# Patient Record
Sex: Female | Born: 1955
Health system: Southern US, Community
[De-identification: ages and names within clinical notes are randomized; demographics above are authoritative.]

## PROBLEM LIST (undated history)

## (undated) DIAGNOSIS — N950 Postmenopausal bleeding: Secondary | ICD-10-CM

## (undated) DIAGNOSIS — Z923 Personal history of irradiation: Secondary | ICD-10-CM

## (undated) DIAGNOSIS — K219 Gastro-esophageal reflux disease without esophagitis: Secondary | ICD-10-CM

## (undated) DIAGNOSIS — Z1211 Encounter for screening for malignant neoplasm of colon: Secondary | ICD-10-CM

## (undated) DIAGNOSIS — J45909 Unspecified asthma, uncomplicated: Secondary | ICD-10-CM

## (undated) DIAGNOSIS — E669 Obesity, unspecified: Secondary | ICD-10-CM

## (undated) DIAGNOSIS — I10 Essential (primary) hypertension: Secondary | ICD-10-CM

## (undated) DIAGNOSIS — C50419 Malignant neoplasm of upper-outer quadrant of unspecified female breast: Secondary | ICD-10-CM

## (undated) DIAGNOSIS — C50919 Malignant neoplasm of unspecified site of unspecified female breast: Secondary | ICD-10-CM

## (undated) HISTORY — DX: Obesity, unspecified: E66.9

## (undated) HISTORY — DX: Encounter for screening for malignant neoplasm of colon: Z12.11

## (undated) HISTORY — DX: Personal history of irradiation: Z92.3

## (undated) HISTORY — DX: Essential (primary) hypertension: I10

## (undated) HISTORY — DX: Postmenopausal bleeding: N95.0

## (undated) HISTORY — DX: Malignant neoplasm of upper-outer quadrant of unspecified female breast: C50.419

## (undated) HISTORY — DX: Unspecified asthma, uncomplicated: J45.909

## (undated) HISTORY — DX: Gastro-esophageal reflux disease without esophagitis: K21.9

## (undated) HISTORY — PX: BREAST SURGERY: SHX581

---

## 2004-04-14 ENCOUNTER — Ambulatory Visit: Payer: Self-pay | Admitting: General Surgery

## 2004-05-09 ENCOUNTER — Ambulatory Visit: Payer: Self-pay | Admitting: General Surgery

## 2006-03-06 DIAGNOSIS — K219 Gastro-esophageal reflux disease without esophagitis: Secondary | ICD-10-CM

## 2006-03-06 HISTORY — PX: APPENDECTOMY: SHX54

## 2006-03-06 HISTORY — DX: Gastro-esophageal reflux disease without esophagitis: K21.9

## 2006-03-15 ENCOUNTER — Ambulatory Visit: Payer: Self-pay | Admitting: Internal Medicine

## 2006-05-30 ENCOUNTER — Ambulatory Visit: Payer: Self-pay | Admitting: Gastroenterology

## 2006-06-11 ENCOUNTER — Ambulatory Visit: Payer: Self-pay | Admitting: General Surgery

## 2006-06-11 ENCOUNTER — Other Ambulatory Visit: Payer: Self-pay

## 2006-06-13 ENCOUNTER — Inpatient Hospital Stay: Payer: Self-pay | Admitting: General Surgery

## 2006-06-13 HISTORY — PX: COLON SURGERY: SHX602

## 2006-07-02 ENCOUNTER — Emergency Department: Payer: Self-pay | Admitting: General Practice

## 2006-07-11 ENCOUNTER — Inpatient Hospital Stay: Payer: Self-pay | Admitting: General Surgery

## 2006-10-01 ENCOUNTER — Ambulatory Visit: Payer: Self-pay | Admitting: General Surgery

## 2007-12-04 ENCOUNTER — Ambulatory Visit: Payer: Self-pay | Admitting: Internal Medicine

## 2008-02-10 ENCOUNTER — Ambulatory Visit: Payer: Self-pay | Admitting: Internal Medicine

## 2008-03-03 ENCOUNTER — Ambulatory Visit: Payer: Self-pay | Admitting: Internal Medicine

## 2008-07-09 ENCOUNTER — Ambulatory Visit: Payer: Self-pay | Admitting: Unknown Physician Specialty

## 2008-09-01 ENCOUNTER — Ambulatory Visit: Payer: Self-pay | Admitting: Unknown Physician Specialty

## 2009-03-06 DIAGNOSIS — C50419 Malignant neoplasm of upper-outer quadrant of unspecified female breast: Secondary | ICD-10-CM

## 2009-03-06 HISTORY — DX: Malignant neoplasm of upper-outer quadrant of unspecified female breast: C50.419

## 2009-03-06 HISTORY — PX: BREAST BIOPSY: SHX20

## 2009-11-16 ENCOUNTER — Ambulatory Visit: Payer: Self-pay | Admitting: Unknown Physician Specialty

## 2009-11-19 ENCOUNTER — Ambulatory Visit: Payer: Self-pay | Admitting: Unknown Physician Specialty

## 2010-01-25 ENCOUNTER — Ambulatory Visit: Payer: Self-pay | Admitting: General Surgery

## 2010-02-03 DIAGNOSIS — C50919 Malignant neoplasm of unspecified site of unspecified female breast: Secondary | ICD-10-CM

## 2010-02-03 HISTORY — DX: Malignant neoplasm of unspecified site of unspecified female breast: C50.919

## 2010-02-03 HISTORY — PX: BREAST LUMPECTOMY: SHX2

## 2010-02-17 ENCOUNTER — Ambulatory Visit: Payer: Self-pay | Admitting: General Surgery

## 2010-02-19 LAB — PATHOLOGY REPORT

## 2010-03-06 DIAGNOSIS — Z923 Personal history of irradiation: Secondary | ICD-10-CM

## 2010-03-06 HISTORY — DX: Personal history of irradiation: Z92.3

## 2010-03-10 ENCOUNTER — Ambulatory Visit: Payer: Self-pay | Admitting: General Surgery

## 2010-03-14 LAB — PATHOLOGY REPORT

## 2010-03-30 ENCOUNTER — Ambulatory Visit: Payer: Self-pay | Admitting: Radiation Oncology

## 2010-04-06 ENCOUNTER — Ambulatory Visit: Payer: Self-pay | Admitting: Radiation Oncology

## 2010-05-05 ENCOUNTER — Ambulatory Visit: Payer: Self-pay | Admitting: Radiation Oncology

## 2010-06-05 ENCOUNTER — Ambulatory Visit: Payer: Self-pay | Admitting: Radiation Oncology

## 2010-07-05 ENCOUNTER — Ambulatory Visit: Payer: Self-pay | Admitting: Radiation Oncology

## 2010-08-05 ENCOUNTER — Ambulatory Visit: Payer: Self-pay | Admitting: Radiation Oncology

## 2010-09-04 ENCOUNTER — Ambulatory Visit: Payer: Self-pay | Admitting: Internal Medicine

## 2011-01-09 ENCOUNTER — Ambulatory Visit: Payer: Self-pay | Admitting: General Surgery

## 2011-02-06 ENCOUNTER — Ambulatory Visit: Payer: Self-pay | Admitting: Internal Medicine

## 2011-03-07 ENCOUNTER — Ambulatory Visit: Payer: Self-pay | Admitting: Internal Medicine

## 2011-03-07 DIAGNOSIS — N950 Postmenopausal bleeding: Secondary | ICD-10-CM

## 2011-03-07 HISTORY — DX: Postmenopausal bleeding: N95.0

## 2011-07-17 ENCOUNTER — Ambulatory Visit: Payer: Self-pay | Admitting: General Surgery

## 2011-09-26 ENCOUNTER — Ambulatory Visit: Payer: Self-pay | Admitting: Internal Medicine

## 2011-09-26 LAB — CBC CANCER CENTER
Basophil #: 0 x10 3/mm (ref 0.0–0.1)
Eosinophil #: 0.2 x10 3/mm (ref 0.0–0.7)
MCH: 29.4 pg (ref 26.0–34.0)
MCHC: 32.6 g/dL (ref 32.0–36.0)
Monocyte #: 0.3 x10 3/mm (ref 0.2–0.9)
Monocyte %: 8.3 %
Neutrophil #: 2.3 x10 3/mm (ref 1.4–6.5)
Neutrophil %: 59.2 %
Platelet: 179 x10 3/mm (ref 150–440)

## 2011-09-26 LAB — HEPATIC FUNCTION PANEL A (ARMC)
Albumin: 3.3 g/dL — ABNORMAL LOW (ref 3.4–5.0)
Bilirubin, Direct: 0.1 mg/dL (ref 0.00–0.20)
Total Protein: 7 g/dL (ref 6.4–8.2)

## 2011-09-26 LAB — CREATININE, SERUM
Creatinine: 0.82 mg/dL (ref 0.60–1.30)
EGFR (African American): >60

## 2011-10-05 ENCOUNTER — Ambulatory Visit: Payer: Self-pay | Admitting: Internal Medicine

## 2012-01-18 ENCOUNTER — Ambulatory Visit: Payer: Self-pay | Admitting: General Surgery

## 2012-02-14 ENCOUNTER — Ambulatory Visit: Payer: Self-pay | Admitting: Obstetrics and Gynecology

## 2012-02-14 LAB — CBC WITH DIFFERENTIAL/PLATELET
Basophil #: 0 10*3/uL (ref 0.0–0.1)
Basophil %: 0.4 %
Eosinophil #: 0.2 10*3/uL (ref 0.0–0.7)
Eosinophil %: 4.2 %
HGB: 12.6 g/dL (ref 12.0–16.0)
Lymphocyte #: 1.2 10*3/uL (ref 1.0–3.6)
Lymphocyte %: 26.2 %
MCH: 29.6 pg (ref 26.0–34.0)
MCHC: 33.7 g/dL (ref 32.0–36.0)
MCV: 88 fL (ref 80–100)
Monocyte #: 0.4 x10 3/mm (ref 0.2–0.9)
Neutrophil %: 59.9 %
RBC: 4.24 10*6/uL (ref 3.80–5.20)
RDW: 13.1 % (ref 11.5–14.5)

## 2012-02-14 LAB — BASIC METABOLIC PANEL
Calcium, Total: 8.6 mg/dL (ref 8.5–10.1)
Chloride: 104 mmol/L (ref 98–107)
Co2: 29 mmol/L (ref 21–32)
Creatinine: 0.71 mg/dL (ref 0.60–1.30)
EGFR (African American): >60
EGFR (Non-African Amer.): >60
Glucose: 84 mg/dL (ref 65–99)
Potassium: 3.6 mmol/L (ref 3.5–5.1)
Sodium: 139 mmol/L (ref 136–145)

## 2012-02-15 ENCOUNTER — Ambulatory Visit: Payer: Self-pay | Admitting: Obstetrics and Gynecology

## 2012-02-19 LAB — PATHOLOGY REPORT

## 2012-03-06 ENCOUNTER — Ambulatory Visit: Payer: Self-pay | Admitting: Internal Medicine

## 2012-03-26 LAB — CBC CANCER CENTER
Basophil %: 0.7 %
Eosinophil %: 4.1 %
HCT: 39 % (ref 35.0–47.0)
HGB: 13.4 g/dL (ref 12.0–16.0)
Lymphocyte %: 28.1 %
MCH: 29.6 pg (ref 26.0–34.0)
MCHC: 34.4 g/dL (ref 32.0–36.0)
Monocyte #: 0.4 x10 3/mm (ref 0.2–0.9)
Neutrophil %: 58.5 %
Platelet: 175 x10 3/mm (ref 150–440)

## 2012-03-26 LAB — CREATININE, SERUM
Creatinine: 0.68 mg/dL (ref 0.60–1.30)
EGFR (African American): >60
EGFR (Non-African Amer.): >60

## 2012-03-26 LAB — HEPATIC FUNCTION PANEL A (ARMC)
Albumin: 3.4 g/dL (ref 3.4–5.0)
Bilirubin, Direct: 0.1 mg/dL (ref 0.00–0.20)
Bilirubin,Total: 0.4 mg/dL (ref 0.2–1.0)
SGOT(AST): 18 U/L (ref 15–37)
SGPT (ALT): 19 U/L (ref 12–78)
Total Protein: 7.3 g/dL (ref 6.4–8.2)

## 2012-04-06 ENCOUNTER — Ambulatory Visit: Payer: Self-pay | Admitting: Internal Medicine

## 2012-05-15 ENCOUNTER — Ambulatory Visit: Payer: Self-pay | Admitting: Physician Assistant

## 2012-08-02 ENCOUNTER — Other Ambulatory Visit: Payer: Self-pay | Admitting: General Surgery

## 2012-08-20 ENCOUNTER — Encounter: Payer: Self-pay | Admitting: *Deleted

## 2012-08-20 DIAGNOSIS — C50419 Malignant neoplasm of upper-outer quadrant of unspecified female breast: Secondary | ICD-10-CM | POA: Insufficient documentation

## 2012-09-11 ENCOUNTER — Ambulatory Visit: Payer: Self-pay | Admitting: Internal Medicine

## 2012-10-27 ENCOUNTER — Other Ambulatory Visit: Payer: Self-pay | Admitting: General Surgery

## 2013-01-20 ENCOUNTER — Ambulatory Visit: Payer: Self-pay | Admitting: General Surgery

## 2013-01-21 ENCOUNTER — Encounter: Payer: Self-pay | Admitting: General Surgery

## 2013-02-11 ENCOUNTER — Encounter: Payer: Self-pay | Admitting: General Surgery

## 2013-02-11 ENCOUNTER — Ambulatory Visit (INDEPENDENT_AMBULATORY_CARE_PROVIDER_SITE_OTHER): Payer: BC Managed Care – PPO | Admitting: General Surgery

## 2013-02-11 VITALS — BP 136/80 | HR 72 | Resp 14 | Ht 59.0 in | Wt 162.0 lb

## 2013-02-11 DIAGNOSIS — Z853 Personal history of malignant neoplasm of breast: Secondary | ICD-10-CM

## 2013-02-11 NOTE — Patient Instructions (Addendum)
Patient to return in 6 months bilateral diagnotic mammogram 

## 2013-02-11 NOTE — Progress Notes (Signed)
Patient ID: Stacie Ellis, female   DOB: 1955-08-22, 57 y.o.   MRN: [id number]  Chief Complaint  Patient presents with  . Follow-up    mammogram    HPI Stacie Ellis is a 57 y.o. female who presents for a breast evaluation. The most recent mammogram was done on 01/20/13.Patient does perform regular self breast checks and gets regular mammograms done.  Patient states she is having lots of tenderness in her left breast .Patient states in the last six months she has had  more  hot flashes while on tamoxifen .  HPI  Past Medical History  Diagnosis Date  . Hypertension   . Postmenopausal bleeding 2013  . GERD (gastroesophageal reflux disease) 2008  . Obesity, unspecified   . Special screening for malignant neoplasms, colon   . Malignant neoplasm of upper-outer quadrant of female breast 2011    left breast lumpectomy, wide excision and a repeat wide excision on 03/10/2010 for multiple positive margins on original resection  . S/P radiation therapy 2012    whole breast,   . Malignant neoplasm of upper-outer quadrant of female breast 2011    left breast  . Asthma     Past Surgical History  Procedure Laterality Date  . Cesarean section    . Breast surgery Left 02/17/2010;03/10/2010    lumpectomy and repeat wide excision  . Appendectomy  2008  . Colonoscopy  2010    Dr. Mechele Collin  . Colon surgery Right 2008    hemicolectomy/polyp    Family History  Problem Relation Age of Onset  . Cancer Father     colon  . Cancer Other     unknown family member with breast cancer  . Colon polyps Other     unknown family member with colon polyps    Social History History  Substance Use Topics  . Smoking status: Never Smoker   . Smokeless tobacco: Never Used  . Alcohol Use: No    Allergies  Allergen Reactions  . Levaquin [Levofloxacin] Other (See Comments)    Severe tightness in left leg  . Penicillins Rash    Current Outpatient Prescriptions  Medication Sig Dispense Refill   . aspirin 81 MG tablet Take 81 mg by mouth daily.      Marland Kitchen loratadine (CLARITIN) 10 MG tablet Take 10 mg by mouth daily.      . pantoprazole (PROTONIX) 20 MG tablet Take 20 mg by mouth daily.      . tamoxifen (NOLVADEX) 20 MG tablet take 1 tablet by mouth once daily  90 tablet  4  . valsartan-hydrochlorothiazide (DIOVAN-HCT) 160-12.5 MG per tablet Take 1 tablet by mouth daily.       No current facility-administered medications for this visit.    Review of Systems Review of Systems  Constitutional: Negative.   Respiratory: Negative.   Cardiovascular: Negative.     Blood pressure 136/80, pulse 72, resp. rate 14, height 4\' 11"  (1.499 m), weight 162 lb (73.483 kg).  Physical Exam Physical Exam  Constitutional: She is oriented to person, place, and time. She appears well-developed and well-nourished.  Eyes: No scleral icterus.  Cardiovascular: Normal rate, regular rhythm and normal heart sounds.   Pulmonary/Chest: Breath sounds normal. Right breast exhibits no inverted nipple, no mass, no nipple discharge, no skin change and no tenderness. Left breast exhibits no inverted nipple, no mass, no nipple discharge, no skin change and no tenderness. Breasts are asymmetrical (left breast 2 cups size smaller than right.).  Thickening on  the left anterior  fold.Well healed left breast scar at 3 o'clock.  Abdominal: Soft. Bowel sounds are normal. There is no tenderness.  Lymphadenopathy:    She has no cervical adenopathy.    She has no axillary adenopathy.  Neurological: She is alert and oriented to person, place, and time.  Skin: Skin is warm and dry.    Data Reviewed Motor on mammograms on January 20, 2013 showed a right breast unremarkable. Calcifications and left breast were appreciated and recommended for a 6 month followup. BI-RAD-3. These are likely dystrophic on my review the films secondary to her previous surgery and radiation.  Assessment    The patient reports increasing vasomotor  instability.   New calcifications in the left breast likely related to radiation.     Plan    A 6 month followup left breast mammogram was recommended by the radiologist and is felt to be reasonable.  Her new vasomotor symptoms the last 6 months may be improved with the use of Effexor. She'll notify the office if she would like to make use of a trial.       Earline Mayotte 02/11/2013, 9:20 PM

## 2013-02-13 ENCOUNTER — Ambulatory Visit: Payer: Self-pay | Admitting: General Surgery

## 2013-03-06 HISTORY — PX: COLONOSCOPY: SHX174

## 2013-03-11 ENCOUNTER — Encounter: Payer: Self-pay | Admitting: General Surgery

## 2013-06-23 ENCOUNTER — Ambulatory Visit: Payer: Self-pay | Admitting: Internal Medicine

## 2013-06-24 LAB — CBC CANCER CENTER
Basophil #: 0 x10 3/mm (ref 0.0–0.1)
Basophil %: 0.5 %
EOS PCT: 2.3 %
Eosinophil #: 0.1 x10 3/mm (ref 0.0–0.7)
HCT: 38.8 % (ref 35.0–47.0)
HGB: 12.6 g/dL (ref 12.0–16.0)
LYMPHS ABS: 1.6 x10 3/mm (ref 1.0–3.6)
Lymphocyte %: 30.9 %
MCH: 28.1 pg (ref 26.0–34.0)
MCHC: 32.4 g/dL (ref 32.0–36.0)
MCV: 87 fL (ref 80–100)
Monocyte #: 0.4 x10 3/mm (ref 0.2–0.9)
Monocyte %: 8.3 %
NEUTROS PCT: 58 %
Neutrophil #: 3 x10 3/mm (ref 1.4–6.5)
PLATELETS: 198 x10 3/mm (ref 150–440)
RBC: 4.48 10*6/uL (ref 3.80–5.20)
RDW: 13.4 % (ref 11.5–14.5)
WBC: 5.2 x10 3/mm (ref 3.6–11.0)

## 2013-06-24 LAB — CREATININE, SERUM: Creatinine: 0.85 mg/dL (ref 0.60–1.30)

## 2013-06-24 LAB — HEPATIC FUNCTION PANEL A (ARMC)
AST: 14 U/L — AB (ref 15–37)
Albumin: 3 g/dL — ABNORMAL LOW (ref 3.4–5.0)
Alkaline Phosphatase: 57 U/L
BILIRUBIN DIRECT: 0.1 mg/dL (ref 0.00–0.20)
Bilirubin,Total: 0.3 mg/dL (ref 0.2–1.0)
SGPT (ALT): 17 U/L (ref 12–78)
TOTAL PROTEIN: 6.8 g/dL (ref 6.4–8.2)

## 2013-07-04 ENCOUNTER — Ambulatory Visit: Payer: Self-pay | Admitting: Internal Medicine

## 2013-08-07 ENCOUNTER — Ambulatory Visit (INDEPENDENT_AMBULATORY_CARE_PROVIDER_SITE_OTHER): Payer: BC Managed Care – PPO | Admitting: General Surgery

## 2013-08-07 ENCOUNTER — Encounter: Payer: Self-pay | Admitting: General Surgery

## 2013-08-07 VITALS — BP 130/72 | HR 74 | Resp 12 | Ht 59.0 in | Wt 163.0 lb

## 2013-08-07 DIAGNOSIS — Z86010 Personal history of colonic polyps: Secondary | ICD-10-CM

## 2013-08-07 DIAGNOSIS — Z1211 Encounter for screening for malignant neoplasm of colon: Secondary | ICD-10-CM

## 2013-08-07 MED ORDER — POLYETHYLENE GLYCOL 3350 17 GM/SCOOP PO POWD: 1.0000 | Freq: Once | ORAL | Status: DC

## 2013-08-07 NOTE — Progress Notes (Signed)
Patient ID: Stacie Ellis, female   DOB: 1955-07-14, 58 y.o.   MRN: [id number]  Chief Complaint  Patient presents with  . Other    colonoscopy    HPI Stacie Ellis is a 58 y.o. female here for colonoscopy discussion. This was last done by Dr Vira Agar 5 years ago. No GI problems HPI  Past Medical History  Diagnosis Date  . Hypertension   . Postmenopausal bleeding 2013  . GERD (gastroesophageal reflux disease) 2008  . Obesity, unspecified   . Special screening for malignant neoplasms, colon   . Malignant neoplasm of upper-outer quadrant of female breast 2011    left breast lumpectomy, wide excision and a repeat wide excision on 03/10/2010 for multiple positive margins on original resection  . S/P radiation therapy 2012    whole breast,   . Malignant neoplasm of upper-outer quadrant of female breast 2011    left breast  . Asthma     Past Surgical History  Procedure Laterality Date  . Cesarean section    . Breast surgery Left 02/17/2010;03/10/2010    lumpectomy and repeat wide excision  . Appendectomy  2008  . Colonoscopy  2010    Dr. Vira Agar  . Colon surgery Right 2008    hemicolectomy/polyp villous adenoma without atypia    Family History  Problem Relation Age of Onset  . Cancer Father     colon  . Cancer Other     unknown family member with breast cancer  . Colon polyps Other     unknown family member with colon polyps    Social History History  Substance Use Topics  . Smoking status: Never Smoker   . Smokeless tobacco: Never Used  . Alcohol Use: No    Allergies  Allergen Reactions  . Levaquin [Levofloxacin] Other (See Comments)    Severe tightness in left leg  . Penicillins Rash    Current Outpatient Prescriptions  Medication Sig Dispense Refill  . aspirin 81 MG tablet Take 81 mg by mouth daily.      Marland Kitchen loratadine (CLARITIN) 10 MG tablet Take 10 mg by mouth daily.      . metoprolol (LOPRESSOR) 50 MG tablet Take 50 mg by mouth 2 (two) times  daily.      . pantoprazole (PROTONIX) 20 MG tablet Take 20 mg by mouth daily.      . tamoxifen (NOLVADEX) 20 MG tablet take 1 tablet by mouth once daily  90 tablet  4  . valsartan-hydrochlorothiazide (DIOVAN-HCT) 160-12.5 MG per tablet Take 1 tablet by mouth daily at 2 PM daily at 2 PM.       . polyethylene glycol powder (GLYCOLAX/MIRALAX) powder Take 255 g (1 Container total) by mouth once.  255 g  0   No current facility-administered medications for this visit.    Review of Systems Review of Systems  Constitutional: Negative.   Respiratory: Negative.   Gastrointestinal: Negative.     Blood pressure 130/72, pulse 74, resp. rate 12, height 4\' 11"  (1.499 m), weight 163 lb (73.936 kg).  Physical Exam Physical Exam  Constitutional: She is oriented to person, place, and time. She appears well-developed and well-nourished.  Eyes: Conjunctivae are normal. No scleral icterus.  Neck: Neck supple.  Cardiovascular: Normal rate, regular rhythm and normal heart sounds.   Pulmonary/Chest: Effort normal and breath sounds normal.  Abdominal: Soft. Normal appearance and bowel sounds are normal. There is no hepatomegaly. There is no tenderness. No hernia.  Neurological: She is alert and  oriented to person, place, and time.  Skin: Skin is warm and dry.    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 -NEGATIVE FOR HIGH GRADE DYSPLASIA AND MALIGNANCY. -SEVENTEEN(17) LYMPH NODES, NEGATIVE FOR MALIGNANCY (0/17). -APPENDIX, NEGATIVE FOR DYSPLASIA AND MALIGNANCY. -PROXIMAL AND DISTAL MUCOSAL MARGINS, NEGATIVE FOR DYSPLASIA AND MALIGNANCY. -RADIAL MARGIN, NEGATIVE FOR MALIGNANCY. Colonoscopy complete September 01, 2008 by Gaylyn Cheers, M.D. Was normal.  Assessment    Candidate for followup colonoscopy.     Plan    The pros and cons of the procedure were reviewed. Risks associated with scope passage including bleeding and perforation were  discussed.     Patient is scheduled for a Colonoscopy at [facility] on 09/03/13. She will only take her Metoprolol the morning of at 6 am with a small sip of water. Patient is aware to pre register with the hospital at least 2 days prior. Miralax prescription has been sent into her pharmacy. Patient is aware of date and instructions.   Ref: Dr Meyer Cory Maritza [place] 08/08/2013, 10:09 PM

## 2013-08-07 NOTE — Patient Instructions (Addendum)
Colonoscopy A colonoscopy is an exam to look at the entire large intestine (colon). This exam can help find problems such as tumors, polyps, inflammation, and areas of bleeding. The exam takes about 1 hour.  LET [facility] CARE PROVIDER KNOW ABOUT:   Any allergies you have.  All medicines you are taking, including vitamins, herbs, eye drops, creams, and over-the-counter medicines.  Previous problems you or members of your family have had with the use of anesthetics.  Any blood disorders you have.  Previous surgeries you have had.  Medical conditions you have. RISKS AND COMPLICATIONS  Generally, this is a safe procedure. However, as with any procedure, complications can occur. Possible complications include:  Bleeding.  Tearing or rupture of the colon wall.  Reaction to medicines given during the exam.  Infection (rare). BEFORE THE PROCEDURE   Ask your health care provider about changing or stopping your regular medicines.  You may be prescribed an oral bowel prep. This involves drinking a large amount of medicated liquid, starting the day before your procedure. The liquid will cause you to have multiple loose stools until your stool is almost clear or light green. This cleans out your colon in preparation for the procedure.  Do not eat or drink anything else once you have started the bowel prep, unless your health care provider tells you it is safe to do so.  Arrange for someone to drive you home after the procedure. PROCEDURE   You will be given medicine to help you relax (sedative).  You will lie on your side with your knees bent.  A long, flexible tube with a light and camera on the end (colonoscope) will be inserted through the rectum and into the colon. The camera sends video back to a computer screen as it moves through the colon. The colonoscope also releases carbon dioxide gas to inflate the colon. This helps your health care provider see the area better.  During  the exam, your health care provider may take a small tissue sample (biopsy) to be examined under a microscope if any abnormalities are found.  The exam is finished when the entire colon has been viewed. AFTER THE PROCEDURE   Do not drive for 24 hours after the exam.  You may have a small amount of blood in your stool.  You may pass moderate amounts of gas and have mild abdominal cramping or bloating. This is caused by the gas used to inflate your colon during the exam.  Ask when your test results will be ready and how you will get your results. Make sure you get your test results. Document Released: 02/18/2000 Document Revised: 12/11/2012 Document Reviewed: 10/28/2012 [facility] Patient Information 2014 [place].  Patient is scheduled for a Colonoscopy at [facility] on 09/03/13. She will only take her Metoprolol the morning of at 6 am with a small sip of water. Patient is aware to pre register with the hospital at least 2 days prior. Miralax prescription has been sent into her pharmacy. Patient is aware of date and instructions.

## 2013-08-08 ENCOUNTER — Encounter: Payer: Self-pay | Admitting: General Surgery

## 2013-08-08 DIAGNOSIS — Z0001 Encounter for general adult medical examination with abnormal findings: Secondary | ICD-10-CM | POA: Insufficient documentation

## 2013-08-08 DIAGNOSIS — Z860100 Personal history of colon polyps, unspecified: Secondary | ICD-10-CM | POA: Insufficient documentation

## 2013-08-08 DIAGNOSIS — Z1211 Encounter for screening for malignant neoplasm of colon: Principal | ICD-10-CM

## 2013-08-20 ENCOUNTER — Other Ambulatory Visit: Payer: Self-pay | Admitting: General Surgery

## 2013-08-20 DIAGNOSIS — Z1211 Encounter for screening for malignant neoplasm of colon: Secondary | ICD-10-CM

## 2013-08-26 ENCOUNTER — Encounter: Payer: Self-pay | Admitting: General Surgery

## 2013-09-01 ENCOUNTER — Telehealth: Payer: Self-pay

## 2013-09-01 NOTE — Telephone Encounter (Signed)
Patient called to reschedule her colonoscopy scheduled for 09/03/13 due to a family emergency. Patient is now scheduled for colonoscopy at [facility] on 09/09/13. She is aware that she may drink clear liquids up until 8 am that morning. All other instructions remain the same. Patient is aware of date and instructions.

## 2013-09-02 ENCOUNTER — Ambulatory Visit: Payer: BC Managed Care – PPO | Admitting: General Surgery

## 2013-09-04 ENCOUNTER — Telehealth: Payer: Self-pay | Admitting: *Deleted

## 2013-09-04 NOTE — Telephone Encounter (Signed)
Patient called the office back to report that she has had no change in medications since her last office visit. She reports that she has pre-registered but has yet to pick up the Miralax prescription.  We will proceed with colonoscopy that is scheduled at [facility] for 09-09-13.

## 2013-09-04 NOTE — Telephone Encounter (Signed)
Message left on home and cell numbers for patient to call the office.   Patient is scheduled for a colonoscopy on 09-09-13 at [facility]. We need to verify that patient's medications have not changed since last office visit. Also, need to verify she has pre-registered and has Miralax prescription.

## 2013-09-09 ENCOUNTER — Ambulatory Visit: Payer: Self-pay | Admitting: General Surgery

## 2013-09-09 DIAGNOSIS — Z86010 Personal history of colonic polyps: Secondary | ICD-10-CM

## 2013-09-10 ENCOUNTER — Ambulatory Visit: Payer: BC Managed Care – PPO | Admitting: General Surgery

## 2013-09-10 ENCOUNTER — Encounter: Payer: Self-pay | Admitting: General Surgery

## 2013-09-10 ENCOUNTER — Telehealth: Payer: Self-pay | Admitting: *Deleted

## 2013-09-10 NOTE — Telephone Encounter (Signed)
Post colonoscopy. She states she is doing well. She had a follow up mammogram appointment for 09-10-13. Does she need to reschedule this?

## 2013-09-10 NOTE — Progress Notes (Deleted)
Patient ID: Stacie Ellis, female   DOB: [birth year], 58 y.o.   MRN: [id number]  Chief Complaint  Patient presents with  . Follow-up    mammogram    HPI [name] is a 58 y.o. female   HPI  Past Medical History  Diagnosis Date  . Hypertension   . Postmenopausal bleeding 2013  . GERD (gastroesophageal reflux disease) 2008  . Obesity, unspecified   . Special screening for malignant neoplasms, colon   . Malignant neoplasm of upper-outer quadrant of female breast 2011    left breast lumpectomy, wide excision and a repeat wide excision on 03/10/2010 for multiple positive margins on original resection  . S/P radiation therapy 2012    whole breast,   . Malignant neoplasm of upper-outer quadrant of female breast 2011    left breast  . Asthma     Past Surgical History  Procedure Laterality Date  . Cesarean section    . Breast surgery Left 02/17/2010;03/10/2010    lumpectomy and repeat wide excision  . Appendectomy  2008  . Colonoscopy  2010    Dr. Vira Agar  . Colon surgery Right 2008    hemicolectomy/polyp villous adenoma without atypia    Family History  Problem Relation Age of Onset  . Cancer Father     colon  . Cancer Other     unknown family member with breast cancer  . Colon polyps Other     unknown family member with colon polyps    Social History History  Substance Use Topics  . Smoking status: Never Smoker   . Smokeless tobacco: Never Used  . Alcohol Use: No    Allergies  Allergen Reactions  . Levaquin [Levofloxacin] Other (See Comments)    Severe tightness in left leg  . Penicillins Rash    Current Outpatient Prescriptions  Medication Sig Dispense Refill  . aspirin 81 MG tablet Take 81 mg by mouth daily.      Marland Kitchen loratadine (CLARITIN) 10 MG tablet Take 10 mg by mouth daily.      . metoprolol (LOPRESSOR) 50 MG tablet Take 50 mg by mouth 2 (two) times daily.      . pantoprazole (PROTONIX) 20 MG tablet Take 20 mg by mouth daily.      .  polyethylene glycol powder (GLYCOLAX/MIRALAX) powder Take 255 g (1 Container total) by mouth once.  255 g  0  . tamoxifen (NOLVADEX) 20 MG tablet take 1 tablet by mouth once daily  90 tablet  4  . valsartan-hydrochlorothiazide (DIOVAN-HCT) 160-12.5 MG per tablet Take 1 tablet by mouth daily at 2 PM daily at 2 PM.        No current facility-administered medications for this visit.    Review of Systems Review of Systems  Constitutional: Negative.   Respiratory: Negative.   Cardiovascular: Negative.     There were no vitals taken for this visit.  Physical Exam Physical Exam  Constitutional: She is oriented to person, place, and time. She appears well-developed and well-nourished.  Eyes: Conjunctivae are normal. No scleral icterus.  Neck: Neck supple.  Cardiovascular: Normal rate, regular rhythm and normal heart sounds.   Pulmonary/Chest: Effort normal and breath sounds normal.  Lymphadenopathy:    She has no cervical adenopathy.    She has no axillary adenopathy.  Neurological: She is alert and oriented to person, place, and time.  Skin: Skin is warm and dry.    Data Reviewed ***  Assessment    ***  Plan    ***        Gaspar Cola 09/10/2013, 11:13 AM

## 2013-09-10 NOTE — Progress Notes (Signed)
This encounter was created in error - please disregard.

## 2013-09-25 ENCOUNTER — Encounter: Payer: Self-pay | Admitting: *Deleted

## 2013-10-06 ENCOUNTER — Encounter: Payer: Self-pay | Admitting: General Surgery

## 2013-10-30 ENCOUNTER — Other Ambulatory Visit: Payer: Self-pay | Admitting: General Surgery

## 2014-01-05 ENCOUNTER — Encounter: Payer: Self-pay | Admitting: General Surgery

## 2014-02-17 ENCOUNTER — Encounter: Payer: Self-pay | Admitting: General Surgery

## 2014-02-19 ENCOUNTER — Ambulatory Visit: Payer: BC Managed Care – PPO | Admitting: General Surgery

## 2014-03-03 ENCOUNTER — Ambulatory Visit: Payer: BC Managed Care – PPO | Admitting: General Surgery

## 2014-03-04 ENCOUNTER — Encounter: Payer: Self-pay | Admitting: *Deleted

## 2014-03-19 ENCOUNTER — Ambulatory Visit (INDEPENDENT_AMBULATORY_CARE_PROVIDER_SITE_OTHER): Payer: BLUE CROSS/BLUE SHIELD | Admitting: General Surgery

## 2014-03-19 ENCOUNTER — Encounter: Payer: Self-pay | Admitting: General Surgery

## 2014-03-19 VITALS — BP 120/82 | HR 80 | Resp 14 | Ht 59.0 in | Wt 163.0 lb

## 2014-03-19 DIAGNOSIS — Z853 Personal history of malignant neoplasm of breast: Secondary | ICD-10-CM

## 2014-03-19 DIAGNOSIS — L989 Disorder of the skin and subcutaneous tissue, unspecified: Secondary | ICD-10-CM

## 2014-03-19 NOTE — Progress Notes (Signed)
Patient ID: Stacie Ellis, female   DOB: 12/26/1955, 59 y.o.   MRN: [id number]  Chief Complaint  Patient presents with  . Follow-up    mammogram    HPI Stacie Ellis is a 59 y.o. female who presents for a breast evaluation. The most recent mammogram was done on 02/18/15  Patient does perform regular self breast checks and gets regular mammograms done.  No new breast complaints.  She states she is tolerating Tamoxifen fairly well.  HPI  Past Medical History  Diagnosis Date  . Hypertension   . Postmenopausal bleeding 2013  . GERD (gastroesophageal reflux disease) 2008  . Obesity, unspecified   . Special screening for malignant neoplasms, colon   . 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 left breast lumpectomy, wide excision and a repeat wide excision on 03/10/2010 for multiple positive margins on original resection  . S/P radiation therapy 2012    whole breast,   . Malignant neoplasm of upper-outer quadrant of female breast January 2012:     Re-excision to negative margins.   . Asthma     Past Surgical History  Procedure Laterality Date  . Cesarean section    . Breast surgery Left 02/17/2010;03/10/2010    lumpectomy and repeat wide excision  . Appendectomy  2008  . Colonoscopy  2010    Dr. Vira Agar  . Colon surgery Right 2008    hemicolectomy/polyp villous adenoma without atypia    Family History  Problem Relation Age of Onset  . Cancer Father     colon  . Cancer Other     unknown family member with breast cancer  . Colon polyps Other     unknown family member with colon polyps    Social History History  Substance Use Topics  . Smoking status: Never Smoker   . Smokeless tobacco: Never Used  . Alcohol Use: No    Allergies  Allergen Reactions  . Levaquin [Levofloxacin] Other (See Comments)    Severe tightness in left leg  . Penicillins Rash    Current Outpatient Prescriptions   Medication Sig Dispense Refill  . aspirin 81 MG tablet Take 81 mg by mouth daily.    Marland Kitchen loratadine (CLARITIN) 10 MG tablet Take 10 mg by mouth daily.    . metoprolol (LOPRESSOR) 50 MG tablet Take 100 mg by mouth daily.     . pantoprazole (PROTONIX) 20 MG tablet Take 20 mg by mouth daily.    . tamoxifen (NOLVADEX) 20 MG tablet take 1 tablet by mouth once daily 90 tablet 4  . valsartan-hydrochlorothiazide (DIOVAN-HCT) 160-12.5 MG per tablet Take 1 tablet by mouth daily at 2 PM daily at 2 PM.      No current facility-administered medications for this visit.    Review of Systems Review of Systems  Constitutional: Negative.   Respiratory: Negative.   Cardiovascular: Negative.     Blood pressure 120/82, pulse 80, resp. rate 14, height 4\' 11"  (1.499 m), weight 163 lb (73.936 kg).  Physical Exam Physical Exam  Constitutional: She is oriented to person, place, and time. She appears well-developed and well-nourished.  Eyes: Conjunctivae are normal. No scleral icterus.  Neck: Neck supple.  Cardiovascular: Normal rate, regular rhythm and normal heart sounds.   Pulmonary/Chest: Effort normal and breath sounds normal. Right breast exhibits no inverted nipple, no mass, no nipple discharge, no skin change and no tenderness. Left breast exhibits no inverted nipple, no  mass, no nipple discharge, no skin change and no tenderness.    Tender bottom left axilla area. Well healed incision at 3 o'clock left breast. 6 mm salmon color slightly irregular at 2 o'clock 6 CFN slightly raised.  Abdominal: Soft. Bowel sounds are normal. There is no tenderness.  Lymphadenopathy:    She has no cervical adenopathy.    She has no axillary adenopathy.  Neurological: She is alert and oriented to person, place, and time.  Skin: Skin is warm and dry.    Data Reviewed Bilateral diagnostic mammograms dated 02/17/2014 were reviewed. No interval change. BI-RADS-2.  Assessment    Benign breast exam.  Stable  mammogram.  New skin lesion of the left anterior chest wall, question basal cell versus amelanotic melanoma.     Plan    The patient will either have Sarina Ser, M.D. her dermatologist evaluate this or recurrent here for excision.  The patient has been asked to return to the office in one year with a bilateral diagnostic mammogram.     PCP:  Elmarie Shiley, [place] Gleason 03/20/2014, 6:05 PM

## 2014-03-19 NOTE — Patient Instructions (Addendum)
Continue self breast exams. Call office for any new breast issues or concerns. The patient has been asked to return to the office in one year with a bilateral diagnostic mammogram.

## 2014-03-20 DIAGNOSIS — L989 Disorder of the skin and subcutaneous tissue, unspecified: Secondary | ICD-10-CM | POA: Insufficient documentation

## 2014-06-29 DIAGNOSIS — Z87898 Personal history of other specified conditions: Secondary | ICD-10-CM | POA: Insufficient documentation

## 2014-06-29 DIAGNOSIS — R079 Chest pain, unspecified: Secondary | ICD-10-CM | POA: Insufficient documentation

## 2014-08-03 ENCOUNTER — Other Ambulatory Visit: Payer: Self-pay | Admitting: *Deleted

## 2014-08-03 DIAGNOSIS — D0512 Intraductal carcinoma in situ of left breast: Secondary | ICD-10-CM

## 2014-08-05 ENCOUNTER — Ambulatory Visit: Payer: Self-pay | Admitting: Internal Medicine

## 2014-08-05 ENCOUNTER — Other Ambulatory Visit: Payer: Self-pay

## 2014-08-05 ENCOUNTER — Inpatient Hospital Stay (HOSPITAL_BASED_OUTPATIENT_CLINIC_OR_DEPARTMENT_OTHER): Payer: BLUE CROSS/BLUE SHIELD | Admitting: Internal Medicine

## 2014-08-05 ENCOUNTER — Inpatient Hospital Stay: Payer: BLUE CROSS/BLUE SHIELD | Attending: Internal Medicine

## 2014-08-05 VITALS — BP 138/83 | HR 82 | Temp 97.2°F | Ht 60.0 in | Wt 160.9 lb

## 2014-08-05 DIAGNOSIS — Z809 Family history of malignant neoplasm, unspecified: Secondary | ICD-10-CM

## 2014-08-05 DIAGNOSIS — Z170 Estrogen receptor positive status [ER+]: Secondary | ICD-10-CM | POA: Diagnosis not present

## 2014-08-05 DIAGNOSIS — Z7982 Long term (current) use of aspirin: Secondary | ICD-10-CM | POA: Insufficient documentation

## 2014-08-05 DIAGNOSIS — E669 Obesity, unspecified: Secondary | ICD-10-CM

## 2014-08-05 DIAGNOSIS — Z79899 Other long term (current) drug therapy: Secondary | ICD-10-CM | POA: Diagnosis not present

## 2014-08-05 DIAGNOSIS — Z79810 Long term (current) use of selective estrogen receptor modulators (SERMs): Secondary | ICD-10-CM | POA: Insufficient documentation

## 2014-08-05 DIAGNOSIS — I10 Essential (primary) hypertension: Secondary | ICD-10-CM | POA: Insufficient documentation

## 2014-08-05 DIAGNOSIS — D0512 Intraductal carcinoma in situ of left breast: Secondary | ICD-10-CM

## 2014-08-05 DIAGNOSIS — K589 Irritable bowel syndrome without diarrhea: Secondary | ICD-10-CM | POA: Insufficient documentation

## 2014-08-05 DIAGNOSIS — K219 Gastro-esophageal reflux disease without esophagitis: Secondary | ICD-10-CM

## 2014-08-05 DIAGNOSIS — J45909 Unspecified asthma, uncomplicated: Secondary | ICD-10-CM | POA: Insufficient documentation

## 2014-08-05 LAB — HEPATIC FUNCTION PANEL
ALBUMIN: 3.4 g/dL — AB (ref 3.5–5.0)
ALT: 15 U/L (ref 14–54)
AST: 18 U/L (ref 15–41)
Alkaline Phosphatase: 45 U/L (ref 38–126)
Total Bilirubin: 0.5 mg/dL (ref 0.3–1.2)
Total Protein: 6.8 g/dL (ref 6.5–8.1)

## 2014-08-05 LAB — CBC WITH DIFFERENTIAL/PLATELET
Basophils Absolute: 0 10*3/uL (ref 0–0.1)
Basophils Relative: 0 %
EOS PCT: 2 %
Eosinophils Absolute: 0.1 10*3/uL (ref 0–0.7)
HCT: 36.7 % (ref 35.0–47.0)
Hemoglobin: 12.1 g/dL (ref 12.0–16.0)
Lymphocytes Relative: 35 %
Lymphs Abs: 2 10*3/uL (ref 1.0–3.6)
MCH: 27.8 pg (ref 26.0–34.0)
MCHC: 33.1 g/dL (ref 32.0–36.0)
MCV: 84.1 fL (ref 80.0–100.0)
Monocytes Absolute: 0.5 10*3/uL (ref 0.2–0.9)
Monocytes Relative: 9 %
NEUTROS PCT: 54 %
Neutro Abs: 3.1 10*3/uL (ref 1.4–6.5)
PLATELETS: 178 10*3/uL (ref 150–440)
RBC: 4.37 MIL/uL (ref 3.80–5.20)
RDW: 13.2 % (ref 11.5–14.5)
WBC: 5.7 10*3/uL (ref 3.6–11.0)

## 2014-08-05 LAB — CREATININE, SERUM
CREATININE: 0.54 mg/dL (ref 0.44–1.00)
GFR calc Af Amer: >60 mL/min (ref >60)

## 2014-08-23 NOTE — Progress Notes (Signed)
[place]  [phone] [phone]     ID: Stacie Ellis OB: [birth year]  MR#: [id number]  FHL#:[id number]  Patient Care Team: Lavera Guise, MD as PCP - General (Internal Medicine) Robert Bellow, MD (General Surgery) Eusebio Me, MD (Unknown Physician Specialty)  CHIEF COMPLAINT/DIAGNOSIS:  59 year old female with pathologic stage 0 DCIS (Tis N0 M0) of left breast , grade 2, comedo status post wide local excision in December 2011 with positive margins, then had reexcision on 03/10/10 with no residual DCIS found.  ER/PR positive. Started Tamoxifen in June 2012.   HISTORY OF PRESENT ILLNESS:  Patient returns for oncology followup, she was seen in January 2014. Overall states that she is doing steady, denies new complaints. States she is tolerating tamoxifen well without side effects. She took lovenox injections for prophylaxis of DVT when she goes on long plane trips (either to [employer] or internationally) but states recently she has been going on short plane rides only and ambulates frequently in-flight and does not want to take lovenox for these flights. Denies feeling new breast masses on self-exam. Appetite and weight steady. States her last mammogram in Dec 2015 was unremarkable. No new mood disturbances. No new bone pains, 0/10.    REVIEW OF SYSTEMS:   ROS As in HPI above. In addition, no fever, chills or sweats. No new headaches or focal weakness.  No new mood disturbances. No  sore throat, cough, shortness of breath, sputum, hemoptysis or chest pain. No dizziness or palpitation. No abdominal pain, constipation, diarrhea, dysuria or hematuria. No new skin rash or bleeding symptoms. No new paresthesias in extremities. PS ECOG 0.  PAST MEDICAL HISTORY: Reviewed Past Medical History  Diagnosis Date  . Hypertension   . Postmenopausal bleeding 2013  . GERD (gastroesophageal reflux disease) 2008  . Obesity, unspecified   . Special  screening for malignant neoplasms, colon   . 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 left breast lumpectomy, wide excision and a repeat wide excision on 03/10/2010 for multiple positive margins on original resection  . S/P radiation therapy 2012    whole breast,   . Malignant neoplasm of upper-outer quadrant of female breast January 2012:     Re-excision to negative margins.   . Asthma           Hypertension  GERD  Glaucoma  Irritable bowel syndrome  Right hemicolectomy 2008, denies cancer.  Last colonoscopy 2010.  C-section  DCIS (Tis N0 M0) of left breast , grade 2, comedo status post wide local excision in December 2011 with positive margins, then had reexcision on 03/10/10 with no residual DCIS found.  ER/PR positive  Status post endometrial biopsy on 07/08/2010 which showed inactive endometrium tissue with no hyperplasia or carcinoma  PAST SURGICAL HISTORY:Reviewed Past Surgical History  Procedure Laterality Date  . Cesarean section    . Breast surgery Left 02/17/2010;03/10/2010    lumpectomy and repeat wide excision  . Appendectomy  2008  . Colonoscopy  2010    Dr. Vira Agar  . Colon surgery Right 2008    hemicolectomy/polyp villous adenoma without atypia    FAMILY HISTORY:Reviewed Family History  Problem Relation Age of Onset  . Cancer Father     colon  . Cancer Other     unknown family member with breast cancer  . Colon polyps Other     unknown family member with colon polyps  Father with history of several MIs 1st one at age 74 along with history of colon cancer. Also remarkable for breast cancer and colon polyps.  ADVANCED DIRECTIVES:  <no information>  SOCIAL HISTORY: Reviewed History  Substance Use Topics  . Smoking status: Never Smoker   . Smokeless tobacco: Never Used  . Alcohol Use: No  Denies Smoking, alcohol or recreational drug usage.  Physically active and ambulatory.    Allergies    Allergen Reactions  . Levaquin [Levofloxacin] Other (See Comments)    Severe tightness in left leg  . Penicillins Rash    Current Outpatient Prescriptions  Medication Sig Dispense Refill  . aspirin 81 MG tablet Take 81 mg by mouth daily.    . carvedilol (COREG) 25 MG tablet Take 25 mg by mouth 2 (two) times daily with a meal.    . loratadine (CLARITIN) 10 MG tablet Take 10 mg by mouth daily.    . pantoprazole (PROTONIX) 20 MG tablet Take 20 mg by mouth daily.    . tamoxifen (NOLVADEX) 20 MG tablet take 1 tablet by mouth once daily 90 tablet 4  . valsartan (DIOVAN) 160 MG tablet Take 160 mg by mouth 2 (two) times daily.    . hydrochlorothiazide (HYDRODIURIL) 25 MG tablet Take 25 mg by mouth daily.    . metoprolol (LOPRESSOR) 50 MG tablet Take 100 mg by mouth daily.     . valsartan-hydrochlorothiazide (DIOVAN-HCT) 160-12.5 MG per tablet Take 1 tablet by mouth daily at 2 PM daily at 2 PM.      No current facility-administered medications for this visit.    OBJECTIVE: Filed Vitals:   08/05/14 1516  BP: 138/83  Pulse: 82  Temp: 97.2 F (36.2 C)     Body mass index is 31.43 kg/(m^2).    ECOG FS:0 - Asymptomatic  GENERAL: Patient is alert and oriented and in no acute distress. There is no icterus. HEENT: EOMs intact. No cervical lymphadenopathy. CVS: S1S2, regular LUNGS: Bilaterally clear to auscultation, no rhonchi. ABDOMEN: Soft, nontender. No hepatomegaly clinically.  EXTREMITIES: No pedal edema. BREASTS : surgical scar in the left breast with some induration and scar tissue, no dominant masses. Right breast is negative for masses  No axillary adenopathy on either side. Exam performed in presence of a nurse  LAB RESULTS:    Component Value Date/Time   NA 139 02/14/2012 0910   K 3.6 02/14/2012 0910   CL 104 02/14/2012 0910   CO2 29 02/14/2012 0910   GLUCOSE 84 02/14/2012 0910   BUN 17 02/14/2012 0910   CREATININE 0.54 08/05/2014 1425   CREATININE 0.85 06/24/2013 1132    CALCIUM 8.6 02/14/2012 0910   PROT 6.8 08/05/2014 1425   PROT 6.8 06/24/2013 1132   ALBUMIN 3.4* 08/05/2014 1425   ALBUMIN 3.0* 06/24/2013 1132   AST 18 08/05/2014 1425   AST 14* 06/24/2013 1132   ALT 15 08/05/2014 1425   ALT 17 06/24/2013 1132   ALKPHOS 45 08/05/2014 1425   ALKPHOS 57 06/24/2013 1132   BILITOT 0.5 08/05/2014 1425   GFRNONAA >60 08/05/2014 1425   GFRNONAA >60 06/24/2013 1132   GFRAA >60 08/05/2014 1425   GFRAA >60 06/24/2013 1132   Lab Results  Component Value Date   WBC 5.7 08/05/2014   NEUTROABS 3.1 08/05/2014   HGB 12.1 08/05/2014   HCT 36.7 08/05/2014   MCV 84.1 08/05/2014   PLT 178 08/05/2014     STUDIES: 02/17/14 - Bilateral Mammogram reported as BIRADS-2, benign. Annual followup  recommended.   STAGING: Malignant neoplasm of upper-outer quadrant of female breast   Staging form: Breast, AJCC 6th Edition     Clinical stage from 08/23/2014: Stage 0 (Tis (DCIS), N0, M0) - Signed by Leia Alf, MD on 08/23/2014   ASSESSMENT / PLAN:   Ductal Carcinoma in Situ (DCIS) (Tis N0 M0) of left breast , grade 2, comedo status post wide local excision in December 2011 with positive margins, then had reexcision on 03/10/10 with no residual DCIS found.  ER/PR positive -  Reviewed labs and d/w patient. She continues to do well without any new side effects from tamoxifen, also no history of thromboembolic phenomena. No new breast abnormilities on exam, last mammogram in Dec 2015 reported as BIRADS-2, benign, and annual followup recommended. Patient states mammogram continues to be scheduled by Dr.Byrnett's office and sees him after it is done. Plan is to continue Tamoxifen 20 mg daily. Given risk of thromboembolic phenomena from tamoxifen therapy she was advised to continue to take prophylactic dose of Lovenox 40 mg subcutaneous injection prior to the  long plane trips, states she does not have any planned in the near future and does not want to take lovenox if she goes on  shorted plane rides. States she will call us for prescription for lovenox as and when necessary. Patient has f/u appt with Dr.Byrnett after next mammogram, will therefore see her back in one year with labs. In between visits, the patient has been advised to call in case of new breast masses on self-exam, new symptoms or new side effects from tamoxifen.  She is agreeable to this plan.   Leia Alf, MD   08/23/2014 7:28 AM

## 2015-01-30 ENCOUNTER — Other Ambulatory Visit: Payer: Self-pay | Admitting: General Surgery

## 2015-02-22 ENCOUNTER — Encounter: Payer: Self-pay | Admitting: General Surgery

## 2015-02-25 ENCOUNTER — Ambulatory Visit: Payer: BLUE CROSS/BLUE SHIELD | Admitting: General Surgery

## 2015-04-28 ENCOUNTER — Encounter: Payer: Self-pay | Admitting: *Deleted

## 2015-05-10 ENCOUNTER — Ambulatory Visit (INDEPENDENT_AMBULATORY_CARE_PROVIDER_SITE_OTHER): Payer: BLUE CROSS/BLUE SHIELD | Admitting: General Surgery

## 2015-05-10 ENCOUNTER — Encounter: Payer: Self-pay | Admitting: General Surgery

## 2015-05-10 VITALS — BP 124/74 | HR 74 | Resp 14 | Ht 59.0 in | Wt 157.0 lb

## 2015-05-10 DIAGNOSIS — Z853 Personal history of malignant neoplasm of breast: Secondary | ICD-10-CM

## 2015-05-10 NOTE — Progress Notes (Signed)
Patient ID: Stacie Ellis, female   DOB: 1956-01-31, 60 y.o.   MRN: [id number]  Chief Complaint  Patient presents with  . Follow-up    mammogram    HPI Stacie Ellis is a 60 y.o. female who presents for a breast evaluation. The most recent mammogram was done on 02/19/15.  Patient does perform regular self breast checks and gets regular mammograms done.    HPI  Past Medical History  Diagnosis Date  . Hypertension   . Postmenopausal bleeding 2013  . GERD (gastroesophageal reflux disease) 2008  . Obesity, unspecified   . Special screening for malignant neoplasms, colon   . Malignant neoplasm of upper-outer quadrant of female breast ([place]) 2011    Intermediate grade DCIS, ER: 50%; PR 10%. left breast lumpectomy, wide excision and a repeat wide excision on 03/10/2010 for multiple positive margins on original resection  . S/P radiation therapy 2012    whole breast,   . Malignant neoplasm of upper-outer quadrant of female breast [facility]) January 2012:     Re-excision to negative margins.   . Asthma     Past Surgical History  Procedure Laterality Date  . Cesarean section    . Breast surgery Left 02/17/2010;03/10/2010    lumpectomy and repeat wide excision  . Appendectomy  2008  . Colonoscopy  2010    Dr. Vira Agar  . Colon surgery Right 2008    hemicolectomy/polyp villous adenoma without atypia    Family History  Problem Relation Age of Onset  . Cancer Father     colon  . Cancer Other     unknown family member with breast cancer  . Colon polyps Other     unknown family member with colon polyps    Social History Social History  Substance Use Topics  . Smoking status: Never Smoker   . Smokeless tobacco: Never Used  . Alcohol Use: No    Allergies  Allergen Reactions  . Levaquin [Levofloxacin] Other (See Comments)    Severe tightness in left leg  . Penicillins Rash    Current Outpatient Prescriptions  Medication Sig Dispense Refill  . aspirin 81 MG tablet Take  81 mg by mouth daily.    . carvedilol (COREG) 25 MG tablet Take 25 mg by mouth 2 (two) times daily with a meal.    . hydrochlorothiazide (HYDRODIURIL) 25 MG tablet Take 25 mg by mouth daily.    Marland Kitchen loratadine (CLARITIN) 10 MG tablet Take 10 mg by mouth daily.    . pantoprazole (PROTONIX) 20 MG tablet Take 20 mg by mouth daily.    . tamoxifen (NOLVADEX) 20 MG tablet take 1 tablet by mouth once daily 90 tablet 4  . valsartan (DIOVAN) 160 MG tablet Take 160 mg by mouth 2 (two) times daily.    . valsartan-hydrochlorothiazide (DIOVAN-HCT) 160-12.5 MG per tablet Take 1 tablet by mouth daily at 2 PM daily at 2 PM.     . ranitidine (ZANTAC) 300 MG capsule Take by mouth. Reported on 05/10/2015     No current facility-administered medications for this visit.    Review of Systems Review of Systems  Constitutional: Negative.   Respiratory: Negative.   Cardiovascular: Negative.     Blood pressure 124/74, pulse 74, resp. rate 14, height 4\' 11"  (1.499 m), weight 157 lb (71.215 kg).  Physical Exam Physical Exam  Constitutional: She is oriented to person, place, and time. She appears well-developed and well-nourished.  Eyes: Conjunctivae are normal. No scleral icterus.  Neck: Neck  supple.  Cardiovascular: Normal rate, regular rhythm and normal heart sounds.   Pulmonary/Chest: Effort normal and breath sounds normal. Right breast exhibits no inverted nipple, no mass, no nipple discharge, no skin change and no tenderness. Left breast exhibits no inverted nipple, no mass, no nipple discharge, no skin change and no tenderness. Breasts are asymmetrical (right breast 1and a half cups sizes biger than left . ).  Left breast well healed incision at 3 o'clock.   Lymphadenopathy:    She has no cervical adenopathy.    She has no axillary adenopathy.  Neurological: She is alert and oriented to person, place, and time.  Skin: Skin is warm and dry.    Data Reviewed Bilateral diagnostic mammograms dated 02/19/2015  completed at [facility] were reviewed. Dystrophic calcifications in the left breast previous surgery/radiation noted. BI-RADS-2.  Assessment    Doing well now 5 years status post treatment, 4.5 years on tamoxifen.    Plan    The patient raised the question regarding extended treatment with tamoxifen for her DCIS. Her tumor was intermittently sensitive and it's unlikely that she be candidate for extended therapy. We'll determine if she would be a candidate for additional testing of her original tumor.  She'll continue on tamoxifen, ending 5 years of therapy this summer. She'll be contacted when literature review is complete.    The patient has been asked to return to the office in one year with a bilateral diagnostic mammogram. PCP:  Lavera Guise This information has been scribed by Gaspar Cola CMA.   Robert Bellow 05/11/2015, 9:25 PM

## 2015-05-10 NOTE — Patient Instructions (Signed)
The patient has been asked to return to the office in one year with a bilateral diagnostic mammogram. 

## 2015-05-13 ENCOUNTER — Telehealth: Payer: Self-pay | Admitting: General Surgery

## 2015-05-13 NOTE — Telephone Encounter (Signed)
I spoke with that of medical oncology at [facility]. No indication for extended treatment for DCIS with antiestrogen therapy.

## 2015-06-21 ENCOUNTER — Ambulatory Visit: Payer: BLUE CROSS/BLUE SHIELD | Admitting: Anesthesiology

## 2015-06-21 ENCOUNTER — Encounter
Admission: RE | Disposition: A | Discharge status: Home / Self Care | Payer: Self-pay | Source: Ambulatory Visit | Attending: Gastroenterology

## 2015-06-21 ENCOUNTER — Encounter: Payer: Self-pay | Admitting: *Deleted

## 2015-06-21 ENCOUNTER — Ambulatory Visit
Admission: RE | Admit: 2015-06-21 | Discharge: 2015-06-21 | Disposition: A | Discharge status: Home / Self Care | Payer: BLUE CROSS/BLUE SHIELD | Source: Ambulatory Visit | Attending: Gastroenterology | Admitting: Gastroenterology

## 2015-06-21 DIAGNOSIS — I10 Essential (primary) hypertension: Secondary | ICD-10-CM | POA: Insufficient documentation

## 2015-06-21 DIAGNOSIS — Z7951 Long term (current) use of inhaled steroids: Secondary | ICD-10-CM | POA: Diagnosis not present

## 2015-06-21 DIAGNOSIS — R1310 Dysphagia, unspecified: Secondary | ICD-10-CM | POA: Insufficient documentation

## 2015-06-21 DIAGNOSIS — Z881 Allergy status to other antibiotic agents status: Secondary | ICD-10-CM | POA: Insufficient documentation

## 2015-06-21 DIAGNOSIS — K449 Diaphragmatic hernia without obstruction or gangrene: Secondary | ICD-10-CM | POA: Insufficient documentation

## 2015-06-21 DIAGNOSIS — Z800 Family history of malignant neoplasm of digestive organs: Secondary | ICD-10-CM | POA: Insufficient documentation

## 2015-06-21 DIAGNOSIS — Z7982 Long term (current) use of aspirin: Secondary | ICD-10-CM | POA: Insufficient documentation

## 2015-06-21 DIAGNOSIS — Z6831 Body mass index (BMI) 31.0-31.9, adult: Secondary | ICD-10-CM | POA: Insufficient documentation

## 2015-06-21 DIAGNOSIS — K222 Esophageal obstruction: Secondary | ICD-10-CM | POA: Insufficient documentation

## 2015-06-21 DIAGNOSIS — Z803 Family history of malignant neoplasm of breast: Secondary | ICD-10-CM | POA: Insufficient documentation

## 2015-06-21 DIAGNOSIS — Z9049 Acquired absence of other specified parts of digestive tract: Secondary | ICD-10-CM | POA: Diagnosis not present

## 2015-06-21 DIAGNOSIS — R1319 Other dysphagia: Secondary | ICD-10-CM | POA: Diagnosis present

## 2015-06-21 DIAGNOSIS — Z79899 Other long term (current) drug therapy: Secondary | ICD-10-CM | POA: Insufficient documentation

## 2015-06-21 DIAGNOSIS — Z880 Allergy status to penicillin: Secondary | ICD-10-CM | POA: Diagnosis not present

## 2015-06-21 DIAGNOSIS — J45909 Unspecified asthma, uncomplicated: Secondary | ICD-10-CM | POA: Diagnosis not present

## 2015-06-21 DIAGNOSIS — K219 Gastro-esophageal reflux disease without esophagitis: Secondary | ICD-10-CM | POA: Insufficient documentation

## 2015-06-21 DIAGNOSIS — E669 Obesity, unspecified: Secondary | ICD-10-CM | POA: Diagnosis not present

## 2015-06-21 DIAGNOSIS — Z853 Personal history of malignant neoplasm of breast: Secondary | ICD-10-CM | POA: Diagnosis not present

## 2015-06-21 DIAGNOSIS — Z923 Personal history of irradiation: Secondary | ICD-10-CM | POA: Insufficient documentation

## 2015-06-21 DIAGNOSIS — Z9889 Other specified postprocedural states: Secondary | ICD-10-CM | POA: Insufficient documentation

## 2015-06-21 HISTORY — PX: ESOPHAGOGASTRODUODENOSCOPY (EGD) WITH PROPOFOL: SHX5813

## 2015-06-21 SURGERY — ESOPHAGOGASTRODUODENOSCOPY (EGD) WITH PROPOFOL
Anesthesia: General

## 2015-06-21 MED ORDER — SODIUM CHLORIDE 0.9 % IV SOLN: INTRAVENOUS | Status: DC

## 2015-06-21 MED ORDER — SODIUM CHLORIDE 0.9 % IV SOLN
INTRAVENOUS | Status: DC
  Administered 2015-06-21: 1000 mL via INTRAVENOUS

## 2015-06-21 MED ORDER — PROPOFOL 10 MG/ML IV BOLUS
INTRAVENOUS | Status: DC | PRN
  Administered 2015-06-21: 80 mg via INTRAVENOUS
  Administered 2015-06-21: 20 mg via INTRAVENOUS
  Administered 2015-06-21: 40 mg via INTRAVENOUS

## 2015-06-21 MED ORDER — FENTANYL CITRATE (PF) 100 MCG/2ML IJ SOLN
INTRAMUSCULAR | Status: DC | PRN
  Administered 2015-06-21: 50 ug via INTRAVENOUS

## 2015-06-21 NOTE — Op Note (Signed)
[facility] Patient Name: Stacie Ellis Procedure Date: 06/21/2015 2:14 PM MRN: [id number] Account #: [id number] Date of Birth: [birth year] Admit Type: Outpatient Age: 60 Room: [facility] ENDO ROOM 4 Gender: Female Note Status: Finalized Procedure:            Upper GI endoscopy Indications:          Dysphagia, Suspected esophageal reflux Providers:            Lupita Dawn. Candace Cruise, MD Referring MD:         Lavera Guise, MD (Referring MD) Medicines:            Monitored Anesthesia Care Complications:        No immediate complications. Procedure:            Pre-Anesthesia Assessment:                       - Prior to the procedure, a History and Physical was                        performed, and patient medications, allergies and                        sensitivities were reviewed. The patient's tolerance of                        previous anesthesia was reviewed.                       - The risks and benefits of the procedure and the                        sedation options and risks were discussed with the                        patient. All questions were answered and informed                        consent was obtained.                       - After reviewing the risks and benefits, the patient                        was deemed in satisfactory condition to undergo the                        procedure.                       After obtaining informed consent, the endoscope was                        passed under direct vision. Throughout the procedure,                        the patient's blood pressure, pulse, and oxygen                        saturations were monitored continuously. The Endoscope  was introduced through the mouth, and advanced to the                        second part of duodenum. The upper GI endoscopy was                        accomplished without difficulty. The patient tolerated                        the procedure  well. Findings:      One mild benign-appearing, intrinsic stenosis was found at the       gastroesophageal junction. And was traversed. The scope was withdrawn.       Dilation was performed with a Maloney dilator with mild resistance at 66       Fr.      The exam was otherwise without abnormality.      A small hiatal hernia was present.      The exam was otherwise without abnormality.      The duodenum was normal. Impression:           - Benign-appearing esophageal stenosis. Dilated.                       - The examination was otherwise normal.                       - Small hiatal hernia.                       - The examination was otherwise normal.                       - No specimens collected. Recommendation:       - Discharge patient to home.                       - Observe patient's clinical course.                       - The findings and recommendations were discussed with                        the patient. Procedure Code(s):    --- Professional ---                       [phone], Esophagogastroduodenoscopy, flexible, transoral;                        diagnostic, including collection of specimen(s) by                        brushing or washing, when performed (separate procedure)                       43450, Dilation of esophagus, by unguided sound or                        bougie, single or multiple passes Diagnosis Code(s):    --- Professional ---                       K22.2, Esophageal obstruction  K44.9, Diaphragmatic hernia without obstruction or                        gangrene                       R13.10, Dysphagia, unspecified CPT copyright 2016 American Medical Association. All rights reserved. The codes documented in this report are preliminary and upon coder review may  be revised to meet current compliance requirements. Hulen Luster, MD 06/21/2015 2:46:39 PM This report has been signed electronically. Number of Addenda: 0 Note Initiated On:  06/21/2015 2:14 PM      [facility]

## 2015-06-21 NOTE — Transfer of Care (Addendum)
Immediate Anesthesia Transfer of Care Note  Patient: Stacie Ellis  Procedure(s) Performed: Procedure(s): ESOPHAGOGASTRODUODENOSCOPY (EGD) WITH PROPOFOL (N/A)  Patient Location: PACU  Anesthesia Type:General  Level of Consciousness: awake, alert , oriented and patient cooperative  Airway & Oxygen Therapy: Patient Spontanous Breathing  Post-op Assessment: Report given to RN, Post -op Vital signs reviewed and stable and Patient moving all extremities  Post vital signs: Reviewed and stable  Last Vitals:  Filed Vitals:   06/21/15 1411  BP: 137/81  Pulse: 78  Temp: 35.8 C  Resp: 18    Complications: No apparent anesthesia complications

## 2015-06-21 NOTE — Anesthesia Preprocedure Evaluation (Signed)
Anesthesia Evaluation  Patient identified by MRN, date of birth, ID band Patient awake    Reviewed: Allergy & Precautions, NPO status , Patient's Chart, lab work & pertinent test results, reviewed documented beta blocker date and time   Airway Mallampati: II  TM Distance: >3 FB     Dental  (+) Chipped   Pulmonary asthma ,           Cardiovascular hypertension, Pt. on medications and Pt. on home beta blockers      Neuro/Psych    GI/Hepatic GERD  Medicated,  Endo/Other    Renal/GU      Musculoskeletal   Abdominal   Peds  Hematology   Anesthesia Other Findings   Reproductive/Obstetrics                             Anesthesia Physical Anesthesia Plan  ASA: III  Anesthesia Plan: General   Post-op Pain Management:    Induction: Intravenous  Airway Management Planned: Nasal Cannula  Additional Equipment:   Intra-op Plan:   Post-operative Plan:   Informed Consent: I have reviewed the patients History and Physical, chart, labs and discussed the procedure including the risks, benefits and alternatives for the proposed anesthesia with the patient or authorized representative who has indicated his/her understanding and acceptance.     Plan Discussed with: CRNA  Anesthesia Plan Comments:         Anesthesia Quick Evaluation

## 2015-06-21 NOTE — H&P (Signed)
Primary Care Physician:  Lavera Guise, MD Primary Gastroenterologist:  Dr. Candace Cruise  Pre-Procedure History & Physical: HPI:  Stacie Ellis is a 60 y.o. female is here for an EGD   Past Medical History  Diagnosis Date  . Hypertension   . Postmenopausal bleeding 2013  . GERD (gastroesophageal reflux disease) 2008  . Obesity, unspecified   . Special screening for malignant neoplasms, colon   . Malignant neoplasm of upper-outer quadrant of female breast ([place]) 2011    Intermediate grade DCIS, ER: 50%; PR 10%. left breast lumpectomy, wide excision and a repeat wide excision on 03/10/2010 for multiple positive margins on original resection  . S/P radiation therapy 2012    whole breast,   . Malignant neoplasm of upper-outer quadrant of female breast [facility]) January 2012:     Re-excision to negative margins.   . Asthma     Past Surgical History  Procedure Laterality Date  . Cesarean section    . Breast surgery Left 02/17/2010;03/10/2010    lumpectomy and repeat wide excision  . Appendectomy  2008  . Colonoscopy  2010    Dr. Vira Agar  . Colon surgery Right 2008    hemicolectomy/polyp villous adenoma without atypia    Prior to Admission medications   Medication Sig Start Date End Date Taking? Authorizing Provider  albuterol (PROVENTIL HFA;VENTOLIN HFA) 108 (90 Base) MCG/ACT inhaler Inhale 2 puffs into the lungs every 6 (six) hours as needed for wheezing or shortness of breath.   Yes Historical Provider, MD  budesonide-formoterol (SYMBICORT) 80-4.5 MCG/ACT inhaler Inhale 2 puffs into the lungs 2 (two) times daily.   Yes Historical Provider, MD  montelukast (SINGULAIR) 10 MG tablet Take 10 mg by mouth at bedtime.   Yes Historical Provider, MD  aspirin 81 MG tablet Take 81 mg by mouth daily.    Historical Provider, MD  carvedilol (COREG) 25 MG tablet Take 25 mg by mouth 2 (two) times daily with a meal.    Historical Provider, MD  hydrochlorothiazide (HYDRODIURIL) 25 MG tablet Take 25 mg by  mouth daily.    Historical Provider, MD  loratadine (CLARITIN) 10 MG tablet Take 10 mg by mouth daily.    Historical Provider, MD  pantoprazole (PROTONIX) 20 MG tablet Take 20 mg by mouth daily.    Historical Provider, MD  ranitidine (ZANTAC) 300 MG capsule Take by mouth. Reported on 05/10/2015    Historical Provider, MD  tamoxifen (NOLVADEX) 20 MG tablet take 1 tablet by mouth once daily 02/01/15   Robert Bellow, MD  valsartan (DIOVAN) 160 MG tablet Take 160 mg by mouth 2 (two) times daily.    Historical Provider, MD  valsartan-hydrochlorothiazide (DIOVAN-HCT) 160-12.5 MG per tablet Take 1 tablet by mouth daily at 2 PM daily at 2 PM.     Historical Provider, MD    Allergies as of 05/14/2015 - Review Complete 05/10/2015  Allergen Reaction Noted  . Levaquin [levofloxacin] Other (See Comments) 08/20/2012  . Penicillins Rash 08/20/2012    Family History  Problem Relation Age of Onset  . Cancer Father     colon  . Cancer Other     unknown family member with breast cancer  . Colon polyps Other     unknown family member with colon polyps    Social History   Social History  . Marital Status: Married    Spouse Name: N/A  . Number of Children: N/A  . Years of Education: N/A   Occupational History  .  Not on file.   Social History Main Topics  . Smoking status: Never Smoker   . Smokeless tobacco: Never Used  . Alcohol Use: No  . Drug Use: No  . Sexual Activity: Not on file   Other Topics Concern  . Not on file   Social History Narrative    Review of Systems: See HPI, otherwise negative ROS  Physical Exam: There were no vitals taken for this visit. General:   Alert,  pleasant and cooperative in NAD Head:  Normocephalic and atraumatic. Neck:  Supple; no masses or thyromegaly. Lungs:  Clear throughout to auscultation.    Heart:  Regular rate and rhythm. Abdomen:  Soft, nontender and nondistended. Normal bowel sounds, without guarding, and without rebound.   Neurologic:   Alert and  oriented x4;  grossly normal neurologically.  Impression/Plan: Stacie Ellis is here for an EGD to be performed for dysphagia Risks, benefits, limitations, and alternatives regarding EGD with dilation have been reviewed with the patient.  Questions have been answered.  All parties agreeable.   Stacie Ellis, Stacie Dawn, MD  06/21/2015, 1:51 PM

## 2015-06-24 NOTE — Anesthesia Postprocedure Evaluation (Signed)
Anesthesia Post Note  Patient: Kerstan Buelow  Procedure(s) Performed: Procedure(s) (LRB): ESOPHAGOGASTRODUODENOSCOPY (EGD) WITH PROPOFOL (N/A)  Patient location during evaluation: Endoscopy Anesthesia Type: General Level of consciousness: awake and alert Pain management: pain level controlled Vital Signs Assessment: post-procedure vital signs reviewed and stable Respiratory status: spontaneous breathing, nonlabored ventilation, respiratory function stable and patient connected to nasal cannula oxygen Cardiovascular status: blood pressure returned to baseline and stable Postop Assessment: no signs of nausea or vomiting Anesthetic complications: no    Last Vitals:  Filed Vitals:   06/21/15 1510 06/21/15 1520  BP: 152/85 138/77  Pulse: 82   Temp:    Resp: 17 17    Last Pain:  Filed Vitals:   06/22/15 0735  PainSc: 0-No pain                 Stavroula Rohde S

## 2015-08-03 ENCOUNTER — Other Ambulatory Visit: Payer: Self-pay | Admitting: *Deleted

## 2015-08-03 DIAGNOSIS — C50412 Malignant neoplasm of upper-outer quadrant of left female breast: Secondary | ICD-10-CM

## 2015-08-06 ENCOUNTER — Other Ambulatory Visit: Payer: BLUE CROSS/BLUE SHIELD

## 2015-08-06 ENCOUNTER — Ambulatory Visit: Payer: BLUE CROSS/BLUE SHIELD | Admitting: Internal Medicine

## 2015-08-06 ENCOUNTER — Ambulatory Visit: Payer: BLUE CROSS/BLUE SHIELD | Admitting: Family Medicine

## 2015-08-09 ENCOUNTER — Inpatient Hospital Stay: Payer: BLUE CROSS/BLUE SHIELD | Attending: Family Medicine

## 2015-08-09 ENCOUNTER — Inpatient Hospital Stay (HOSPITAL_BASED_OUTPATIENT_CLINIC_OR_DEPARTMENT_OTHER): Payer: BLUE CROSS/BLUE SHIELD | Admitting: Family Medicine

## 2015-08-09 VITALS — BP 134/79 | HR 91 | Temp 97.8°F | Resp 18 | Wt 157.6 lb

## 2015-08-09 DIAGNOSIS — Z170 Estrogen receptor positive status [ER+]: Secondary | ICD-10-CM

## 2015-08-09 DIAGNOSIS — C50412 Malignant neoplasm of upper-outer quadrant of left female breast: Secondary | ICD-10-CM

## 2015-08-09 DIAGNOSIS — I10 Essential (primary) hypertension: Secondary | ICD-10-CM | POA: Insufficient documentation

## 2015-08-09 DIAGNOSIS — H409 Unspecified glaucoma: Secondary | ICD-10-CM | POA: Insufficient documentation

## 2015-08-09 DIAGNOSIS — Z79899 Other long term (current) drug therapy: Secondary | ICD-10-CM | POA: Diagnosis not present

## 2015-08-09 DIAGNOSIS — J45909 Unspecified asthma, uncomplicated: Secondary | ICD-10-CM | POA: Diagnosis not present

## 2015-08-09 DIAGNOSIS — Z9223 Personal history of estrogen therapy: Secondary | ICD-10-CM | POA: Insufficient documentation

## 2015-08-09 DIAGNOSIS — Z853 Personal history of malignant neoplasm of breast: Secondary | ICD-10-CM | POA: Diagnosis present

## 2015-08-09 DIAGNOSIS — D0512 Intraductal carcinoma in situ of left breast: Secondary | ICD-10-CM

## 2015-08-09 DIAGNOSIS — K219 Gastro-esophageal reflux disease without esophagitis: Secondary | ICD-10-CM | POA: Diagnosis not present

## 2015-08-09 DIAGNOSIS — Z7982 Long term (current) use of aspirin: Secondary | ICD-10-CM | POA: Insufficient documentation

## 2015-08-09 DIAGNOSIS — K589 Irritable bowel syndrome without diarrhea: Secondary | ICD-10-CM | POA: Diagnosis not present

## 2015-08-09 DIAGNOSIS — E669 Obesity, unspecified: Secondary | ICD-10-CM | POA: Insufficient documentation

## 2015-08-09 LAB — COMPREHENSIVE METABOLIC PANEL
ALBUMIN: 3.6 g/dL (ref 3.5–5.0)
ALT: 15 U/L (ref 14–54)
ANION GAP: 6 (ref 5–15)
AST: 21 U/L (ref 15–41)
Alkaline Phosphatase: 45 U/L (ref 38–126)
BILIRUBIN TOTAL: 0.6 mg/dL (ref 0.3–1.2)
BUN: 16 mg/dL (ref 6–20)
CO2: 28 mmol/L (ref 22–32)
Calcium: 8.8 mg/dL — ABNORMAL LOW (ref 8.9–10.3)
Chloride: 101 mmol/L (ref 101–111)
Creatinine, Ser: 0.71 mg/dL (ref 0.44–1.00)
Glucose, Bld: 110 mg/dL — ABNORMAL HIGH (ref 65–99)
POTASSIUM: 3.4 mmol/L — AB (ref 3.5–5.1)
SODIUM: 135 mmol/L (ref 135–145)
TOTAL PROTEIN: 6.9 g/dL (ref 6.5–8.1)

## 2015-08-09 LAB — CBC WITH DIFFERENTIAL/PLATELET
BASOS ABS: 0 10*3/uL (ref 0–0.1)
BASOS PCT: 0 %
EOS PCT: 2 %
Eosinophils Absolute: 0.1 10*3/uL (ref 0–0.7)
HCT: 36.4 % (ref 35.0–47.0)
Hemoglobin: 12.4 g/dL (ref 12.0–16.0)
LYMPHS PCT: 39 %
Lymphs Abs: 2.1 10*3/uL (ref 1.0–3.6)
MCH: 28.3 pg (ref 26.0–34.0)
MCHC: 34 g/dL (ref 32.0–36.0)
MCV: 83.3 fL (ref 80.0–100.0)
Monocytes Absolute: 0.5 10*3/uL (ref 0.2–0.9)
Monocytes Relative: 8 %
Neutro Abs: 2.8 10*3/uL (ref 1.4–6.5)
Neutrophils Relative %: 51 %
PLATELETS: 182 10*3/uL (ref 150–440)
RBC: 4.37 MIL/uL (ref 3.80–5.20)
RDW: 14.5 % (ref 11.5–14.5)
WBC: 5.6 10*3/uL (ref 3.6–11.0)

## 2015-08-09 NOTE — Progress Notes (Signed)
[place]  [phone] [phone]) [phone]     ID: Stacie Ellis OB: 01/02/1956  MR#: [id number]  [id number]  Patient Care Team: Lavera Guise, MD as PCP - General (Internal Medicine) Robert Bellow, MD (General Surgery) Eusebio Me, MD (Unknown Physician Specialty)  CHIEF COMPLAINT/DIAGNOSIS:  60 year old female with pathologic stage 0 DCIS (Tis N0 M0) of left breast , grade 2, comedo status post wide local excision in December 2011 with positive margins, then had reexcision on 03/10/10 with no residual DCIS found.  ER/PR positive. Started Tamoxifen in June 2012.   HISTORY OF PRESENT ILLNESS:  Patient returns for oncology followup, she was seen in June 2016 by Dr. Ma Hillock. Overall states that she is doing steady, denies new complaints. States she is tolerating tamoxifen well without side effects. She has previously taken lovenox injections for prophylaxis of DVT when she goes on long plane trips (either to [employer] or internationally) but states recently she has been going on short plane rides only and ambulates frequently in-flight and does not want to take lovenox for these flights. Denies feeling new breast masses on self-exam. Appetite and weight steady. States her last mammogram in Dec 2016 was unremarkable. No new mood disturbances. No new bone pains, 0/10.    REVIEW OF SYSTEMS:   Review of Systems  Constitutional: Positive for malaise/fatigue. Negative for fever, chills, weight loss and diaphoresis.  HENT: Negative.   Eyes: Negative.   Respiratory: Negative for cough, hemoptysis, sputum production, shortness of breath and wheezing.   Cardiovascular: Negative for chest pain, palpitations, orthopnea, claudication, leg swelling and PND.  Gastrointestinal: Negative for heartburn, nausea, vomiting, abdominal pain, diarrhea, constipation, blood in stool and melena.  Genitourinary: Negative.   Musculoskeletal: Negative.   Skin: Negative.     Neurological: Negative for dizziness, tingling, focal weakness, seizures and weakness.  Endo/Heme/Allergies: Does not bruise/bleed easily.  Psychiatric/Behavioral: Negative for depression. The patient is not nervous/anxious and does not have insomnia.    As in HPI above. In addition, no fever, chills or sweats. No new headaches or focal weakness.  No new mood disturbances. No  sore throat, cough, shortness of breath, sputum, hemoptysis or chest pain. No dizziness or palpitation. No abdominal pain, constipation, diarrhea, dysuria or hematuria. No new skin rash or bleeding symptoms. No new paresthesias in extremities. PS ECOG 0.  PAST MEDICAL HISTORY: Reviewed Past Medical History  Diagnosis Date  . Hypertension   . Postmenopausal bleeding 2013  . GERD (gastroesophageal reflux disease) 2008  . Obesity, unspecified   . Special screening for malignant neoplasms, colon   . Malignant neoplasm of upper-outer quadrant of female breast ([place]) 2011    Intermediate grade DCIS, ER: 50%; PR 10%. left breast lumpectomy, wide excision and a repeat wide excision on 03/10/2010 for multiple positive margins on original resection  . S/P radiation therapy 2012    whole breast,   . Malignant neoplasm of upper-outer quadrant of female breast [facility]) January 2012:     Re-excision to negative margins.   . Asthma           Hypertension  GERD  Glaucoma  Irritable bowel syndrome  Right hemicolectomy 2008, denies cancer.  Last colonoscopy 2010.  C-section  DCIS (Tis N0 M0) of left breast , grade 2, comedo status post wide local excision in December 2011 with positive margins, then had reexcision on 03/10/10 with no residual DCIS found.  ER/PR positive  Status post endometrial biopsy on 07/08/2010 which  showed inactive endometrium tissue with no hyperplasia or carcinoma  PAST SURGICAL HISTORY:Reviewed Past Surgical History  Procedure Laterality Date  . Cesarean section    . Breast surgery Left 02/17/2010;03/10/2010     lumpectomy and repeat wide excision  . Appendectomy  2008  . Colonoscopy  2010    Dr. Vira Agar  . Colon surgery Right 2008    hemicolectomy/polyp villous adenoma without atypia  . Esophagogastroduodenoscopy (egd) with propofol N/A 06/21/2015    Procedure: ESOPHAGOGASTRODUODENOSCOPY (EGD) WITH PROPOFOL;  Surgeon: Hulen Luster, MD;  Location: [facility] ENDOSCOPY;  Service: Gastroenterology;  Laterality: N/A;    FAMILY HISTORY:Reviewed Family History  Problem Relation Age of Onset  . Cancer Father     colon  . Cancer Other     unknown family member with breast cancer  . Colon polyps Other     unknown family member with colon polyps  Father with history of several MIs 1st one at age 58 along with history of colon cancer. Also remarkable for breast cancer and colon polyps.  ADVANCED DIRECTIVES:  <no information>  SOCIAL HISTORY: Reviewed Social History  Substance Use Topics  . Smoking status: Never Smoker   . Smokeless tobacco: Never Used  . Alcohol Use: No  Denies Smoking, alcohol or recreational drug usage.  Physically active and ambulatory.    Allergies  Allergen Reactions  . Levaquin [Levofloxacin] Other (See Comments)    Severe tightness in left leg  . Penicillins Rash    Current Outpatient Prescriptions  Medication Sig Dispense Refill  . albuterol (PROVENTIL HFA;VENTOLIN HFA) 108 (90 Base) MCG/ACT inhaler Inhale 2 puffs into the lungs every 6 (six) hours as needed for wheezing or shortness of breath.    Marland Kitchen aspirin 81 MG tablet Take 81 mg by mouth daily.    . budesonide-formoterol (SYMBICORT) 80-4.5 MCG/ACT inhaler Inhale 2 puffs into the lungs 2 (two) times daily.    . carvedilol (COREG) 25 MG tablet Take 25 mg by mouth 2 (two) times daily with a meal.    . hydrochlorothiazide (HYDRODIURIL) 25 MG tablet Take 25 mg by mouth daily.    Marland Kitchen loratadine (CLARITIN) 10 MG tablet Take 10 mg by mouth daily.    . montelukast (SINGULAIR) 10 MG tablet Take 10 mg by mouth at bedtime.    .  pantoprazole (PROTONIX) 40 MG tablet Take 1 tablet by mouth daily.  0  . tamoxifen (NOLVADEX) 20 MG tablet take 1 tablet by mouth once daily 90 tablet 4  . valsartan (DIOVAN) 160 MG tablet Take 160 mg by mouth 2 (two) times daily.     No current facility-administered medications for this visit.    OBJECTIVE: Filed Vitals:   08/09/15 1436  BP: 134/79  Pulse: 91  Temp: 97.8 F (36.6 C)  Resp: 18     Body mass index is 31.82 kg/(m^2).    ECOG FS:0 - Asymptomatic  GENERAL: Patient is alert and oriented and in no acute distress. There is no icterus. HEENT: EOMs intact. No cervical lymphadenopathy. CVS: S1S2, regular LUNGS: Bilaterally clear to auscultation, no rhonchi. ABDOMEN: Soft, nontender. No hepatomegaly clinically.  EXTREMITIES: No pedal edema. BREASTS : surgical scar in the left breast with some induration and scar tissue, no dominant masses. Right breast is negative for masses  No axillary adenopathy on either side.   LAB RESULTS:    Component Value Date/Time   NA 135 08/09/2015 1415   NA 139 02/14/2012 0910   K 3.4* 08/09/2015 1415   K  3.6 02/14/2012 0910   CL 101 08/09/2015 1415   CL 104 02/14/2012 0910   CO2 28 08/09/2015 1415   CO2 29 02/14/2012 0910   GLUCOSE 110* 08/09/2015 1415   GLUCOSE 84 02/14/2012 0910   BUN 16 08/09/2015 1415   BUN 17 02/14/2012 0910   CREATININE 0.71 08/09/2015 1415   CREATININE 0.85 06/24/2013 1132   CALCIUM 8.8* 08/09/2015 1415   CALCIUM 8.6 02/14/2012 0910   PROT 6.9 08/09/2015 1415   PROT 6.8 06/24/2013 1132   ALBUMIN 3.6 08/09/2015 1415   ALBUMIN 3.0* 06/24/2013 1132   AST 21 08/09/2015 1415   AST 14* 06/24/2013 1132   ALT 15 08/09/2015 1415   ALT 17 06/24/2013 1132   ALKPHOS 45 08/09/2015 1415   ALKPHOS 57 06/24/2013 1132   BILITOT 0.6 08/09/2015 1415   BILITOT 0.3 06/24/2013 1132   GFRNONAA >60 08/09/2015 1415   GFRNONAA >60 06/24/2013 1132   GFRAA >60 08/09/2015 1415   GFRAA >60 06/24/2013 1132   Lab Results    Component Value Date   WBC 5.6 08/09/2015   NEUTROABS 2.8 08/09/2015   HGB 12.4 08/09/2015   HCT 36.4 08/09/2015   MCV 83.3 08/09/2015   PLT 182 08/09/2015     STUDIES: 02/17/14 - Bilateral Mammogram reported as BIRADS-2, benign. Annual followup recommended.   STAGING: Malignant neoplasm of upper-outer quadrant of female breast   Staging form: Breast, AJCC 6th Edition     Clinical stage from 08/23/2014: Stage 0 (Tis (DCIS), N0, M0) - Signed by Leia Alf, MD on 08/23/2014   ASSESSMENT / PLAN:   Ductal Carcinoma in Situ (DCIS) (Tis N0 M0) of left breast , grade 2, comedo status post wide local excision in December 2011 with positive margins, then had reexcision on 03/10/10 with no residual DCIS found.  ER/PR positive -  Reviewed labs and d/w patient. She continues to do well without any new side effects from tamoxifen, also no history of thromboembolic phenomena. No new breast abnormilities on exam, last mammogram in Dec 2016 reported as BIRADS-2, benign, and annual followup recommended.  Patient has completed 5 years of Tamoxifen therapy. She no longer requires follow up with our office. She continues to follow up with her surgeon and her GYN.   She is agreeable to this plan. Dr. Grayland Ormond was available for consultation and review of plan of care for this patient.   Evlyn Kanner, NP   08/09/2015 3:09 PM

## 2015-08-09 NOTE — Progress Notes (Signed)
States is feeling well. Has occasional breast tenderness in left breast that has been ongoing since diagnosis. Last mammo dec 2016.

## 2016-03-30 ENCOUNTER — Other Ambulatory Visit: Payer: Self-pay

## 2016-03-30 DIAGNOSIS — C50412 Malignant neoplasm of upper-outer quadrant of left female breast: Secondary | ICD-10-CM

## 2016-04-26 ENCOUNTER — Inpatient Hospital Stay
Admission: RE | Admit: 2016-04-26 | Discharge: 2016-04-26 | Disposition: A | Discharge status: Home / Self Care | Payer: Self-pay | Source: Ambulatory Visit | Attending: *Deleted | Admitting: *Deleted

## 2016-04-26 ENCOUNTER — Other Ambulatory Visit: Payer: Self-pay | Admitting: *Deleted

## 2016-04-26 DIAGNOSIS — Z9289 Personal history of other medical treatment: Secondary | ICD-10-CM

## 2016-05-11 ENCOUNTER — Ambulatory Visit: Payer: BLUE CROSS/BLUE SHIELD

## 2016-05-11 ENCOUNTER — Other Ambulatory Visit: Payer: BLUE CROSS/BLUE SHIELD

## 2016-05-17 ENCOUNTER — Ambulatory Visit
Admission: RE | Admit: 2016-05-17 | Discharge: 2016-05-17 | Disposition: A | Discharge status: Home / Self Care | Payer: BLUE CROSS/BLUE SHIELD | Source: Ambulatory Visit | Attending: General Surgery | Admitting: General Surgery

## 2016-05-17 DIAGNOSIS — C50412 Malignant neoplasm of upper-outer quadrant of left female breast: Secondary | ICD-10-CM | POA: Diagnosis not present

## 2016-05-23 ENCOUNTER — Other Ambulatory Visit: Payer: BLUE CROSS/BLUE SHIELD

## 2016-05-29 ENCOUNTER — Ambulatory Visit (INDEPENDENT_AMBULATORY_CARE_PROVIDER_SITE_OTHER): Payer: BLUE CROSS/BLUE SHIELD | Admitting: General Surgery

## 2016-05-29 ENCOUNTER — Encounter: Payer: Self-pay | Admitting: General Surgery

## 2016-05-29 VITALS — BP 142/78 | HR 72 | Resp 12 | Ht 59.0 in | Wt 157.0 lb

## 2016-05-29 DIAGNOSIS — Z853 Personal history of malignant neoplasm of breast: Secondary | ICD-10-CM | POA: Diagnosis not present

## 2016-05-29 DIAGNOSIS — K439 Ventral hernia without obstruction or gangrene: Secondary | ICD-10-CM

## 2016-05-29 NOTE — Patient Instructions (Addendum)
The patient has been asked to return to the office in one year with a bilateral diagnostic mammogram. Call the office when convenient to schedule for hernia repair.    Hernia, Adult A hernia is the bulging of an organ or tissue through a weak spot in the muscles of the abdomen (abdominal wall). Hernias develop most often near the navel or groin. There are many kinds of hernias. Common kinds include:  Femoral hernia. This kind of hernia develops under the groin in the upper thigh area.  Inguinal hernia. This kind of hernia develops in the groin or scrotum.  Umbilical hernia. This kind of hernia develops near the navel.  Hiatal hernia. This kind of hernia causes part of the stomach to be pushed up into the chest.  Incisional hernia. This kind of hernia bulges through a scar from an abdominal surgery. What are the causes? This condition may be caused by:  Heavy lifting.  Coughing over a long period of time.  Straining to have a bowel movement.  An incision made during an abdominal surgery.  A birth defect (congenital defect).  Excess weight or obesity.  Smoking.  Poor nutrition.  Cystic fibrosis.  Excess fluid in the abdomen.  Undescended testicles. What are the signs or symptoms? Symptoms of a hernia include:  A lump on the abdomen. This is the first sign of a hernia. The lump may become more obvious with standing, straining, or coughing. It may get bigger over time if it is not treated or if the condition causing it is not treated.  Pain. A hernia is usually painless, but it may become painful over time if treatment is delayed. The pain is usually dull and may get worse with standing or lifting heavy objects. Sometimes a hernia gets tightly squeezed in the weak spot (strangulated) or stuck there (incarcerated) and causes additional symptoms. These symptoms may include:  Vomiting.  Nausea.  Constipation.  Irritability. How is this diagnosed? A hernia may be  diagnosed with:  A physical exam. During the exam your health care provider may ask you to cough or to make a specific movement, because a hernia is usually more visible when you move.  Imaging tests. These can include:  X-rays.  Ultrasound.  CT scan. How is this treated? A hernia that is small and painless may not need to be treated. A hernia that is large or painful may be treated with surgery. Inguinal hernias may be treated with surgery to prevent incarceration or strangulation. Strangulated hernias are always treated with surgery, because lack of blood to the trapped organ or tissue can cause it to die. Surgery to treat a hernia involves pushing the bulge back into place and repairing the weak part of the abdomen. Follow these instructions at home:  Avoid straining.  Do not lift anything heavier than 10 lb (4.5 kg).  Lift with your leg muscles, not your back muscles. This helps avoid strain.  When coughing, try to cough gently.  Prevent constipation. Constipation leads to straining with bowel movements, which can make a hernia worse or cause a hernia repair to break down. You can prevent constipation by:  Eating a high-fiber diet that includes plenty of fruits and vegetables.  Drinking enough fluids to keep your urine clear or pale yellow. Aim to drink 6-8 glasses of water per day.  Using a stool softener as directed by your health care provider.  Lose weight, if you are overweight.  Do not use any tobacco products, including cigarettes,  chewing tobacco, or electronic cigarettes. If you need help quitting, ask your health care provider.  Keep all follow-up visits as directed by your health care provider. This is important. Your health care provider may need to monitor your condition. Contact a health care provider if:  You have swelling, redness, and pain in the affected area.  Your bowel habits change. Get help right away if:  You have a fever.  You have abdominal  pain that is getting worse.  You feel nauseous or you vomit.  You cannot push the hernia back in place by gently pressing on it while you are lying down.  The hernia:  Changes in shape or size.  Is stuck outside the abdomen.  Becomes discolored.  Feels hard or tender. This information is not intended to replace advice given to you by your health care provider. Make sure you discuss any questions you have with your health care provider. Document Released: 02/20/2005 Document Revised: 07/21/2015 Document Reviewed: 12/31/2013 Elsevier Interactive Patient Education  2017 [employer].

## 2016-05-29 NOTE — Progress Notes (Signed)
Patient ID: Stacie Ellis, female   DOB: 11-May-1955, 61 y.o.   MRN: [id number]  Chief Complaint  Patient presents with  . Follow-up    HPI Stacie Ellis is a 61 y.o. female who presents for a breast evaluation. The most recent mammogram was done on 05/17/2016. Patient does perform regular self breast checks and gets regular mammograms done. Patient states her left breast is still tender to touch .  Patient stop her Tamoxifen in June, 2017. Patient states she has been having some tenderness in umbilical area.   HPI  Past Medical History:  Diagnosis Date  . Asthma   . GERD (gastroesophageal reflux disease) 2008  . Hypertension   . Malignant neoplasm of upper-outer quadrant of female breast ([place]) 2011   Intermediate grade DCIS, ER: 50%; PR 10%. left breast lumpectomy, wide excision and a repeat wide excision on 03/10/2010 for multiple positive margins on original resection  . Malignant neoplasm of upper-outer quadrant of female breast [facility]) January 2012:    Re-excision to negative margins.   . Obesity, unspecified   . Postmenopausal bleeding 2013  . S/P radiation therapy 2012   whole breast,   . Special screening for malignant neoplasms, colon     Past Surgical History:  Procedure Laterality Date  . APPENDECTOMY  2008  . BREAST BIOPSY Left 2011   pt states had stereo done on mobile unit at Dr. Dwyane Luo office. DCIS  . BREAST LUMPECTOMY Left 02/2010   lumpectomy with re excision 03/2010 of left breast for cancer and rad tx  . BREAST SURGERY Left 02/17/2010;03/10/2010   lumpectomy and repeat wide excision  . CESAREAN SECTION    . COLON SURGERY Right 06/13/2006   Hand-assisted right hemicolectomy/polyp villous adenoma without atypia  . COLONOSCOPY  2010   Dr. Vira Agar  . ESOPHAGOGASTRODUODENOSCOPY (EGD) WITH PROPOFOL N/A 06/21/2015   Procedure: ESOPHAGOGASTRODUODENOSCOPY (EGD) WITH PROPOFOL;  Surgeon: Hulen Luster, MD;  Location: [facility] ENDOSCOPY;  Service: Gastroenterology;   Laterality: N/A;    Family History  Problem Relation Age of Onset  . Cancer Father     colon  . Cancer Other     unknown family member with breast cancer  . Colon polyps Other     unknown family member with colon polyps    Social History Social History  Substance Use Topics  . Smoking status: Never Smoker  . Smokeless tobacco: Never Used  . Alcohol use No    Allergies  Allergen Reactions  . Levaquin [Levofloxacin] Other (See Comments)    Severe tightness in left leg  . Penicillins Rash    Current Outpatient Prescriptions  Medication Sig Dispense Refill  . albuterol (PROVENTIL HFA;VENTOLIN HFA) 108 (90 Base) MCG/ACT inhaler Inhale 2 puffs into the lungs every 6 (six) hours as needed for wheezing or shortness of breath.    Marland Kitchen amLODipine (NORVASC) 2.5 MG tablet Take 2.5 mg by mouth daily.    Marland Kitchen aspirin 81 MG tablet Take 81 mg by mouth daily.    . budesonide-formoterol (SYMBICORT) 80-4.5 MCG/ACT inhaler Inhale 2 puffs into the lungs 2 (two) times daily.    . carvedilol (COREG) 25 MG tablet Take 25 mg by mouth 2 (two) times daily with a meal.    . hydrochlorothiazide (HYDRODIURIL) 25 MG tablet Take 25 mg by mouth daily.    Marland Kitchen loratadine (CLARITIN) 10 MG tablet Take 10 mg by mouth daily.    . montelukast (SINGULAIR) 10 MG tablet Take 10 mg by mouth at bedtime.    Marland Kitchen  pantoprazole (PROTONIX) 40 MG tablet Take 1 tablet by mouth daily.  0  . valsartan (DIOVAN) 160 MG tablet Take 160 mg by mouth 2 (two) times daily.     No current facility-administered medications for this visit.     Review of Systems Review of Systems  Constitutional: Negative.   Respiratory: Negative.   Cardiovascular: Negative.     Blood pressure (!) 142/78, pulse 72, resp. rate 12, height 4\' 11"  (1.499 m), weight 157 lb (71.2 kg).  Physical Exam Physical Exam  Constitutional: She is oriented to person, place, and time. She appears well-developed and well-nourished.  Eyes: Conjunctivae are normal. No  scleral icterus.  Neck: Neck supple.  Cardiovascular: Normal rate, regular rhythm and normal heart sounds.   Pulmonary/Chest: Effort normal and breath sounds normal. Right breast exhibits no inverted nipple, no mass, no nipple discharge, no skin change and no tenderness. Left breast exhibits no inverted nipple, no mass, no nipple discharge, no skin change and no tenderness.    Abdominal: Soft. Normal appearance and bowel sounds are normal. There is no hepatomegaly. There is no tenderness. A hernia (small umbilical hernia) is present.    Lymphadenopathy:    She has no cervical adenopathy.    She has no axillary adenopathy.  Neurological: She is alert and oriented to person, place, and time.  Skin: Skin is warm and dry.    Data Reviewed Bilateral diagnostic mammograms dated 05/17/2016 were reviewed. Extensive fat necrosis again noted in the surgical site. BI-RADS-2. 09/09/2013 colonoscopy was normal. Status post right hemicolectomy. Follow-up exam in 2020 planned.  Assessment    No evidence of recurrent DCIS.  Ventral hernia status post hand-assisted right hemicolectomy in 2008, presently asymptomatic.    Plan    The hernia first became evident since last year's exam. At present she is asymptomatic. She does travel quite a bit throughout the country, and she was encouraged to consider elective repair when her schedule would permit.  We reviewed the role of prosthetic mesh, in large part be determined by the size of the defect. Considering this is developed and a laparotomy incision, I would be inclined to make use of mesh reinforcement.    Hernia precautions and incarceration were discussed with the patient. If they develop symptoms of an incarcerated hernia, they were encouraged to seek prompt medical attention.  I have recommended repair of the hernia using mesh on an outpatient basis in the near future. The risk of infection was reviewed. The role of prosthetic mesh to minimize  the risk of recurrence was reviewed.   Call the office after looking at schedule for a date for hernia repair.  Continue self breast exams. Call office for any new breast issues or concerns.  The patient has been asked to return to the office in one year with a bilateral diagnostic mammogram.  The patient reports that she saw Georgeanne Nim, NP at the [facility] last summer with surprise she was still being followed by surgery. As the patient had only DCIS and has completed her period of hormone suppression, I'm unclear why she is being followed in medical oncology.    This information has been scribed by Gaspar Cola CMA.   Robert Bellow 05/29/2016, 12:52 PM

## 2016-05-29 NOTE — Addendum Note (Signed)
Addended by: Dominga Ferry on: 05/29/2016 01:20 PM   Modules accepted: Level of Service

## 2016-06-05 ENCOUNTER — Encounter: Payer: Self-pay | Admitting: *Deleted

## 2016-06-05 NOTE — Progress Notes (Signed)
Dr. Rogue Bussing reviewed Dr. Dwyane Luo office note, no further f/u is required in cancer center.

## 2016-11-20 ENCOUNTER — Encounter: Payer: Self-pay | Admitting: Obstetrics and Gynecology

## 2016-11-20 ENCOUNTER — Ambulatory Visit (INDEPENDENT_AMBULATORY_CARE_PROVIDER_SITE_OTHER): Payer: BLUE CROSS/BLUE SHIELD | Admitting: Obstetrics and Gynecology

## 2016-11-20 DIAGNOSIS — Z124 Encounter for screening for malignant neoplasm of cervix: Secondary | ICD-10-CM | POA: Diagnosis not present

## 2016-11-20 DIAGNOSIS — Z01419 Encounter for gynecological examination (general) (routine) without abnormal findings: Secondary | ICD-10-CM | POA: Diagnosis not present

## 2016-11-20 MED ORDER — NYSTATIN 100000 UNIT/GM EX CREA: 1.0000 "application " | TOPICAL_CREAM | Freq: Two times a day (BID) | CUTANEOUS | 1 refills | Status: DC

## 2016-11-20 NOTE — Patient Instructions (Signed)
Preventive Care 40-64 Years, Female Preventive care refers to lifestyle choices and visits with your health care provider that can promote health and wellness. What does preventive care include?  A yearly physical exam. This is also called an annual well check.  Dental exams once or twice a year.  Routine eye exams. Ask your health care provider how often you should have your eyes checked.  Personal lifestyle choices, including: ? Daily care of your teeth and gums. ? Regular physical activity. ? Eating a healthy diet. ? Avoiding tobacco and drug use. ? Limiting alcohol use. ? Practicing safe sex. ? Taking low-dose aspirin daily starting at age 58. ? Taking vitamin and mineral supplements as recommended by your health care provider. What happens during an annual well check? The services and screenings done by your health care provider during your annual well check will depend on your age, overall health, lifestyle risk factors, and family history of disease. Counseling Your health care provider may ask you questions about your:  Alcohol use.  Tobacco use.  Drug use.  Emotional well-being.  Home and relationship well-being.  Sexual activity.  Eating habits.  Work and work Statistician.  Method of birth control.  Menstrual cycle.  Pregnancy history.  Screening You may have the following tests or measurements:  Height, weight, and BMI.  Blood pressure.  Lipid and cholesterol levels. These may be checked every 5 years, or more frequently if you are over 81 years old.  Skin check.  Lung cancer screening. You may have this screening every year starting at age 78 if you have a 30-pack-year history of smoking and currently smoke or have quit within the past 15 years.  Fecal occult blood test (FOBT) of the stool. You may have this test every year starting at age 65.  Flexible sigmoidoscopy or colonoscopy. You may have a sigmoidoscopy every 5 years or a colonoscopy  every 10 years starting at age 30.  Hepatitis C blood test.  Hepatitis B blood test.  Sexually transmitted disease (STD) testing.  Diabetes screening. This is done by checking your blood sugar (glucose) after you have not eaten for a while (fasting). You may have this done every 1-3 years.  Mammogram. This may be done every 1-2 years. Talk to your health care provider about when you should start having regular mammograms. This may depend on whether you have a family history of breast cancer.  BRCA-related cancer screening. This may be done if you have a family history of breast, ovarian, tubal, or peritoneal cancers.  Pelvic exam and Pap test. This may be done every 3 years starting at age 80. Starting at age 36, this may be done every 5 years if you have a Pap test in combination with an HPV test.  Bone density scan. This is done to screen for osteoporosis. You may have this scan if you are at high risk for osteoporosis.  Discuss your test results, treatment options, and if necessary, the need for more tests with your health care provider. Vaccines Your health care provider may recommend certain vaccines, such as:  Influenza vaccine. This is recommended every year.  Tetanus, diphtheria, and acellular pertussis (Tdap, Td) vaccine. You may need a Td booster every 10 years.  Varicella vaccine. You may need this if you have not been vaccinated.  Zoster vaccine. You may need this after age 5.  Measles, mumps, and rubella (MMR) vaccine. You may need at least one dose of MMR if you were born in  1959 or later. You may also need a second dose.  Pneumococcal 13-valent conjugate (PCV13) vaccine. You may need this if you have certain conditions and were not previously vaccinated.  Pneumococcal polysaccharide (PPSV23) vaccine. You may need one or two doses if you smoke cigarettes or if you have certain conditions.  Meningococcal vaccine. You may need this if you have certain  conditions.  Hepatitis A vaccine. You may need this if you have certain conditions or if you travel or work in places where you may be exposed to hepatitis A.  Hepatitis B vaccine. You may need this if you have certain conditions or if you travel or work in places where you may be exposed to hepatitis B.  Haemophilus influenzae type b (Hib) vaccine. You may need this if you have certain conditions.  Talk to your health care provider about which screenings and vaccines you need and how often you need them. This information is not intended to replace advice given to you by your health care provider. Make sure you discuss any questions you have with your health care provider. Document Released: 03/19/2015 Document Revised: 11/10/2015 Document Reviewed: 12/22/2014 Elsevier Interactive Patient Education  2017 [employer].

## 2016-11-20 NOTE — Progress Notes (Signed)
Gynecology Annual Exam  PCP: Lavera Guise, MD  Chief Complaint:  Chief Complaint  Patient presents with  . Gynecologic Exam    look at hernia  . Rash    under right breast    History of Present Illness:Patient is a 61 y.o. G2P0011 presents for annual exam. The patient has no complaints today.   LMP: No LMP recorded. Patient is postmenopausal. No spotting, no significant vasomotor symptoms.  The patient is sexually active. She denies dyspareunia.  The patient does perform self breast exams.  There is notable personal history of breast cancer..  The patient wears seatbelts: yes.   The patient has regular exercise: not asked.    The patient denies current symptoms of depression.     Review of Systems: Review of Systems  Constitutional: Negative for chills and fever.  HENT: Negative for congestion.   Respiratory: Negative for cough and shortness of breath.   Cardiovascular: Negative for chest pain and palpitations.  Gastrointestinal: Negative for abdominal pain, constipation, diarrhea, heartburn, nausea and vomiting.  Genitourinary: Negative for dysuria, frequency and urgency.  Skin: Negative for itching and rash.  Neurological: Negative for dizziness and headaches.  Endo/Heme/Allergies: Negative for polydipsia.  Psychiatric/Behavioral: Negative for depression.    Past Medical History:  Past Medical History:  Diagnosis Date  . Asthma   . GERD (gastroesophageal reflux disease) 2008  . Hypertension   . Malignant neoplasm of upper-outer quadrant of female breast ([place]) 2011   Intermediate grade DCIS, ER: 50%; PR 10%. left breast lumpectomy, wide excision and a repeat wide excision on 03/10/2010 for multiple positive margins on original resection  . Malignant neoplasm of upper-outer quadrant of female breast [facility]) January 2012:    Re-excision to negative margins.   . Obesity, unspecified   . Postmenopausal bleeding 2013  . S/P radiation therapy 2012   whole breast,   .  Special screening for malignant neoplasms, colon     Past Surgical History:  Past Surgical History:  Procedure Laterality Date  . APPENDECTOMY  2008  . BREAST BIOPSY Left 2011   pt states had stereo done on mobile unit at Dr. Dwyane Luo office. DCIS  . BREAST LUMPECTOMY Left 02/2010   lumpectomy with re excision 03/2010 of left breast for cancer and rad tx  . BREAST SURGERY Left 02/17/2010;03/10/2010   lumpectomy and repeat wide excision  . CESAREAN SECTION    . COLON SURGERY Right 06/13/2006   Hand-assisted right hemicolectomy/polyp villous adenoma without atypia  . COLONOSCOPY  2010   Dr. Vira Agar  . ESOPHAGOGASTRODUODENOSCOPY (EGD) WITH PROPOFOL N/A 06/21/2015   Procedure: ESOPHAGOGASTRODUODENOSCOPY (EGD) WITH PROPOFOL;  Surgeon: Hulen Luster, MD;  Location: [facility] ENDOSCOPY;  Service: Gastroenterology;  Laterality: N/A;    Gynecologic History:  No LMP recorded. Patient is postmenopausal. Last Pap: Results were: 09/29/13 ASCUS with NEGATIVE high risk HPV  Last mammogram: 05/17/2016 Results were: BI-RAD II Obstetric History: G2P0011  Family History:  Family History  Problem Relation Age of Onset  . Cancer Father        colon  . Cancer Other        unknown family member with breast cancer  . Colon polyps Other        unknown family member with colon polyps    Social History:  Social History   Social History  . Marital status: Married    Spouse name: N/A  . Number of children: N/A  . Years of education: N/A   Occupational  History  . Not on file.   Social History Main Topics  . Smoking status: Never Smoker  . Smokeless tobacco: Never Used  . Alcohol use No  . Drug use: No  . Sexual activity: Yes    Birth control/ protection: Post-menopausal   Other Topics Concern  . Not on file   Social History Narrative  . No narrative on file    Allergies:  Allergies  Allergen Reactions  . Levaquin [Levofloxacin] Other (See Comments)    Severe tightness in left leg  .  Penicillins Rash    Medications: Prior to Admission medications   Medication Sig Start Date End Date Taking? Authorizing Provider  albuterol (PROVENTIL HFA;VENTOLIN HFA) 108 (90 Base) MCG/ACT inhaler Inhale 2 puffs into the lungs every 6 (six) hours as needed for wheezing or shortness of breath.   Yes [provider]  amLODipine (NORVASC) 2.5 MG tablet Take 2.5 mg by mouth daily.   Yes [provider]  aspirin 81 MG tablet Take 81 mg by mouth daily.   Yes [provider]  budesonide-formoterol (SYMBICORT) 80-4.5 MCG/ACT inhaler Inhale 2 puffs into the lungs 2 (two) times daily.   Yes [provider]  carvedilol (COREG) 25 MG tablet Take 25 mg by mouth 2 (two) times daily with a meal.   Yes [provider]  hydrochlorothiazide (HYDRODIURIL) 25 MG tablet Take 25 mg by mouth daily.   Yes [provider]  loratadine (CLARITIN) 10 MG tablet Take 10 mg by mouth daily.   Yes [provider]  montelukast (SINGULAIR) 10 MG tablet Take 10 mg by mouth at bedtime.   Yes [provider]  pantoprazole (PROTONIX) 40 MG tablet Take 1 tablet by mouth daily. 07/12/15  Yes [provider]  telmisartan (MICARDIS) 40 MG tablet Take 40 mg by mouth daily. 10/12/16  Yes [provider]    Physical Exam Vitals: Blood pressure 120/74, pulse 82, height 5' (1.524 m), weight 148 lb (67.1 kg).  General: NAD HEENT: normocephalic, anicteric Thyroid: no enlargement, no palpable nodules Pulmonary: No increased work of breathing, CTAB Cardiovascular: RRR, distal pulses 2+ Breast: Breast symmetrical, no tenderness, no palpable nodules or masses, no skin or nipple retraction present, no nipple discharge.  No axillary or supraclavicular lymphadenopathy. Abdomen: NABS, soft, non-tender, non-distended.  Umbilicus without lesions.  No hepatomegaly, splenomegaly or masses palpable. Small umbilical hernia. Genitourinary:  External: Normal  external female genitalia.  Normal urethral meatus, normal  Bartholin's and Skene's glands.    Vagina: Normal vaginal mucosa, no evidence of prolapse.    Cervix: Grossly normal in appearance, no bleeding  Uterus: Non-enlarged, mobile, normal contour.  No CMT  Adnexa: ovaries non-enlarged, no adnexal masses  Rectal: deferred  Lymphatic: no evidence of inguinal lymphadenopathy Extremities: no edema, erythema, or tenderness Neurologic: Grossly intact Psychiatric: mood appropriate, affect full  Female chaperone present for pelvic and breast  portions of the physical exam     Assessment: 61 y.o. G2P0011 routine annual exam  Plan: Problem List Items Addressed This Visit    None    Visit Diagnoses    Screening for malignant neoplasm of cervix       Relevant Orders   PapIG, HPV, rfx 16/18   Encounter for gynecological examination without abnormal finding       Relevant Orders   PapIG, HPV, rfx 16/18      1) Mammogram - recommend yearly screening mammogram.  Mammogram Is up to date  2) STI screening was not  offered  3) ASCCP guidelines and rational discussed.  Patient opts for yearly screening interval  4) Osteoporosis  - per USPTF routine screening DEXA at age 63 - FRAX 73 year major fracture risk 7.5%,  10 year hip fracture risk 0.7%  Consider FDA-approved medical therapies in postmenopausal women and men aged 53 years and older, based on the following: a) A hip or vertebral (clinical or morphometric) fracture b) T-score ? -2.5 at the femoral neck or spine after appropriate evaluation to exclude secondary causes C) Low bone mass (T-score between -1.0 and -2.5 at the femoral neck or spine) and a 10-year probability of a hip fracture ? 3% or a 10-year probability of a major osteoporosis-related fracture ? 20% based on the [place]-adapted WHO algorithm   5) Routine healthcare maintenance including cholesterol, diabetes screening discussed managed by PCP  6) Colonoscopy Screening  recommended starting at age 55 for average risk individuals, age 15 for individuals deemed at increased risk (including African Americans) and recommended to continue until age 80.  For patient age 27-85 individualized approach is recommended.  Gold standard screening is via colonoscopy, Cologuard screening is an acceptable alternative for patient unwilling or unable to undergo colonoscopy.  "Colorectal cancer screening for average?risk adults: 2018 guideline update from the American Cancer Society"CA: A Cancer Journal for Clinicians: Aug 02, 2016  - 05/30/06 Villious adenoma - 09/09/13 normal with repeat in 5 years 8403  7) Umbilical hernia - is follow by Dr. Harriet Butte up 1 year for routine annual

## 2016-11-22 ENCOUNTER — Encounter: Payer: Self-pay | Admitting: Obstetrics and Gynecology

## 2016-11-22 LAB — PAPIG, HPV, RFX 16/18
HPV, HIGH-RISK: NEGATIVE
PAP Smear Comment: 0

## 2016-11-27 ENCOUNTER — Telehealth: Payer: Self-pay

## 2016-11-27 NOTE — Telephone Encounter (Signed)
-----   Message from Carson Myrtle, RN sent at 11/23/2016  4:42 PM EDT ----- Regarding: FW: FYI Check with her on Friday, please. Thanks Rosann Auerbach ----- Message ----- From: Robert Bellow, MD Sent: 11/23/2016   2:29 PM To: Carson Myrtle, RN Subject: Melton Alar: FYI                                        Dr. Georgianne Fick sent a note about the patient being more symptomatic from her ventral hernia.  See if she is ready for repair. Thanks.  ----- Message ----- From: Malachy Mood, MD Sent: 11/21/2016  10:52 PM To: Robert Bellow, MD Subject: Lorie Phenix saw Mrs Poch this week, she is stating she is having a little more discomfort with her hernia and you had perviously discussed surgical options with her and she seems interested now.  She doesn't exhibit any symptoms concerning for incarceration and her exam was benign

## 2016-12-20 ENCOUNTER — Ambulatory Visit (INDEPENDENT_AMBULATORY_CARE_PROVIDER_SITE_OTHER): Payer: BLUE CROSS/BLUE SHIELD | Admitting: General Surgery

## 2016-12-20 ENCOUNTER — Encounter: Payer: Self-pay | Admitting: General Surgery

## 2016-12-20 VITALS — BP 128/78 | HR 88 | Resp 12 | Ht 60.0 in | Wt 149.0 lb

## 2016-12-20 DIAGNOSIS — K439 Ventral hernia without obstruction or gangrene: Secondary | ICD-10-CM

## 2016-12-20 NOTE — Progress Notes (Signed)
Patient ID: Stacie Ellis, female   DOB: 1955/03/15, 61 y.o.   MRN: [id number]  Chief Complaint  Patient presents with  . Follow-up    Ventral Hernia    HPI Irmalee Ellis is a 61 y.o. female here today for follow-up of ventral hernia. Patient states that area has a buldge but denies pain. Moves her bowels daily without difficulty. No change since last visit. Patient has been wearing an abdominal binder to help with discomfort.    HPI  Past Medical History:  Diagnosis Date  . Asthma   . GERD (gastroesophageal reflux disease) 2008  . Hypertension   . Malignant neoplasm of upper-outer quadrant of female breast ([place]) 2011   Intermediate grade DCIS, ER: 50%; PR 10%. left breast lumpectomy, wide excision and a repeat wide excision on 03/10/2010 for multiple positive margins on original resection  . Malignant neoplasm of upper-outer quadrant of female breast [facility]) January 2012:    Re-excision to negative margins.   . Obesity, unspecified   . Postmenopausal bleeding 2013  . S/P radiation therapy 2012   whole breast,   . Special screening for malignant neoplasms, colon     Past Surgical History:  Procedure Laterality Date  . APPENDECTOMY  2008  . BREAST BIOPSY Left 2011   pt states had stereo done on mobile unit at Dr. Dwyane Luo office. DCIS  . BREAST LUMPECTOMY Left 02/2010   lumpectomy with re excision 03/2010 of left breast for cancer and rad tx  . BREAST SURGERY Left 02/17/2010;03/10/2010   lumpectomy and repeat wide excision  . CESAREAN SECTION    . COLON SURGERY Right 06/13/2006   Hand-assisted right hemicolectomy. 5.8 cm villous adenoma without atypia  . COLONOSCOPY  2010   Dr. Vira Agar  . ESOPHAGOGASTRODUODENOSCOPY (EGD) WITH PROPOFOL N/A 06/21/2015   Procedure: ESOPHAGOGASTRODUODENOSCOPY (EGD) WITH PROPOFOL;  Surgeon: Hulen Luster, MD;  Location: [facility] ENDOSCOPY;  Service: Gastroenterology;  Laterality: N/A;    Family History  Problem Relation Age of Onset  . Cancer  Father        colon  . Cancer Other        unknown family member with breast cancer  . Colon polyps Other        unknown family member with colon polyps    Social History Social History  Substance Use Topics  . Smoking status: Never Smoker  . Smokeless tobacco: Never Used  . Alcohol use No    Allergies  Allergen Reactions  . Levaquin [Levofloxacin] Other (See Comments)    Severe tightness in left leg  . Penicillins Rash    Current Outpatient Prescriptions  Medication Sig Dispense Refill  . albuterol (PROVENTIL HFA;VENTOLIN HFA) 108 (90 Base) MCG/ACT inhaler Inhale 2 puffs into the lungs every 6 (six) hours as needed for wheezing or shortness of breath.    Marland Kitchen amLODipine (NORVASC) 2.5 MG tablet Take 2.5 mg by mouth daily.    Marland Kitchen aspirin 81 MG tablet Take 81 mg by mouth daily.    . budesonide-formoterol (SYMBICORT) 80-4.5 MCG/ACT inhaler Inhale 2 puffs into the lungs 2 (two) times daily.    . carvedilol (COREG) 25 MG tablet Take 25 mg by mouth 2 (two) times daily with a meal.    . hydrochlorothiazide (HYDRODIURIL) 25 MG tablet Take 25 mg by mouth daily.    Marland Kitchen loratadine (CLARITIN) 10 MG tablet Take 10 mg by mouth daily.    . montelukast (SINGULAIR) 10 MG tablet Take 10 mg by mouth at bedtime.    Marland Kitchen  nystatin cream (MYCOSTATIN) Apply 1 application topically 2 (two) times daily. 30 g 1  . pantoprazole (PROTONIX) 40 MG tablet Take 1 tablet by mouth daily.  0  . telmisartan (MICARDIS) 20 MG tablet Take 1 tablet by mouth daily.  0   No current facility-administered medications for this visit.     Review of Systems Review of Systems  Constitutional: Negative.   Respiratory: Negative.   Cardiovascular: Negative.     Blood pressure 128/78, pulse 88, resp. rate 12, height 5' (1.524 m), weight 149 lb (67.6 kg).  Physical Exam Physical Exam  Constitutional: She is oriented to person, place, and time. She appears well-developed and well-nourished.  Eyes: Conjunctivae are normal. No  scleral icterus.  Neck: Neck supple.  Cardiovascular: Normal rate, regular rhythm and normal heart sounds.   Pulmonary/Chest: Effort normal and breath sounds normal.  Abdominal: Soft. Normal appearance and bowel sounds are normal. There is no splenomegaly or hepatomegaly. There is no tenderness. A hernia is present. Hernia confirmed positive in the ventral area.    Lymphadenopathy:    She has no cervical adenopathy.  Neurological: She is alert and oriented to person, place, and time.  Skin: Skin is warm and dry.       Assessment    Ventral hernia, minimally symptomatic.    Plan         Discussed options with patient. Patient decided to wait on hernia repair The patient is aware to call back for any questions or concerns. Return as scheduled in February 2019 for breast follow up.    HPI, Physical Exam, Assessment and Plan have been scribed under the direction and in the presence of Hervey Ard, MD.  Reece Packer, RN  I have completed the exam and reviewed the above documentation for accuracy and completeness.  I agree with the above.  Haematologist has been used and any errors in dictation or transcription are unintentional.  Hervey Ard, M.D., F.A.C.S.  Robert Bellow 12/21/2016, 5:29 PM

## 2016-12-20 NOTE — Patient Instructions (Signed)
Discussed options with patient. Patient decided to wait on hernia repair The patient is aware to call back for any questions or concerns. Return as scheduled.

## 2016-12-21 ENCOUNTER — Encounter: Payer: Self-pay | Admitting: General Surgery

## 2017-01-03 ENCOUNTER — Encounter: Payer: Self-pay | Admitting: General Surgery

## 2017-03-13 ENCOUNTER — Other Ambulatory Visit: Payer: Self-pay | Admitting: Internal Medicine

## 2017-04-04 ENCOUNTER — Other Ambulatory Visit: Payer: Self-pay

## 2017-04-04 DIAGNOSIS — Z853 Personal history of malignant neoplasm of breast: Secondary | ICD-10-CM

## 2017-04-10 ENCOUNTER — Other Ambulatory Visit: Payer: Self-pay

## 2017-04-10 DIAGNOSIS — Z1231 Encounter for screening mammogram for malignant neoplasm of breast: Secondary | ICD-10-CM

## 2017-05-22 ENCOUNTER — Other Ambulatory Visit: Payer: Self-pay | Admitting: General Surgery

## 2017-05-22 ENCOUNTER — Ambulatory Visit
Admission: RE | Admit: 2017-05-22 | Discharge: 2017-05-22 | Disposition: A | Discharge status: Home / Self Care | Payer: BLUE CROSS/BLUE SHIELD | Source: Ambulatory Visit | Attending: General Surgery | Admitting: General Surgery

## 2017-05-22 DIAGNOSIS — Z1231 Encounter for screening mammogram for malignant neoplasm of breast: Secondary | ICD-10-CM | POA: Diagnosis present

## 2017-05-22 HISTORY — DX: Malignant neoplasm of unspecified site of unspecified female breast: C50.919

## 2017-05-24 ENCOUNTER — Ambulatory Visit: Payer: BLUE CROSS/BLUE SHIELD | Admitting: General Surgery

## 2017-05-29 ENCOUNTER — Ambulatory Visit: Payer: BLUE CROSS/BLUE SHIELD | Admitting: General Surgery

## 2017-06-01 ENCOUNTER — Encounter: Payer: Self-pay | Admitting: Nurse Practitioner

## 2017-06-01 ENCOUNTER — Ambulatory Visit: Payer: BLUE CROSS/BLUE SHIELD | Admitting: Nurse Practitioner

## 2017-06-01 VITALS — BP 131/87 | HR 90 | Temp 98.6°F | Resp 16 | Ht 60.0 in | Wt 156.2 lb

## 2017-06-01 DIAGNOSIS — R059 Cough, unspecified: Secondary | ICD-10-CM

## 2017-06-01 DIAGNOSIS — J069 Acute upper respiratory infection, unspecified: Secondary | ICD-10-CM

## 2017-06-01 DIAGNOSIS — R05 Cough: Secondary | ICD-10-CM | POA: Diagnosis not present

## 2017-06-01 DIAGNOSIS — J301 Allergic rhinitis due to pollen: Secondary | ICD-10-CM

## 2017-06-01 MED ORDER — AZITHROMYCIN 250 MG PO TABS: ORAL_TABLET | ORAL | 0 refills | Status: DC

## 2017-06-01 MED ORDER — BENZONATATE 200 MG PO CAPS: 200.0000 mg | ORAL_CAPSULE | Freq: Three times a day (TID) | ORAL | 1 refills | Status: DC | PRN

## 2017-06-01 NOTE — Progress Notes (Signed)
[facility] [address]  Internal MEDICINE  Office Visit Note  Patient Name: Stacie Ellis  [id number]  [id number]  Date of Service: 06/27/2017   Pt is here for a sick visit.  Chief Complaint  Patient presents with  . chest congestion    using mucinex dm, nebulizer ...started over the weekend getting worse last two days  . Cough  . Fever     The patient is here for sick visit. She has cough and congestion. Has been present for about a week. No fever. Gotten worse over past few days. Has been taking mucinex DM to help symptoms. Waking her up at night.        Current Medication:  Outpatient Encounter Medications as of 06/01/2017  Medication Sig  . albuterol (PROVENTIL HFA;VENTOLIN HFA) 108 (90 Base) MCG/ACT inhaler Inhale 2 puffs into the lungs every 6 (six) hours as needed for wheezing or shortness of breath.  Marland Kitchen amLODipine (NORVASC) 2.5 MG tablet Take 2.5 mg by mouth daily.  Marland Kitchen aspirin 81 MG tablet Take 81 mg by mouth daily.  Marland Kitchen atorvastatin (LIPITOR) 10 MG tablet Take 10 mg by mouth daily.  . budesonide-formoterol (SYMBICORT) 80-4.5 MCG/ACT inhaler Inhale 2 puffs into the lungs 2 (two) times daily.  . carvedilol (COREG) 25 MG tablet Take 25 mg by mouth 2 (two) times daily with a meal.  . hydrochlorothiazide (HYDRODIURIL) 25 MG tablet Take 25 mg by mouth daily.  Marland Kitchen loratadine (CLARITIN) 10 MG tablet Take 10 mg by mouth daily.  . montelukast (SINGULAIR) 10 MG tablet Take 10 mg by mouth at bedtime.  . pantoprazole (PROTONIX) 40 MG tablet take 1 tablet by mouth once daily  . telmisartan (MICARDIS) 20 MG tablet Take 1 tablet by mouth daily.  . [DISCONTINUED] guaiFENesin (MUCINEX) 600 MG 12 hr tablet Take by mouth 2 (two) times daily.  Marland Kitchen nystatin cream (MYCOSTATIN) Apply 1 application topically 2 (two) times daily.  . [DISCONTINUED] azithromycin (ZITHROMAX) 250 MG tablet z-pack - take as directed for 5 days  . [DISCONTINUED]  benzonatate (TESSALON) 200 MG capsule Take 1 capsule (200 mg total) by mouth 3 (three) times daily as needed for cough.   No facility-administered encounter medications on file as of 06/01/2017.       Medical History: Past Medical History:  Diagnosis Date  . Asthma   . Breast cancer ([place]) 02/2010  . GERD (gastroesophageal reflux disease) 2008  . Hypertension   . Malignant neoplasm of upper-outer quadrant of female breast ([place]) 2011   Intermediate grade DCIS, ER: 50%; PR 10%. left breast lumpectomy, wide excision and a repeat wide excision on 03/10/2010 for multiple positive margins on original resection  . Malignant neoplasm of upper-outer quadrant of female breast [facility]) January 2012:    Re-excision to negative margins.   . Obesity, unspecified   . Postmenopausal bleeding 2013  . S/P radiation therapy 2012   whole breast,   . Special screening for malignant neoplasms, colon      Today's Vitals   06/01/17 1152  BP: 131/87  Pulse: 90  Resp: 16  Temp: 98.6 F (37 C)  SpO2: 95%  Weight: 156 lb 3.2 oz (70.9 kg)  Height: 5' (1.524 m)    Review of Systems  Constitutional: Positive for activity change and fatigue. Negative for chills and unexpected weight change.  HENT: Positive for congestion, postnasal drip, rhinorrhea, sinus pressure and sinus pain. Negative for sneezing, sore throat and voice change.  Eyes: Negative.  Negative for redness.  Respiratory: Positive for cough, shortness of breath and wheezing. Negative for chest tightness.   Cardiovascular: Negative for chest pain and palpitations.  Gastrointestinal: Positive for nausea. Negative for abdominal pain, constipation, diarrhea and vomiting.  Endocrine: Negative for cold intolerance, heat intolerance, polydipsia, polyphagia and polyuria.  Genitourinary: Negative.  Negative for dysuria and frequency.  Musculoskeletal: Positive for myalgias. Negative for arthralgias, back pain, joint swelling and neck pain.  Skin:  Negative for rash.  Allergic/Immunologic: Positive for environmental allergies.  Neurological: Positive for headaches. Negative for dizziness, tremors, weakness and numbness.  Hematological: Positive for adenopathy. Does not bruise/bleed easily.  Psychiatric/Behavioral: Negative.  Negative for behavioral problems (Depression), sleep disturbance and suicidal ideas. The patient is not nervous/anxious.     Physical Exam  Constitutional: She is oriented to person, place, and time. She appears well-developed and well-nourished. No distress.  HENT:  Head: Normocephalic and atraumatic.  Right Ear: Tympanic membrane is bulging.  Left Ear: Tympanic membrane is bulging.  Nose: Rhinorrhea present.  Mouth/Throat: Posterior oropharyngeal erythema present. No oropharyngeal exudate.  Eyes: Pupils are equal, round, and reactive to light. EOM are normal.  Neck: Normal range of motion. Neck supple. No JVD present. No tracheal deviation present. No thyromegaly present.  Cardiovascular: Normal rate, regular rhythm and normal heart sounds. Exam reveals no gallop and no friction rub.  No murmur heard. Pulmonary/Chest: Effort normal and breath sounds normal. No respiratory distress. She has no wheezes. She has no rales. She exhibits no tenderness.  Congested, non-productive cough.  Abdominal: Soft. Bowel sounds are normal. There is no tenderness.  Musculoskeletal: Normal range of motion.  Lymphadenopathy:    She has cervical adenopathy.  Neurological: She is alert and oriented to person, place, and time. No cranial nerve deficit.  Skin: Skin is warm and dry. She is not diaphoretic.  Psychiatric: She has a normal mood and affect. Her behavior is normal. Judgment and thought content normal.  Nursing note and vitals reviewed.  Assessment/Plan: 1. Acute upper respiratory infection z-pack - take as directed for 5 days. otc medications to alleviate symptoms.   2. Cough Benzonatate 200mg  may be taken up to  three times daily as needed for cough.   3. Allergic rhinitis due to pollen, unspecified seasonality Continue all allergy medication a prescribed.   General Counseling: aaylah pokorny understanding of the findings of todays visit and agrees with plan of treatment. I have discussed any further diagnostic evaluation that may be needed or ordered today. We also reviewed her medications today. she has been encouraged to call the office with any questions or concerns that should arise related to todays visit.  Rest and increase fluids. Continue using OTC medication to control symptoms.   This patient was seen by Leretha Pol, FNP- C in Collaboration with Dr Lavera Guise as a part of collaborative care agreement  Meds ordered this encounter  Medications  . DISCONTD: azithromycin (ZITHROMAX) 250 MG tablet    Sig: z-pack - take as directed for 5 days    Dispense:  6 tablet    Refill:  0    Order Specific Question:   Supervising Provider    Answer:   Lavera Guise [place]  . DISCONTD: benzonatate (TESSALON) 200 MG capsule    Sig: Take 1 capsule (200 mg total) by mouth 3 (three) times daily as needed for cough.    Dispense:  30 capsule    Refill:  1    Order Specific  Question:   Supervising Provider    Answer:   Lavera Guise [[id number]]    Time spent: 15 Minutes

## 2017-06-05 ENCOUNTER — Telehealth: Payer: Self-pay

## 2017-06-06 ENCOUNTER — Other Ambulatory Visit: Payer: Self-pay | Admitting: Nurse Practitioner

## 2017-06-06 DIAGNOSIS — R05 Cough: Secondary | ICD-10-CM

## 2017-06-06 DIAGNOSIS — J069 Acute upper respiratory infection, unspecified: Secondary | ICD-10-CM

## 2017-06-06 DIAGNOSIS — R059 Cough, unspecified: Secondary | ICD-10-CM

## 2017-06-06 MED ORDER — SULFAMETHOXAZOLE-TRIMETHOPRIM 800-160 MG PO TABS: 1.0000 | ORAL_TABLET | Freq: Two times a day (BID) | ORAL | 0 refills | Status: DC

## 2017-06-06 MED ORDER — METHYLPREDNISOLONE 4 MG PO TBPK: ORAL_TABLET | ORAL | 0 refills | Status: DC

## 2017-06-06 NOTE — Telephone Encounter (Signed)
LMOM PT THAT WE SEND ANTIBIOTIC

## 2017-06-06 NOTE — Progress Notes (Signed)
Patient still sick after treatment with z-pack. Will do round of bactrim DS twice daily for 10 days. Added medrol 6 day dose pack. Take as directed for 6 days. Continue to use OTC medication to treat symptoms.

## 2017-06-06 NOTE — Telephone Encounter (Signed)
Patient still sick after treatment with z-pack. Will do round of bactrim DS twice daily for 10 days. Added medrol 6 day dose pack. Take as directed for 6 days. Continue to use OTC medication to treat symptoms.

## 2017-06-21 ENCOUNTER — Ambulatory Visit: Payer: BLUE CROSS/BLUE SHIELD | Admitting: General Surgery

## 2017-06-21 ENCOUNTER — Encounter: Payer: Self-pay | Admitting: General Surgery

## 2017-06-21 VITALS — BP 140/80 | HR 72 | Resp 12 | Ht 60.0 in | Wt 156.0 lb

## 2017-06-21 DIAGNOSIS — C50412 Malignant neoplasm of upper-outer quadrant of left female breast: Secondary | ICD-10-CM

## 2017-06-21 DIAGNOSIS — Z170 Estrogen receptor positive status [ER+]: Secondary | ICD-10-CM | POA: Diagnosis not present

## 2017-06-21 DIAGNOSIS — K439 Ventral hernia without obstruction or gangrene: Secondary | ICD-10-CM | POA: Diagnosis not present

## 2017-06-21 NOTE — Patient Instructions (Addendum)
Patient will be asked to return to the office in one year with a bilateral screening mammogram. The patient is aware to call back for any questions or concerns. 

## 2017-06-21 NOTE — Progress Notes (Signed)
Patient ID: Stacie Ellis, female   DOB: Dec 11, 1955, 62 y.o.   MRN: [id number]  Chief Complaint  Patient presents with  . Follow-up    HPI [name] is a 62 y.o. female who presents for follow up left breast cancer and a breast evaluation. The most recent mammogram was done on 05/18/2017.  Patient does perform regular self breast checks and gets regular mammograms done.   No new breast issues. She states the left breast tenderness remains unchanged. She still has a little cough, recovering from bronchitis in March. She states the hernia bothers her some when she cough a lot.  HPI  Past Medical History:  Diagnosis Date  . Asthma   . Breast cancer ([place]) 02/2010  . GERD (gastroesophageal reflux disease) 2008  . Hypertension   . Malignant neoplasm of upper-outer quadrant of female breast ([place]) 2011   Intermediate grade DCIS, ER: 50%; PR 10%. left breast lumpectomy, wide excision and a repeat wide excision on 03/10/2010 for multiple positive margins on original resection  . Malignant neoplasm of upper-outer quadrant of female breast [facility]) January 2012:    Re-excision to negative margins.   . Obesity, unspecified   . Postmenopausal bleeding 2013  . S/P radiation therapy 2012   whole breast,   . Special screening for malignant neoplasms, colon     Past Surgical History:  Procedure Laterality Date  . APPENDECTOMY  2008  . BREAST BIOPSY Left 2011   pt states had stereo done on mobile unit at Dr. Dwyane Luo office. DCIS  . BREAST LUMPECTOMY Left 02/2010   lumpectomy with re excision 03/2010 of left breast for cancer and rad tx  . BREAST SURGERY Left 02/17/2010;03/10/2010   lumpectomy and repeat wide excision  . CESAREAN SECTION    . COLON SURGERY Right 06/13/2006   Hand-assisted right hemicolectomy. 5.8 cm villous adenoma without atypia  . COLONOSCOPY  2010   Dr. Vira Agar  . ESOPHAGOGASTRODUODENOSCOPY (EGD) WITH PROPOFOL N/A 06/21/2015   Procedure: ESOPHAGOGASTRODUODENOSCOPY  (EGD) WITH PROPOFOL;  Surgeon: Hulen Luster, MD;  Location: [facility] ENDOSCOPY;  Service: Gastroenterology;  Laterality: N/A;    Family History  Problem Relation Age of Onset  . Cancer Father        colon  . Cancer Other        unknown family member with breast cancer  . Colon polyps Other        unknown family member with colon polyps    Social History Social History   Tobacco Use  . Smoking status: Never Smoker  . Smokeless tobacco: Never Used  Substance Use Topics  . Alcohol use: No  . Drug use: No    Allergies  Allergen Reactions  . Levaquin [Levofloxacin] Other (See Comments)    Severe tightness in left leg  . Penicillins Rash    Current Outpatient Medications  Medication Sig Dispense Refill  . albuterol (PROVENTIL HFA;VENTOLIN HFA) 108 (90 Base) MCG/ACT inhaler Inhale 2 puffs into the lungs every 6 (six) hours as needed for wheezing or shortness of breath.    Marland Kitchen amLODipine (NORVASC) 2.5 MG tablet Take 2.5 mg by mouth daily.    Marland Kitchen aspirin 81 MG tablet Take 81 mg by mouth daily.    Marland Kitchen atorvastatin (LIPITOR) 10 MG tablet Take 10 mg by mouth daily.    . budesonide-formoterol (SYMBICORT) 80-4.5 MCG/ACT inhaler Inhale 2 puffs into the lungs 2 (two) times daily.    . carvedilol (COREG) 25 MG tablet Take 25 mg by  mouth 2 (two) times daily with a meal.    . hydrochlorothiazide (HYDRODIURIL) 25 MG tablet Take 25 mg by mouth daily.    Marland Kitchen loratadine (CLARITIN) 10 MG tablet Take 10 mg by mouth daily.    . montelukast (SINGULAIR) 10 MG tablet Take 10 mg by mouth at bedtime.    Marland Kitchen nystatin cream (MYCOSTATIN) Apply 1 application topically 2 (two) times daily. 30 g 1  . pantoprazole (PROTONIX) 40 MG tablet take 1 tablet by mouth once daily 30 tablet 3  . telmisartan (MICARDIS) 20 MG tablet Take 1 tablet by mouth daily.  0   No current facility-administered medications for this visit.     Review of Systems Review of Systems  Constitutional: Negative.   Respiratory: Positive for cough.    Cardiovascular: Negative.     Blood pressure 140/80, pulse 72, resp. rate 12, height 5' (1.524 m), weight 156 lb (70.8 kg), SpO2 98 %.  Physical Exam Physical Exam  Constitutional: She is oriented to person, place, and time. She appears well-developed and well-nourished.  HENT:  Mouth/Throat: Oropharynx is clear and moist.  Eyes: Conjunctivae are normal. No scleral icterus.  Neck: Neck supple.  Cardiovascular: Normal rate, regular rhythm and normal heart sounds.  Pulmonary/Chest: Effort normal and breath sounds normal. Right breast exhibits no inverted nipple, no mass, no nipple discharge, no skin change and no tenderness. Left breast exhibits no inverted nipple, no mass, no nipple discharge, no skin change and no tenderness.    Left breast < right breast. Left breast incision well healed.  Abdominal: Soft. Normal appearance. There is no tenderness. A hernia is present. Hernia confirmed positive in the ventral area.    Ventral hernia stable  Lymphadenopathy:    She has no cervical adenopathy.    She has no axillary adenopathy.  Neurological: She is alert and oriented to person, place, and time.  Skin: Skin is warm and dry.  Psychiatric: Her behavior is normal.    Data Reviewed Bilateral screening mammograms dated May 18, 2017 showed postsurgical change.  Scarring consistent with previous radiation.  BI-RADS-1.  These films were reviewed.  Assessment    No evidence of recurrent breast cancer.  Candidate for follow-up colonoscopy in 2020.  Stable ventral hernia.    Plan  The patient will contact the office if she becomes symptomatic from her ventral hernia.  Patient will be asked to return to the office in one year with a bilateral screening mammogram. The patient is aware to call back for any questions or concerns.   HPI, Physical Exam, Assessment and Plan have been scribed under the direction and in the presence of Robert Bellow, MD. Karie Fetch, RN  I  have completed the exam and reviewed the above documentation for accuracy and completeness.  I agree with the above.  Haematologist has been used and any errors in dictation or transcription are unintentional.  Hervey Ard, M.D., F.A.C.S.  Forest Gleason Estefan Pattison 06/22/2017, 7:04 AM

## 2017-06-22 ENCOUNTER — Encounter: Payer: Self-pay | Admitting: General Surgery

## 2017-06-27 DIAGNOSIS — R05 Cough: Secondary | ICD-10-CM | POA: Insufficient documentation

## 2017-06-27 DIAGNOSIS — R059 Cough, unspecified: Secondary | ICD-10-CM | POA: Insufficient documentation

## 2017-06-27 DIAGNOSIS — J301 Allergic rhinitis due to pollen: Secondary | ICD-10-CM | POA: Insufficient documentation

## 2017-06-27 DIAGNOSIS — J069 Acute upper respiratory infection, unspecified: Secondary | ICD-10-CM | POA: Insufficient documentation

## 2017-07-12 ENCOUNTER — Encounter: Payer: Self-pay | Admitting: Nurse Practitioner

## 2017-07-12 ENCOUNTER — Ambulatory Visit: Payer: BLUE CROSS/BLUE SHIELD | Admitting: Nurse Practitioner

## 2017-07-12 VITALS — BP 130/80 | HR 78 | Resp 16 | Ht 60.0 in | Wt 147.0 lb

## 2017-07-12 DIAGNOSIS — E559 Vitamin D deficiency, unspecified: Secondary | ICD-10-CM | POA: Diagnosis not present

## 2017-07-12 DIAGNOSIS — E782 Mixed hyperlipidemia: Secondary | ICD-10-CM | POA: Diagnosis not present

## 2017-07-12 DIAGNOSIS — R7301 Impaired fasting glucose: Secondary | ICD-10-CM | POA: Diagnosis not present

## 2017-07-12 DIAGNOSIS — I10 Essential (primary) hypertension: Secondary | ICD-10-CM | POA: Diagnosis not present

## 2017-07-12 DIAGNOSIS — J301 Allergic rhinitis due to pollen: Secondary | ICD-10-CM

## 2017-07-12 MED ORDER — AMLODIPINE BESYLATE 2.5 MG PO TABS: 2.5000 mg | ORAL_TABLET | Freq: Two times a day (BID) | ORAL | 3 refills | Status: DC

## 2017-07-12 NOTE — Progress Notes (Signed)
[facility] [place], [place] [address]  Internal MEDICINE  Office Visit Note  Patient Name: Stacie Ellis  [id number]  [id number]  Date of Service: 08/05/2017    Pt is here for routine follow up.   Chief Complaint  Patient presents with  . Hypertension    Hypertension  This is a chronic problem. The current episode started more than 1 year ago. The problem is unchanged. The problem is controlled. Pertinent negatives include no chest pain, headaches, neck pain, palpitations or shortness of breath. Risk factors for coronary artery disease include dyslipidemia and post-menopausal state. Past treatments include beta blockers, calcium channel blockers, diuretics and angiotensin blockers. The current treatment provides moderate improvement. There are no compliance problems.        Current Medication: Outpatient Encounter Medications as of 07/12/2017  Medication Sig  . albuterol (PROVENTIL HFA;VENTOLIN HFA) 108 (90 Base) MCG/ACT inhaler Inhale 2 puffs into the lungs every 6 (six) hours as needed for wheezing or shortness of breath.  Marland Kitchen amLODipine (NORVASC) 2.5 MG tablet Take 1 tablet (2.5 mg total) by mouth 2 (two) times daily.  Marland Kitchen aspirin 81 MG tablet Take 81 mg by mouth daily.  Marland Kitchen atorvastatin (LIPITOR) 10 MG tablet Take 10 mg by mouth daily.  . budesonide-formoterol (SYMBICORT) 80-4.5 MCG/ACT inhaler Inhale 2 puffs into the lungs 2 (two) times daily.  . carvedilol (COREG) 25 MG tablet Take 25 mg by mouth 2 (two) times daily with a meal.  . hydrochlorothiazide (HYDRODIURIL) 25 MG tablet Take 25 mg by mouth daily.  Marland Kitchen loratadine (CLARITIN) 10 MG tablet Take 10 mg by mouth daily.  . montelukast (SINGULAIR) 10 MG tablet Take 10 mg by mouth at bedtime.  Marland Kitchen nystatin cream (MYCOSTATIN) Apply 1 application topically 2 (two) times daily.  Marland Kitchen telmisartan (MICARDIS) 20 MG tablet Take 1 tablet by mouth daily.  . [DISCONTINUED] amLODipine (NORVASC) 2.5 MG tablet Take 2.5 mg  by mouth 2 (two) times daily.   . [DISCONTINUED] pantoprazole (PROTONIX) 40 MG tablet take 1 tablet by mouth once daily   No facility-administered encounter medications on file as of 07/12/2017.     Surgical History: Past Surgical History:  Procedure Laterality Date  . APPENDECTOMY  2008  . BREAST BIOPSY Left 2011   pt states had stereo done on mobile unit at Dr. Dwyane Luo office. DCIS  . BREAST LUMPECTOMY Left 02/2010   lumpectomy with re excision 03/2010 of left breast for cancer and rad tx  . BREAST SURGERY Left 02/17/2010;03/10/2010   lumpectomy and repeat wide excision  . CESAREAN SECTION    . COLON SURGERY Right 06/13/2006   Hand-assisted right hemicolectomy. 5.8 cm villous adenoma without atypia  . COLONOSCOPY  2015   Normal exam.  . ESOPHAGOGASTRODUODENOSCOPY (EGD) WITH PROPOFOL N/A 06/21/2015   Procedure: ESOPHAGOGASTRODUODENOSCOPY (EGD) WITH PROPOFOL;  Surgeon: Hulen Luster, MD;  Location: [facility] ENDOSCOPY;  Service: Gastroenterology;  Laterality: N/A;    Medical History: Past Medical History:  Diagnosis Date  . Asthma   . Breast cancer ([place]) 02/2010  . GERD (gastroesophageal reflux disease) 2008  . Hypertension   . Malignant neoplasm of upper-outer quadrant of female breast ([place]) 2011   Intermediate grade DCIS, ER: 50%; PR 10%. left breast lumpectomy, wide excision and a repeat wide excision on 03/10/2010 for multiple positive margins on original resection  . Malignant neoplasm of upper-outer quadrant of female breast [facility]) January 2012:    Re-excision to negative margins.   . Obesity, unspecified   .  Postmenopausal bleeding 2013  . S/P radiation therapy 2012   whole breast,   . Special screening for malignant neoplasms, colon     Family History: Family History  Problem Relation Age of Onset  . Cancer Father        colon  . Cancer Other        unknown family member with breast cancer  . Colon polyps Other        unknown family member with colon polyps    Social  History   Socioeconomic History  . Marital status: Married    Spouse name: Not on file  . Number of children: Not on file  . Years of education: Not on file  . Highest education level: Not on file  Occupational History  . Not on file  Social Needs  . Financial resource strain: Not on file  . Food insecurity:    Worry: Not on file    Inability: Not on file  . Transportation needs:    Medical: Not on file    Non-medical: Not on file  Tobacco Use  . Smoking status: Never Smoker  . Smokeless tobacco: Never Used  Substance and Sexual Activity  . Alcohol use: No  . Drug use: No  . Sexual activity: Yes    Birth control/protection: Post-menopausal  Lifestyle  . Physical activity:    Days per week: Not on file    Minutes per session: Not on file  . Stress: Not on file  Relationships  . Social connections:    Talks on phone: Not on file    Gets together: Not on file    Attends religious service: Not on file    Active member of club or organization: Not on file    Attends meetings of clubs or organizations: Not on file    Relationship status: Not on file  . Intimate partner violence:    Fear of current or ex partner: Not on file    Emotionally abused: Not on file    Physically abused: Not on file    Forced sexual activity: Not on file  Other Topics Concern  . Not on file  Social History Narrative  . Not on file      Review of Systems  Constitutional: Negative for activity change, chills, fatigue and unexpected weight change.  HENT: Negative for congestion, postnasal drip, rhinorrhea, sneezing and sore throat.   Eyes: Negative.  Negative for redness.  Respiratory: Negative for cough, chest tightness, shortness of breath and wheezing.   Cardiovascular: Negative for chest pain and palpitations.  Gastrointestinal: Negative for abdominal pain, constipation, diarrhea, nausea and vomiting.  Endocrine: Negative for cold intolerance, heat intolerance, polydipsia, polyphagia and  polyuria.  Genitourinary: Negative for dysuria and frequency.  Musculoskeletal: Negative for arthralgias, back pain, joint swelling and neck pain.  Skin: Negative for rash.  Allergic/Immunologic: Positive for environmental allergies.  Neurological: Negative for tremors, numbness and headaches.  Hematological: Negative for adenopathy. Does not bruise/bleed easily.  Psychiatric/Behavioral: Negative for behavioral problems (Depression), dysphoric mood, sleep disturbance and suicidal ideas. The patient is not nervous/anxious.     Today's Vitals   07/12/17 1144  BP: 130/80  Pulse: 78  Resp: 16  SpO2: 98%  Weight: 147 lb (66.7 kg)  Height: 5' (1.524 m)   Physical Exam  Constitutional: She is oriented to person, place, and time. She appears well-developed and well-nourished. No distress.  HENT:  Head: Normocephalic and atraumatic.  Mouth/Throat: Oropharynx is clear and moist. No  oropharyngeal exudate.  Eyes: Pupils are equal, round, and reactive to light. EOM are normal.  Neck: Normal range of motion. Neck supple. No JVD present. Carotid bruit is not present. No tracheal deviation present. No thyromegaly present.  Cardiovascular: Normal rate, regular rhythm, normal heart sounds and intact distal pulses. Exam reveals no gallop and no friction rub.  No murmur heard. Pulmonary/Chest: Effort normal and breath sounds normal. No respiratory distress. She has no wheezes. She has no rales. She exhibits no tenderness.  Abdominal: Soft. Bowel sounds are normal. There is no tenderness.  Musculoskeletal: Normal range of motion.  Lymphadenopathy:    She has no cervical adenopathy.  Neurological: She is alert and oriented to person, place, and time. No cranial nerve deficit.  Skin: Skin is warm and dry. She is not diaphoretic.  Psychiatric: She has a normal mood and affect. Her behavior is normal. Judgment and thought content normal.  Nursing note and vitals reviewed.  Assessment/Plan:  1.  Essential hypertension Stable. Continue all bp medication as prescribed. Check routine, fasting labs prior to next visit.  - amLODipine (NORVASC) 2.5 MG tablet; Take 1 tablet (2.5 mg total) by mouth 2 (two) times daily.  Dispense: 60 tablet; Refill: 3 - CBC with Differential/Platelet - Comprehensive metabolic panel - T4, free - TSH  2. Impaired fasting glucose Check labs with HgbA1c - CBC with Differential/Platelet - Comprehensive metabolic panel - TSH - HgB A1c  3. Mixed hyperlipidemia Fasting lipid panel to be checked. Adjust atorvastatin as indicated.  - Lipid panel  4. Vitamin D deficiency - Vitamin D 1,25 dihydroxy  5. Allergic rhinitis due to pollen, unspecified seasonality conitnue allergy medication as prescribed    General Counseling: britnee mcdevitt understanding of the findings of todays visit and agrees with plan of treatment. I have discussed any further diagnostic evaluation that may be needed or ordered today. We also reviewed her medications today. she has been encouraged to call the office with any questions or concerns that should arise related to todays visit.  Hypertension Counseling:   The following hypertensive lifestyle modification were recommended and discussed:  1. Limiting alcohol intake to less than 1 oz/day of ethanol:(24 oz of beer or 8 oz of wine or 2 oz of 100-proof whiskey). 2. Take baby ASA 81 mg daily. 3. Importance of regular aerobic exercise and losing weight. 4. Reduce dietary saturated fat and cholesterol intake for overall cardiovascular health. 5. Maintaining adequate dietary potassium, calcium, and magnesium intake. 6. Regular monitoring of the blood pressure. 7. Reduce sodium intake to less than 100 mmol/day (less than 2.3 gm of sodium or less than 6 gm of sodium choride)   This patient was seen by Leretha Pol, FNP- C in Collaboration with Dr Lavera Guise as a part of collaborative care agreement    Orders Placed This Encounter   Procedures  . CBC with Differential/Platelet  . Comprehensive metabolic panel  . T4, free  . TSH  . Lipid panel  . Vitamin D 1,25 dihydroxy  . HgB A1c    Meds ordered this encounter  Medications  . amLODipine (NORVASC) 2.5 MG tablet    Sig: Take 1 tablet (2.5 mg total) by mouth 2 (two) times daily.    Dispense:  60 tablet    Refill:  3    Please note patient should be taking this twice daily.    Order Specific Question:   Supervising Provider    Answer:   Lavera Guise [[id number]]  Time spent: 11 Minutes          Dr Lavera Guise Internal medicine

## 2017-07-18 ENCOUNTER — Other Ambulatory Visit: Payer: Self-pay | Admitting: Internal Medicine

## 2017-08-05 DIAGNOSIS — E559 Vitamin D deficiency, unspecified: Secondary | ICD-10-CM | POA: Insufficient documentation

## 2017-08-05 DIAGNOSIS — R7301 Impaired fasting glucose: Secondary | ICD-10-CM | POA: Insufficient documentation

## 2017-08-05 DIAGNOSIS — I10 Essential (primary) hypertension: Secondary | ICD-10-CM | POA: Insufficient documentation

## 2017-08-05 DIAGNOSIS — E782 Mixed hyperlipidemia: Secondary | ICD-10-CM | POA: Insufficient documentation

## 2017-08-19 ENCOUNTER — Other Ambulatory Visit: Payer: Self-pay | Admitting: Internal Medicine

## 2017-08-19 DIAGNOSIS — D0512 Intraductal carcinoma in situ of left breast: Secondary | ICD-10-CM

## 2017-08-20 ENCOUNTER — Other Ambulatory Visit: Payer: Self-pay | Admitting: Internal Medicine

## 2017-08-21 ENCOUNTER — Other Ambulatory Visit: Payer: Self-pay

## 2017-08-21 MED ORDER — PANTOPRAZOLE SODIUM 40 MG PO TBEC: 40.0000 mg | DELAYED_RELEASE_TABLET | Freq: Every day | ORAL | 5 refills | Status: DC

## 2017-09-17 ENCOUNTER — Other Ambulatory Visit: Payer: Self-pay

## 2017-09-17 ENCOUNTER — Other Ambulatory Visit: Payer: Self-pay | Admitting: Nurse Practitioner

## 2017-09-17 DIAGNOSIS — D0512 Intraductal carcinoma in situ of left breast: Secondary | ICD-10-CM

## 2017-09-17 MED ORDER — CARVEDILOL 25 MG PO TABS: 25.0000 mg | ORAL_TABLET | Freq: Two times a day (BID) | ORAL | 3 refills | Status: DC

## 2017-09-17 MED ORDER — HYDROCHLOROTHIAZIDE 25 MG PO TABS
25.0000 mg | ORAL_TABLET | Freq: Every morning | ORAL | 5 refills | Status: DC
Start: 2017-09-17 — End: 2018-03-07

## 2017-09-21 ENCOUNTER — Other Ambulatory Visit: Payer: Self-pay | Admitting: Internal Medicine

## 2017-09-21 MED ORDER — TELMISARTAN 20 MG PO TABS: 20.0000 mg | ORAL_TABLET | Freq: Every day | ORAL | 0 refills | Status: DC

## 2017-09-28 ENCOUNTER — Other Ambulatory Visit: Payer: Self-pay | Admitting: Nurse Practitioner

## 2017-09-28 LAB — LIPID PANEL
CHOL/HDL RATIO: 3.2 ratio (ref 0.0–4.4)
CHOLESTEROL TOTAL: 198 mg/dL (ref 100–199)
HDL: 62 mg/dL (ref >39)
LDL CALC: 120 mg/dL — AB (ref 0–99)
TRIGLYCERIDES: 82 mg/dL (ref 0–149)
VLDL Cholesterol Cal: 16 mg/dL (ref 5–40)

## 2017-09-28 LAB — VITAMIN D 1,25 DIHYDROXY
Vitamin D 1, 25 (OH)2 Total: 36 pg/mL
Vitamin D3 1, 25 (OH)2: 32 pg/mL

## 2017-09-28 LAB — COMPREHENSIVE METABOLIC PANEL
ALBUMIN: 4.2 g/dL (ref 3.6–4.8)
ALK PHOS: 88 IU/L (ref 39–117)
ALT: 11 IU/L (ref 0–32)
AST: 20 IU/L (ref 0–40)
Albumin/Globulin Ratio: 1.4 (ref 1.2–2.2)
BILIRUBIN TOTAL: 0.6 mg/dL (ref 0.0–1.2)
BUN / CREAT RATIO: 18 (ref 12–28)
BUN: 12 mg/dL (ref 8–27)
CHLORIDE: 100 mmol/L (ref 96–106)
CO2: 27 mmol/L (ref 20–29)
Calcium: 9.4 mg/dL (ref 8.7–10.3)
Creatinine, Ser: 0.66 mg/dL (ref 0.57–1.00)
GFR calc Af Amer: 109 mL/min/{1.73_m2} (ref >59)
GFR calc non Af Amer: 94 mL/min/{1.73_m2} (ref >59)
GLUCOSE: 94 mg/dL (ref 65–99)
Globulin, Total: 2.9 g/dL (ref 1.5–4.5)
Potassium: 3.9 mmol/L (ref 3.5–5.2)
Sodium: 139 mmol/L (ref 134–144)
Total Protein: 7.1 g/dL (ref 6.0–8.5)

## 2017-09-28 LAB — CBC WITH DIFFERENTIAL/PLATELET
BASOS ABS: 0 10*3/uL (ref 0.0–0.2)
Basos: 1 %
EOS (ABSOLUTE): 0.2 10*3/uL (ref 0.0–0.4)
Eos: 3 %
HEMOGLOBIN: 12.9 g/dL (ref 11.1–15.9)
Hematocrit: 41.4 % (ref 34.0–46.6)
IMMATURE GRANS (ABS): 0 10*3/uL (ref 0.0–0.1)
Immature Granulocytes: 0 %
LYMPHS ABS: 1.9 10*3/uL (ref 0.7–3.1)
LYMPHS: 42 %
MCH: 25.9 pg — AB (ref 26.6–33.0)
MCHC: 31.2 g/dL — AB (ref 31.5–35.7)
MCV: 83 fL (ref 79–97)
MONOCYTES: 10 %
Monocytes Absolute: 0.5 10*3/uL (ref 0.1–0.9)
Neutrophils Absolute: 1.9 10*3/uL (ref 1.4–7.0)
Neutrophils: 44 %
Platelets: 238 10*3/uL (ref 150–450)
RBC: 4.98 x10E6/uL (ref 3.77–5.28)
RDW: 15 % (ref 12.3–15.4)
WBC: 4.4 10*3/uL (ref 3.4–10.8)

## 2017-09-28 LAB — T4, FREE: FREE T4: 1.44 ng/dL (ref 0.82–1.77)

## 2017-09-28 LAB — TSH: TSH: 1.76 u[IU]/mL (ref 0.450–4.500)

## 2017-09-28 LAB — HEMOGLOBIN A1C
Est. average glucose Bld gHb Est-mCnc: 123 mg/dL
HEMOGLOBIN A1C: 5.9 % — AB (ref 4.8–5.6)

## 2017-09-28 MED ORDER — TELMISARTAN 20 MG PO TABS: 20.0000 mg | ORAL_TABLET | Freq: Every day | ORAL | 0 refills | Status: DC

## 2017-10-24 ENCOUNTER — Encounter: Payer: Self-pay | Admitting: General Surgery

## 2017-10-24 ENCOUNTER — Ambulatory Visit: Payer: BLUE CROSS/BLUE SHIELD | Admitting: General Surgery

## 2017-10-24 VITALS — BP 124/68 | HR 81 | Resp 12 | Ht 60.0 in | Wt 153.0 lb

## 2017-10-24 DIAGNOSIS — K439 Ventral hernia without obstruction or gangrene: Secondary | ICD-10-CM

## 2017-10-24 DIAGNOSIS — Z170 Estrogen receptor positive status [ER+]: Secondary | ICD-10-CM

## 2017-10-24 DIAGNOSIS — C50412 Malignant neoplasm of upper-outer quadrant of left female breast: Secondary | ICD-10-CM

## 2017-10-24 NOTE — Progress Notes (Signed)
Patient ID: Stacie Ellis, female   DOB: January 11, 1956, 62 y.o.   MRN: [id number]  Chief Complaint  Patient presents with  . Breast Problem    HPI Stacie Ellis is a 62 y.o. female.  here for evaluation of a new left breast lump that she noticed 3 days ago doing her self breast exam. She states it is at the scar but has not noticed this distinct knot before.  HPI  Past Medical History:  Diagnosis Date  . Asthma   . Breast cancer ([place]) 02/2010  . GERD (gastroesophageal reflux disease) 2008  . Hypertension   . Malignant neoplasm of upper-outer quadrant of female breast ([place]) 2011   Intermediate grade DCIS, ER: 50%; PR 10%. left breast lumpectomy, wide excision and a repeat wide excision on 03/10/2010 for multiple positive margins on original resection  . Malignant neoplasm of upper-outer quadrant of female breast [facility]) January 2012:    Re-excision to negative margins.   . Obesity, unspecified   . Postmenopausal bleeding 2013  . S/P radiation therapy 2012   whole breast,   . Special screening for malignant neoplasms, colon     Past Surgical History:  Procedure Laterality Date  . APPENDECTOMY  2008  . BREAST BIOPSY Left 2011   pt states had stereo done on mobile unit at Dr. Dwyane Luo office. DCIS  . BREAST LUMPECTOMY Left 02/2010   lumpectomy with re excision 03/2010 of left breast for cancer and rad tx  . BREAST SURGERY Left 02/17/2010;03/10/2010   lumpectomy and repeat wide excision  . CESAREAN SECTION    . COLON SURGERY Right 06/13/2006   Hand-assisted right hemicolectomy. 5.8 cm villous adenoma without atypia  . COLONOSCOPY  2015   Normal exam.  . ESOPHAGOGASTRODUODENOSCOPY (EGD) WITH PROPOFOL N/A 06/21/2015   Procedure: ESOPHAGOGASTRODUODENOSCOPY (EGD) WITH PROPOFOL;  Surgeon: Hulen Luster, MD;  Location: [facility] ENDOSCOPY;  Service: Gastroenterology;  Laterality: N/A;    Family History  Problem Relation Age of Onset  . Cancer Father        colon  . Cancer Other    unknown family member with breast cancer  . Colon polyps Other        unknown family member with colon polyps    Social History Social History   Tobacco Use  . Smoking status: Never Smoker  . Smokeless tobacco: Never Used  Substance Use Topics  . Alcohol use: No  . Drug use: No    Allergies  Allergen Reactions  . Levaquin [Levofloxacin] Other (See Comments)    Severe tightness in left leg  . Penicillins Rash    Current Outpatient Medications  Medication Sig Dispense Refill  . albuterol (PROVENTIL HFA;VENTOLIN HFA) 108 (90 Base) MCG/ACT inhaler Inhale 2 puffs into the lungs every 6 (six) hours as needed for wheezing or shortness of breath.    Marland Kitchen amLODipine (NORVASC) 2.5 MG tablet Take 1 tablet (2.5 mg total) by mouth 2 (two) times daily. 60 tablet 3  . aspirin 81 MG tablet Take 81 mg by mouth daily.    Marland Kitchen atorvastatin (LIPITOR) 10 MG tablet Take 10 mg by mouth daily.    . budesonide-formoterol (SYMBICORT) 80-4.5 MCG/ACT inhaler Inhale 2 puffs into the lungs 2 (two) times daily.    . carvedilol (COREG) 25 MG tablet Take 1 tablet (25 mg total) by mouth 2 (two) times daily. 60 tablet 3  . hydrochlorothiazide (HYDRODIURIL) 25 MG tablet Take 1 tablet (25 mg total) by mouth every morning. 30 tablet 5  .  loratadine (CLARITIN) 10 MG tablet Take 10 mg by mouth daily.    . montelukast (SINGULAIR) 10 MG tablet Take 10 mg by mouth at bedtime.    Marland Kitchen nystatin cream (MYCOSTATIN) Apply 1 application topically 2 (two) times daily. 30 g 1  . pantoprazole (PROTONIX) 40 MG tablet Take 1 tablet (40 mg total) by mouth daily. 30 tablet 5  . telmisartan (MICARDIS) 20 MG tablet Take 1 tablet (20 mg total) by mouth daily. 30 tablet 0   No current facility-administered medications for this visit.     Review of Systems Review of Systems  Constitutional: Negative.   Respiratory: Negative.   Cardiovascular: Negative.     Blood pressure 124/68, pulse 81, resp. rate 12, height 5' (1.524 m), weight 153 lb  (69.4 kg), SpO2 96 %.  Physical Exam Physical Exam  Constitutional: She is oriented to person, place, and time. She appears well-developed and well-nourished.  HENT:  Mouth/Throat: Oropharynx is clear and moist.  Eyes: Conjunctivae are normal. No scleral icterus.  Neck: Neck supple.  Cardiovascular: Normal rate, regular rhythm and normal heart sounds.  Pulmonary/Chest: Effort normal and breath sounds normal. Right breast exhibits no inverted nipple, no mass, no nipple discharge, no skin change and no tenderness. Left breast exhibits no inverted nipple, no mass, no nipple discharge, no skin change and no tenderness.    Abdominal: Soft. Normal appearance. A hernia is present. Hernia confirmed positive in the ventral area.  Lymphadenopathy:    She has no cervical adenopathy.    She has no axillary adenopathy.  Neurological: She is alert and oriented to person, place, and time.  Skin: Skin is warm and dry.  Psychiatric: Her behavior is normal.    Data Reviewed Screening bilateral mammograms of May 23, 2017 were reviewed once again.  Area of stable calcification in the field of radiation treatment associated clinically with the previously noted mass in the midportion of her wound.  Assessment    Stable breast exam.  No evidence of recurrent disease.    Plan    The patient is aware to call back for any questions or new concerns. Follow up as scheduled.  Spring 2020.   HPI, Physical Exam, Assessment and Plan have been scribed under the direction and in the presence of Robert Bellow, MD. Karie Fetch, RN I have completed the exam and reviewed the above documentation for accuracy and completeness.  I agree with the above.  Haematologist has been used and any errors in dictation or transcription are unintentional.  Hervey Ard, M.D., F.A.C.S.  Stacie Ellis 10/26/2017, 6:29 AM

## 2017-10-24 NOTE — Patient Instructions (Addendum)
The patient is aware to call back for any questions or new concerns.  

## 2017-11-07 ENCOUNTER — Other Ambulatory Visit: Payer: Self-pay

## 2017-11-07 MED ORDER — TELMISARTAN 20 MG PO TABS: 20.0000 mg | ORAL_TABLET | Freq: Every day | ORAL | 5 refills | Status: DC

## 2017-11-20 ENCOUNTER — Encounter: Payer: Self-pay | Admitting: Nurse Practitioner

## 2017-11-20 NOTE — Progress Notes (Signed)
SCANNED IN THYROID ULTRASOUND RESULTS SENT BY [facility] NUCLEAR MED DONE ON 09/13/17

## 2018-01-06 ENCOUNTER — Other Ambulatory Visit: Payer: Self-pay | Admitting: Nurse Practitioner

## 2018-01-06 DIAGNOSIS — I10 Essential (primary) hypertension: Secondary | ICD-10-CM

## 2018-01-06 DIAGNOSIS — D0512 Intraductal carcinoma in situ of left breast: Secondary | ICD-10-CM

## 2018-01-14 ENCOUNTER — Encounter: Payer: Self-pay | Admitting: Nurse Practitioner

## 2018-01-14 ENCOUNTER — Ambulatory Visit: Payer: BLUE CROSS/BLUE SHIELD | Admitting: Nurse Practitioner

## 2018-01-14 VITALS — BP 136/73 | HR 77 | Resp 16 | Ht 59.0 in | Wt 155.0 lb

## 2018-01-14 DIAGNOSIS — R7303 Prediabetes: Secondary | ICD-10-CM | POA: Diagnosis not present

## 2018-01-14 DIAGNOSIS — E782 Mixed hyperlipidemia: Secondary | ICD-10-CM

## 2018-01-14 DIAGNOSIS — I10 Essential (primary) hypertension: Secondary | ICD-10-CM | POA: Diagnosis not present

## 2018-01-14 LAB — POCT GLYCOSYLATED HEMOGLOBIN (HGB A1C): Hemoglobin A1C: 5.8 % — AB (ref 4.0–5.6)

## 2018-01-14 MED ORDER — ROSUVASTATIN CALCIUM 5 MG PO TABS: 5.0000 mg | ORAL_TABLET | Freq: Every day | ORAL | 5 refills | Status: DC

## 2018-01-14 NOTE — Progress Notes (Signed)
[facility] [place], [place] [address]  Internal MEDICINE  Office Visit Note  Patient Name: Stacie Ellis  [id number]  [id number]  Date of Service: 01/16/2018  Chief Complaint  Patient presents with  . Hypertension  . Asthma  . Gastroesophageal Reflux  . other    pt stopped lipitor because she was having joint pain    The patient is here for routine follow up exam. She states that she has stopped taking her atorvastatin. Was really having a lot of problem with joint and muscle pain. Initially, started taking it every other day. When this did not help, she stopped taking it all together. Joints and muscles both feel better. Her most recent fasting lipid panel was done 09/2017. Did show LDL of 120 and total cholesterol of 198. She states that she had already stopped taking her atorvastatin at this time. She does have a strong family history of CAD, and had been told the atorvastatin was more for prevention than treatment of high cholesterol.  She has been labeled pre-diabetic in the past. Blood sugars generally run on the high side of normal. Prior HgbA1c was 5.9, today, HgbA1c is 5.8.  She otherwise, has no complaints. Her blood pressure is well managed. She feels good, overall.       Current Medication: Outpatient Encounter Medications as of 01/14/2018  Medication Sig  . albuterol (PROVENTIL HFA;VENTOLIN HFA) 108 (90 Base) MCG/ACT inhaler Inhale 2 puffs into the lungs every 6 (six) hours as needed for wheezing or shortness of breath.  Marland Kitchen amLODipine (NORVASC) 2.5 MG tablet TAKE 1 TABLET BY MOUTH TWICE DAILY  . aspirin 81 MG tablet Take 81 mg by mouth daily.  . budesonide-formoterol (SYMBICORT) 80-4.5 MCG/ACT inhaler Inhale 2 puffs into the lungs 2 (two) times daily.  . carvedilol (COREG) 25 MG tablet TAKE 1 TABLET(25 MG) BY MOUTH TWICE DAILY  . hydrochlorothiazide (HYDRODIURIL) 25 MG tablet Take 1 tablet (25 mg total) by mouth every morning.  .  loratadine (CLARITIN) 10 MG tablet Take 10 mg by mouth daily.  . montelukast (SINGULAIR) 10 MG tablet Take 10 mg by mouth at bedtime.  Marland Kitchen nystatin cream (MYCOSTATIN) Apply 1 application topically 2 (two) times daily.  . pantoprazole (PROTONIX) 40 MG tablet Take 1 tablet (40 mg total) by mouth daily.  Marland Kitchen telmisartan (MICARDIS) 20 MG tablet Take 1 tablet (20 mg total) by mouth daily.  . rosuvastatin (CRESTOR) 5 MG tablet Take 1 tablet (5 mg total) by mouth daily.  . [DISCONTINUED] atorvastatin (LIPITOR) 10 MG tablet Take 10 mg by mouth daily.   No facility-administered encounter medications on file as of 01/14/2018.     Surgical History: Past Surgical History:  Procedure Laterality Date  . APPENDECTOMY  2008  . BREAST BIOPSY Left 2011   pt states had stereo done on mobile unit at Dr. Dwyane Luo office. DCIS  . BREAST LUMPECTOMY Left 02/2010   lumpectomy with re excision 03/2010 of left breast for cancer and rad tx  . BREAST SURGERY Left 02/17/2010;03/10/2010   lumpectomy and repeat wide excision  . CESAREAN SECTION    . COLON SURGERY Right 06/13/2006   Hand-assisted right hemicolectomy. 5.8 cm villous adenoma without atypia  . COLONOSCOPY  2015   Normal exam.  . ESOPHAGOGASTRODUODENOSCOPY (EGD) WITH PROPOFOL N/A 06/21/2015   Procedure: ESOPHAGOGASTRODUODENOSCOPY (EGD) WITH PROPOFOL;  Surgeon: Hulen Luster, MD;  Location: [facility] ENDOSCOPY;  Service: Gastroenterology;  Laterality: N/A;    Medical History: Past Medical History:  Diagnosis Date  . Asthma   . Breast cancer ([place]) 02/2010  . GERD (gastroesophageal reflux disease) 2008  . Hypertension   . Malignant neoplasm of upper-outer quadrant of female breast ([place]) 2011   Intermediate grade DCIS, ER: 50%; PR 10%. left breast lumpectomy, wide excision and a repeat wide excision on 03/10/2010 for multiple positive margins on original resection  . Malignant neoplasm of upper-outer quadrant of female breast [facility]) January 2012:    Re-excision to  negative margins.   . Obesity, unspecified   . Postmenopausal bleeding 2013  . S/P radiation therapy 2012   whole breast,   . Special screening for malignant neoplasms, colon     Family History: Family History  Problem Relation Age of Onset  . Cancer Father        colon  . Cancer Other        unknown family member with breast cancer  . Colon polyps Other        unknown family member with colon polyps    Social History   Socioeconomic History  . Marital status: Married    Spouse name: Not on file  . Number of children: Not on file  . Years of education: Not on file  . Highest education level: Not on file  Occupational History  . Not on file  Social Needs  . Financial resource strain: Not on file  . Food insecurity:    Worry: Not on file    Inability: Not on file  . Transportation needs:    Medical: Not on file    Non-medical: Not on file  Tobacco Use  . Smoking status: Never Smoker  . Smokeless tobacco: Never Used  Substance and Sexual Activity  . Alcohol use: No  . Drug use: No  . Sexual activity: Yes    Birth control/protection: Post-menopausal  Lifestyle  . Physical activity:    Days per week: Not on file    Minutes per session: Not on file  . Stress: Not on file  Relationships  . Social connections:    Talks on phone: Not on file    Gets together: Not on file    Attends religious service: Not on file    Active member of club or organization: Not on file    Attends meetings of clubs or organizations: Not on file    Relationship status: Not on file  . Intimate partner violence:    Fear of current or ex partner: Not on file    Emotionally abused: Not on file    Physically abused: Not on file    Forced sexual activity: Not on file  Other Topics Concern  . Not on file  Social History Narrative  . Not on file      Review of Systems  Constitutional: Negative for activity change, chills, fatigue and unexpected weight change.  HENT: Negative for  congestion, postnasal drip, rhinorrhea, sneezing and sore throat.   Eyes: Negative.  Negative for redness.  Respiratory: Negative for cough, chest tightness, shortness of breath and wheezing.   Cardiovascular: Negative for chest pain and palpitations.  Gastrointestinal: Negative for abdominal pain, constipation, diarrhea, nausea and vomiting.  Endocrine: Negative for cold intolerance, heat intolerance, polydipsia, polyphagia and polyuria.  Genitourinary: Negative for dysuria and frequency.  Musculoskeletal: Negative for arthralgias, back pain, joint swelling and neck pain.  Skin: Negative for rash.  Allergic/Immunologic: Positive for environmental allergies.  Neurological: Negative for tremors, numbness and headaches.  Hematological: Negative for adenopathy. Does  not bruise/bleed easily.  Psychiatric/Behavioral: Negative for behavioral problems (Depression), dysphoric mood, sleep disturbance and suicidal ideas. The patient is not nervous/anxious.     Today's Vitals   01/14/18 1147  BP: 136/73  Pulse: 77  Resp: 16  SpO2: 95%  Weight: 155 lb (70.3 kg)  Height: 4\' 11"  (1.499 m)    Physical Exam  Constitutional: She is oriented to person, place, and time. She appears well-developed and well-nourished. No distress.  HENT:  Head: Normocephalic and atraumatic.  Mouth/Throat: No oropharyngeal exudate.  Eyes: Pupils are equal, round, and reactive to light. EOM are normal.  Neck: Normal range of motion. Neck supple. No JVD present. Carotid bruit is not present. No tracheal deviation present. No thyromegaly present.  Cardiovascular: Normal rate, regular rhythm and normal heart sounds. Exam reveals no gallop and no friction rub.  No murmur heard. Pulmonary/Chest: Effort normal and breath sounds normal. No respiratory distress. She has no wheezes. She has no rales. She exhibits no tenderness.  Musculoskeletal: Normal range of motion.  Lymphadenopathy:    She has no cervical adenopathy.   Neurological: She is alert and oriented to person, place, and time. No cranial nerve deficit.  Skin: Skin is warm and dry. She is not diaphoretic.  Psychiatric: She has a normal mood and affect. Her behavior is normal. Judgment and thought content normal.  Nursing note and vitals reviewed.  Assessment/Plan: 1. Essential hypertension Stable. Continue BP medication as prescribed. Refills to be provided as needed.   2. Pre-diabetes - POCT HgB A1C 5.8 today. Continue to monitor and control with diet and exercise.   3. Mixed hyperlipidemia Will do trial of rosuvastatin 5mg  every evening. Consider adding CoQ10 to help prevent joint and muscle pain.  - rosuvastatin (CRESTOR) 5 MG tablet; Take 1 tablet (5 mg total) by mouth daily.  Dispense: 30 tablet; Refill: 5  General Counseling: Stacie Ellis understanding of the findings of todays visit and agrees with plan of treatment. I have discussed any further diagnostic evaluation that may be needed or ordered today. We also reviewed her medications today. she has been encouraged to call the office with any questions or concerns that should arise related to todays visit.  Marland KitchenHypertension Counseling:   The following hypertensive lifestyle modification were recommended and discussed:  1. Limiting alcohol intake to less than 1 oz/day of ethanol:(24 oz of beer or 8 oz of wine or 2 oz of 100-proof whiskey). 2. Take baby ASA 81 mg daily. 3. Importance of regular aerobic exercise and losing weight. 4. Reduce dietary saturated fat and cholesterol intake for overall cardiovascular health. 5. Maintaining adequate dietary potassium, calcium, and magnesium intake. 6. Regular monitoring of the blood pressure. 7. Reduce sodium intake to less than 100 mmol/day (less than 2.3 gm of sodium or less than 6 gm of sodium choride)   This patient was seen by [place] with Dr Lavera Guise as a part of collaborative care agreement  Orders Placed  This Encounter  Procedures  . POCT HgB A1C    Meds ordered this encounter  Medications  . rosuvastatin (CRESTOR) 5 MG tablet    Sig: Take 1 tablet (5 mg total) by mouth daily.    Dispense:  30 tablet    Refill:  5    Order Specific Question:   Supervising Provider    Answer:   Lavera Guise [[id number]]    Time spent: 48 Minutes      Dr Lavera Guise Internal medicine

## 2018-01-16 DIAGNOSIS — R7303 Prediabetes: Secondary | ICD-10-CM | POA: Insufficient documentation

## 2018-02-05 ENCOUNTER — Other Ambulatory Visit: Payer: Self-pay

## 2018-02-05 MED ORDER — PANTOPRAZOLE SODIUM 40 MG PO TBEC: 40.0000 mg | DELAYED_RELEASE_TABLET | Freq: Every day | ORAL | 5 refills | Status: DC

## 2018-03-07 ENCOUNTER — Other Ambulatory Visit: Payer: Self-pay

## 2018-03-07 DIAGNOSIS — D0512 Intraductal carcinoma in situ of left breast: Secondary | ICD-10-CM

## 2018-03-07 MED ORDER — HYDROCHLOROTHIAZIDE 25 MG PO TABS: 25.0000 mg | ORAL_TABLET | Freq: Every morning | ORAL | 5 refills | Status: DC

## 2018-05-07 ENCOUNTER — Other Ambulatory Visit: Payer: Self-pay

## 2018-05-07 DIAGNOSIS — Z1231 Encounter for screening mammogram for malignant neoplasm of breast: Secondary | ICD-10-CM

## 2018-05-16 ENCOUNTER — Telehealth: Payer: Self-pay

## 2018-05-22 NOTE — Telephone Encounter (Signed)
Lab slip ready to be mailed or picked up by patient.

## 2018-05-22 NOTE — Telephone Encounter (Signed)
Pt advised we mailed labslip

## 2018-06-05 ENCOUNTER — Other Ambulatory Visit: Payer: Self-pay

## 2018-06-05 MED ORDER — TELMISARTAN 20 MG PO TABS: 20.0000 mg | ORAL_TABLET | Freq: Every day | ORAL | 5 refills | Status: DC

## 2018-06-11 ENCOUNTER — Ambulatory Visit: Payer: BLUE CROSS/BLUE SHIELD | Admitting: General Surgery

## 2018-07-05 ENCOUNTER — Other Ambulatory Visit: Payer: Self-pay

## 2018-07-05 DIAGNOSIS — I10 Essential (primary) hypertension: Secondary | ICD-10-CM

## 2018-07-05 DIAGNOSIS — D0512 Intraductal carcinoma in situ of left breast: Secondary | ICD-10-CM

## 2018-07-05 MED ORDER — AMLODIPINE BESYLATE 2.5 MG PO TABS: 2.5000 mg | ORAL_TABLET | Freq: Two times a day (BID) | ORAL | 5 refills | Status: DC

## 2018-07-05 MED ORDER — CARVEDILOL 25 MG PO TABS: ORAL_TABLET | ORAL | 5 refills | Status: DC

## 2018-07-09 ENCOUNTER — Other Ambulatory Visit: Payer: Self-pay | Admitting: Nurse Practitioner

## 2018-07-11 LAB — COMPREHENSIVE METABOLIC PANEL
ALT: 12 IU/L (ref 0–32)
AST: 18 IU/L (ref 0–40)
Albumin/Globulin Ratio: 1.5 (ref 1.2–2.2)
Albumin: 4.1 g/dL (ref 3.8–4.8)
Alkaline Phosphatase: 82 IU/L (ref 39–117)
BUN/Creatinine Ratio: 20 (ref 12–28)
BUN: 15 mg/dL (ref 8–27)
Bilirubin Total: 0.7 mg/dL (ref 0.0–1.2)
CO2: 25 mmol/L (ref 20–29)
Calcium: 9.3 mg/dL (ref 8.7–10.3)
Chloride: 101 mmol/L (ref 96–106)
Creatinine, Ser: 0.75 mg/dL (ref 0.57–1.00)
GFR calc Af Amer: 97 mL/min/{1.73_m2} (ref >59)
GFR calc non Af Amer: 85 mL/min/{1.73_m2} (ref >59)
Globulin, Total: 2.8 g/dL (ref 1.5–4.5)
Glucose: 93 mg/dL (ref 65–99)
Potassium: 3.8 mmol/L (ref 3.5–5.2)
Sodium: 140 mmol/L (ref 134–144)
Total Protein: 6.9 g/dL (ref 6.0–8.5)

## 2018-07-11 LAB — LIPID PANEL W/O CHOL/HDL RATIO
Cholesterol, Total: 216 mg/dL — ABNORMAL HIGH (ref 100–199)
HDL: 60 mg/dL (ref >39)
LDL Calculated: 132 mg/dL — ABNORMAL HIGH (ref 0–99)
Triglycerides: 120 mg/dL (ref 0–149)
VLDL Cholesterol Cal: 24 mg/dL (ref 5–40)

## 2018-07-11 LAB — B12 AND FOLATE PANEL
Folate: 11.9 ng/mL (ref >3.0)
Vitamin B-12: 400 pg/mL (ref 232–1245)

## 2018-07-11 LAB — C-REACTIVE PROTEIN: CRP: 5 mg/L (ref 0–10)

## 2018-07-11 LAB — CBC
Hematocrit: 39.1 % (ref 34.0–46.6)
Hemoglobin: 13.1 g/dL (ref 11.1–15.9)
MCH: 27.5 pg (ref 26.6–33.0)
MCHC: 33.5 g/dL (ref 31.5–35.7)
MCV: 82 fL (ref 79–97)
Platelets: 204 10*3/uL (ref 150–450)
RBC: 4.76 x10E6/uL (ref 3.77–5.28)
RDW: 13.6 % (ref 11.7–15.4)
WBC: 4.4 10*3/uL (ref 3.4–10.8)

## 2018-07-11 LAB — IRON AND TIBC
Iron Saturation: 29 % (ref 15–55)
Iron: 122 ug/dL (ref 27–139)
Total Iron Binding Capacity: 418 ug/dL (ref 250–450)
UIBC: 296 ug/dL (ref 118–369)

## 2018-07-11 LAB — MAGNESIUM, RBC: Magnesium RBC: 5.2 mg/dL (ref 4.2–6.8)

## 2018-07-11 LAB — FERRITIN: Ferritin: 20 ng/mL (ref 15–150)

## 2018-07-11 LAB — T4, FREE: Free T4: 1.22 ng/dL (ref 0.82–1.77)

## 2018-07-11 LAB — TSH: TSH: 1.71 u[IU]/mL (ref 0.450–4.500)

## 2018-07-11 LAB — T3: T3, Total: 121 ng/dL (ref 71–180)

## 2018-07-11 LAB — VITAMIN D 25 HYDROXY (VIT D DEFICIENCY, FRACTURES): Vit D, 25-Hydroxy: 77 ng/mL (ref 30.0–100.0)

## 2018-07-15 ENCOUNTER — Encounter: Payer: Self-pay | Admitting: Nurse Practitioner

## 2018-07-24 ENCOUNTER — Other Ambulatory Visit: Payer: Self-pay

## 2018-07-24 ENCOUNTER — Ambulatory Visit
Admission: RE | Admit: 2018-07-24 | Discharge: 2018-07-24 | Disposition: A | Discharge status: Home / Self Care | Payer: BLUE CROSS/BLUE SHIELD | Source: Ambulatory Visit | Attending: General Surgery | Admitting: General Surgery

## 2018-07-24 DIAGNOSIS — Z1231 Encounter for screening mammogram for malignant neoplasm of breast: Secondary | ICD-10-CM | POA: Insufficient documentation

## 2018-07-30 ENCOUNTER — Ambulatory Visit: Payer: BLUE CROSS/BLUE SHIELD | Admitting: General Surgery

## 2018-08-07 ENCOUNTER — Other Ambulatory Visit: Payer: BLUE CROSS/BLUE SHIELD

## 2018-08-07 ENCOUNTER — Telehealth: Payer: Self-pay

## 2018-08-07 ENCOUNTER — Encounter: Payer: Self-pay | Admitting: Nurse Practitioner

## 2018-08-07 ENCOUNTER — Telehealth: Payer: Self-pay | Admitting: *Deleted

## 2018-08-07 DIAGNOSIS — Z20822 Contact with and (suspected) exposure to covid-19: Secondary | ICD-10-CM

## 2018-08-07 NOTE — Telephone Encounter (Signed)
Pt called that her husband was exposure to covid 30 and she want test for covid 19 as per heather send message to [facility] for test for covid

## 2018-08-07 NOTE — Telephone Encounter (Signed)
Pt scheduled for testing at [facility] site. States she is on her way there now to get tested at same time as her husband. Requested by Dr. Clayborn Bigness at [facility] Ph # [phone] Fax [phone]  Pts Ph# [phone]  Testing process reviewed with pt; verbalizes understanding.

## 2018-08-07 NOTE — Telephone Encounter (Signed)
Received call from [facility] that they had routed request for testing for pt. Called to state pt was on her way to site as her husband was being tested"Soon."  Called pt to inform her she needed to be on schedule with order placed. TN asked what time her husbands appt was, did not find on schedule or message with request from PCP Dr. Dwyane Dee. Pt stated she will call Dr. Ronnie Derby practice to clarify. Did not want to be scheduled at this time. States she will CB so she and husband can be scheduled at same time.  Pts CB # [phone]  Order request from [facility],  Dr. Clayborn Bigness Ph# [phone] Fax # [phone]

## 2018-08-09 ENCOUNTER — Telehealth: Payer: Self-pay

## 2018-08-09 LAB — NOVEL CORONAVIRUS, NAA: SARS-CoV-2, NAA: NOT DETECTED

## 2018-08-09 NOTE — Telephone Encounter (Signed)
Pt advised that labs for covid is negative

## 2018-08-14 ENCOUNTER — Other Ambulatory Visit: Payer: Self-pay

## 2018-08-14 DIAGNOSIS — D0512 Intraductal carcinoma in situ of left breast: Secondary | ICD-10-CM

## 2018-08-14 MED ORDER — HYDROCHLOROTHIAZIDE 25 MG PO TABS: 25.0000 mg | ORAL_TABLET | Freq: Every morning | ORAL | 0 refills | Status: DC

## 2018-08-14 MED ORDER — PANTOPRAZOLE SODIUM 40 MG PO TBEC: 40.0000 mg | DELAYED_RELEASE_TABLET | Freq: Every day | ORAL | 1 refills | Status: DC

## 2018-08-27 ENCOUNTER — Ambulatory Visit: Payer: BLUE CROSS/BLUE SHIELD | Admitting: General Surgery

## 2018-09-10 ENCOUNTER — Other Ambulatory Visit: Payer: Self-pay

## 2018-09-10 ENCOUNTER — Ambulatory Visit: Payer: BLUE CROSS/BLUE SHIELD | Admitting: General Surgery

## 2018-09-10 ENCOUNTER — Encounter: Payer: Self-pay | Admitting: General Surgery

## 2018-09-10 VITALS — BP 140/88 | HR 83 | Temp 97.7°F | Resp 16 | Ht 59.0 in | Wt 161.8 lb

## 2018-09-10 DIAGNOSIS — Z853 Personal history of malignant neoplasm of breast: Secondary | ICD-10-CM

## 2018-09-10 NOTE — Progress Notes (Signed)
Patient ID: Stacie Ellis, female   DOB: 10/04/1955, 63 y.o.   MRN: [id number]  Chief Complaint  Patient presents with  . Follow-up    1 year screening mammogram.     HPI Stacie Ellis is a 63 y.o. female.  Here today for 1 year follow up screening mammogram. No complaints. Patient does self breast exams. She recently found out that my practice is no longer in her health care network.   She is due for a follow up colonoscopy this year.    HPI  Past Medical History:  Diagnosis Date  . Asthma   . Breast cancer ([place]) 02/2010  . GERD (gastroesophageal reflux disease) 2008  . Hypertension   . Malignant neoplasm of upper-outer quadrant of female breast ([place]) 2011   Intermediate grade DCIS, ER: 50%; PR 10%. left breast lumpectomy, wide excision and a repeat wide excision on 03/10/2010 for multiple positive margins on original resection  . Malignant neoplasm of upper-outer quadrant of female breast [facility]) January 2012:    Re-excision to negative margins.   . Obesity, unspecified   . Postmenopausal bleeding 2013  . S/P radiation therapy 2012   whole breast,   . Special screening for malignant neoplasms, colon     Past Surgical History:  Procedure Laterality Date  . APPENDECTOMY  2008  . BREAST BIOPSY Left 2011   pt states had stereo done on mobile unit at Dr. Dwyane Luo office. DCIS  . BREAST LUMPECTOMY Left 02/2010   lumpectomy with re excision 03/2010 of left breast for cancer and rad tx  . BREAST SURGERY Left 02/17/2010;03/10/2010   lumpectomy and repeat wide excision  . CESAREAN SECTION    . COLON SURGERY Right 06/13/2006   Hand-assisted right hemicolectomy. 5.8 cm villous adenoma without atypia  . COLONOSCOPY  2015   Normal exam.  . ESOPHAGOGASTRODUODENOSCOPY (EGD) WITH PROPOFOL N/A 06/21/2015   Procedure: ESOPHAGOGASTRODUODENOSCOPY (EGD) WITH PROPOFOL;  Surgeon: Hulen Luster, MD;  Location: [facility] ENDOSCOPY;  Service: Gastroenterology;  Laterality: N/A;    Family History   Problem Relation Age of Onset  . Cancer Father        colon  . Cancer Other        unknown family member with breast cancer  . Colon polyps Other        unknown family member with colon polyps    Social History Social History   Tobacco Use  . Smoking status: Never Smoker  . Smokeless tobacco: Never Used  Substance Use Topics  . Alcohol use: No  . Drug use: No    Allergies  Allergen Reactions  . Levaquin [Levofloxacin] Other (See Comments)    Severe tightness in left leg  . Penicillins Rash    Current Outpatient Medications  Medication Sig Dispense Refill  . albuterol (PROVENTIL HFA;VENTOLIN HFA) 108 (90 Base) MCG/ACT inhaler Inhale 2 puffs into the lungs every 6 (six) hours as needed for wheezing or shortness of breath.    Marland Kitchen amLODipine (NORVASC) 2.5 MG tablet Take 1 tablet (2.5 mg total) by mouth 2 (two) times daily. 60 tablet 5  . aspirin 81 MG tablet Take 81 mg by mouth daily.    . budesonide-formoterol (SYMBICORT) 80-4.5 MCG/ACT inhaler Inhale 2 puffs into the lungs 2 (two) times daily.    . carvedilol (COREG) 25 MG tablet TAKE 1 TABLET(25 MG) BY MOUTH TWICE DAILY 60 tablet 5  . hydrochlorothiazide (HYDRODIURIL) 25 MG tablet Take 1 tablet (25 mg total) by mouth every morning.  30 tablet 0  . loratadine (CLARITIN) 10 MG tablet Take 10 mg by mouth daily.    . montelukast (SINGULAIR) 10 MG tablet Take 10 mg by mouth at bedtime.    Marland Kitchen nystatin cream (MYCOSTATIN) Apply 1 application topically 2 (two) times daily. 30 g 1  . pantoprazole (PROTONIX) 40 MG tablet Take 1 tablet (40 mg total) by mouth daily. 30 tablet 1  . rosuvastatin (CRESTOR) 5 MG tablet Take 1 tablet (5 mg total) by mouth daily. 30 tablet 5  . telmisartan (MICARDIS) 20 MG tablet Take 1 tablet (20 mg total) by mouth daily. 30 tablet 5   No current facility-administered medications for this visit.     Review of Systems Review of Systems  Constitutional: Negative.   Respiratory: Negative.   Cardiovascular:  Negative.     Blood pressure 140/88, pulse 83, temperature 97.7 F (36.5 C), temperature source Temporal, resp. rate 16, height 4\' 11"  (1.499 m), weight 161 lb 12.8 oz (73.4 kg), SpO2 95 %.  Physical Exam Physical Exam Exam conducted with a chaperone present.  Constitutional:      Appearance: She is well-developed.  Eyes:     Conjunctiva/sclera: Conjunctivae normal.  Neck:     Musculoskeletal: Normal range of motion.  Cardiovascular:     Rate and Rhythm: Normal rate and regular rhythm.     Heart sounds: Normal heart sounds.  Pulmonary:     Effort: Pulmonary effort is normal.     Breath sounds: Normal breath sounds.  Chest:     Breasts:        Right: Normal.     Lymphadenopathy:     Upper Body:     Right upper body: No supraclavicular or axillary adenopathy.     Left upper body: No supraclavicular or axillary adenopathy.  Skin:    General: Skin is warm and dry.  Neurological:     Mental Status: She is alert and oriented to person, place, and time.     Data Reviewed The screening mammograms of Jul 24, 2018 were independently reviewed.  Chronic post surgical changes are noted in the left breast.  BIRAD 2.    Assessment No evidence of recurrent cancer.   Plan Please follow up with your Mammograms with your Primary care physician.   She will contact her PCP for a referral to GI in her new health network for a colonoscopy this year.      HPI, Physical Exam, Assessment and Plan have been scribed under the direction and in the presence of Robert Bellow, MD. Stacie Ellis, CMA  I have completed the exam and reviewed the above documentation for accuracy and completeness.  I agree with the above.  Haematologist has been used and any errors in dictation or transcription are unintentional.  Hervey Ard, M.D., F.A.C.S.  Stacie Ellis Stacie Ellis 09/11/2018, 7:39 AM

## 2018-09-10 NOTE — Patient Instructions (Signed)
Please follow up with your Mammograms with your Primary care physician.

## 2018-09-17 ENCOUNTER — Other Ambulatory Visit: Payer: Self-pay

## 2018-09-17 ENCOUNTER — Encounter: Payer: Self-pay | Admitting: Nurse Practitioner

## 2018-09-17 ENCOUNTER — Ambulatory Visit (INDEPENDENT_AMBULATORY_CARE_PROVIDER_SITE_OTHER): Payer: BLUE CROSS/BLUE SHIELD | Admitting: Nurse Practitioner

## 2018-09-17 VITALS — Ht 59.0 in | Wt 158.0 lb

## 2018-09-17 DIAGNOSIS — E782 Mixed hyperlipidemia: Secondary | ICD-10-CM

## 2018-09-17 DIAGNOSIS — J301 Allergic rhinitis due to pollen: Secondary | ICD-10-CM | POA: Diagnosis not present

## 2018-09-17 DIAGNOSIS — Z0001 Encounter for general adult medical examination with abnormal findings: Secondary | ICD-10-CM

## 2018-09-17 DIAGNOSIS — I10 Essential (primary) hypertension: Secondary | ICD-10-CM

## 2018-09-17 NOTE — Progress Notes (Signed)
[facility] [place], [place] [address]  Internal MEDICINE  Telephone Visit  Patient Name: Stacie Ellis  [id number]  [id number]  Date of Service: 09/25/2018  I connected with the patient at 4:35pm by webcam and verified the patients identity using two identifiers.   I discussed the limitations, risks, security and privacy concerns of performing an evaluation and management service by webcam and the availability of in person appointments. I also discussed with the patient that there may be a patient responsible charge related to the service.  The patient expressed understanding and agrees to proceed.    Chief Complaint  Patient presents with  . Telephone Screen    VIDEO VISIT [phone]  . Telephone Assessment  . Annual Exam  . Hypertension  . Gastroesophageal Reflux    The patient has been contacted via webcam for follow up visit due to concerns for spread of novel coronavirus. She states that she is feeling well overall. She and her husband both contracted COVID 44. They have completed treatment and are continuing to recover well. She has no concerns or complaints to discuss. She had routine, fasting labs done in May, 2020. She had very mild elevation of LDL and total cholesterol. Other labs were normal. Her had screening mammogram 07/24/2018 which was negative       Current Medication: Outpatient Encounter Medications as of 09/17/2018  Medication Sig  . albuterol (PROVENTIL HFA;VENTOLIN HFA) 108 (90 Base) MCG/ACT inhaler Inhale 2 puffs into the lungs every 6 (six) hours as needed for wheezing or shortness of breath.  Marland Kitchen amLODipine (NORVASC) 2.5 MG tablet Take 1 tablet (2.5 mg total) by mouth 2 (two) times daily.  Marland Kitchen aspirin 81 MG tablet Take 81 mg by mouth daily.  . budesonide-formoterol (SYMBICORT) 80-4.5 MCG/ACT inhaler Inhale 2 puffs into the lungs 2 (two) times daily.  . carvedilol (COREG) 25 MG tablet TAKE 1 TABLET(25 MG) BY MOUTH TWICE DAILY  .  hydrochlorothiazide (HYDRODIURIL) 25 MG tablet Take 1 tablet (25 mg total) by mouth every morning.  . loratadine (CLARITIN) 10 MG tablet Take 10 mg by mouth daily.  . montelukast (SINGULAIR) 10 MG tablet Take 10 mg by mouth at bedtime.  Marland Kitchen nystatin cream (MYCOSTATIN) Apply 1 application topically 2 (two) times daily.  . pantoprazole (PROTONIX) 40 MG tablet Take 1 tablet (40 mg total) by mouth daily.  Marland Kitchen telmisartan (MICARDIS) 20 MG tablet Take 1 tablet (20 mg total) by mouth daily.  . [DISCONTINUED] rosuvastatin (CRESTOR) 5 MG tablet Take 1 tablet (5 mg total) by mouth daily. (Patient not taking: Reported on 09/17/2018)   No facility-administered encounter medications on file as of 09/17/2018.     Surgical History: Past Surgical History:  Procedure Laterality Date  . APPENDECTOMY  2008  . BREAST BIOPSY Left 2011   pt states had stereo done on mobile unit at Dr. Dwyane Luo office. DCIS  . BREAST LUMPECTOMY Left 02/2010   lumpectomy with re excision 03/2010 of left breast for cancer and rad tx  . BREAST SURGERY Left 02/17/2010;03/10/2010   lumpectomy and repeat wide excision  . CESAREAN SECTION    . COLON SURGERY Right 06/13/2006   Hand-assisted right hemicolectomy. 5.8 cm villous adenoma without atypia  . COLONOSCOPY  2015   Normal exam.  . ESOPHAGOGASTRODUODENOSCOPY (EGD) WITH PROPOFOL N/A 06/21/2015   Procedure: ESOPHAGOGASTRODUODENOSCOPY (EGD) WITH PROPOFOL;  Surgeon: Hulen Luster, MD;  Location: [facility] ENDOSCOPY;  Service: Gastroenterology;  Laterality: N/A;    Medical History: Past Medical  History:  Diagnosis Date  . Asthma   . Breast cancer ([place]) 02/2010  . GERD (gastroesophageal reflux disease) 2008  . Hypertension   . Malignant neoplasm of upper-outer quadrant of female breast ([place]) 2011   Intermediate grade DCIS, ER: 50%; PR 10%. left breast lumpectomy, wide excision and a repeat wide excision on 03/10/2010 for multiple positive margins on original resection  . Malignant neoplasm of  upper-outer quadrant of female breast [facility]) January 2012:    Re-excision to negative margins.   . Obesity, unspecified   . Postmenopausal bleeding 2013  . S/P radiation therapy 2012   whole breast,   . Special screening for malignant neoplasms, colon     Family History: Family History  Problem Relation Age of Onset  . Cancer Father        colon  . Cancer Other        unknown family member with breast cancer  . Colon polyps Other        unknown family member with colon polyps    Social History   Socioeconomic History  . Marital status: Married    Spouse name: Not on file  . Number of children: Not on file  . Years of education: Not on file  . Highest education level: Not on file  Occupational History  . Not on file  Social Needs  . Financial resource strain: Not on file  . Food insecurity    Worry: Not on file    Inability: Not on file  . Transportation needs    Medical: Not on file    Non-medical: Not on file  Tobacco Use  . Smoking status: Never Smoker  . Smokeless tobacco: Never Used  Substance and Sexual Activity  . Alcohol use: No  . Drug use: No  . Sexual activity: Yes    Birth control/protection: Post-menopausal  Lifestyle  . Physical activity    Days per week: Not on file    Minutes per session: Not on file  . Stress: Not on file  Relationships  . Social Herbalist on phone: Not on file    Gets together: Not on file    Attends religious service: Not on file    Active member of club or organization: Not on file    Attends meetings of clubs or organizations: Not on file    Relationship status: Not on file  . Intimate partner violence    Fear of current or ex partner: Not on file    Emotionally abused: Not on file    Physically abused: Not on file    Forced sexual activity: Not on file  Other Topics Concern  . Not on file  Social History Narrative  . Not on file      Review of Systems  Constitutional: Negative for activity change,  chills, fatigue and unexpected weight change.  HENT: Negative for congestion, postnasal drip, rhinorrhea, sneezing and sore throat.   Respiratory: Negative for cough, chest tightness, shortness of breath and wheezing.   Cardiovascular: Negative for chest pain and palpitations.  Gastrointestinal: Negative for abdominal pain, constipation, diarrhea, nausea and vomiting.  Endocrine: Negative for cold intolerance, heat intolerance, polydipsia and polyuria.  Genitourinary: Negative for dysuria and frequency.  Musculoskeletal: Negative for arthralgias, back pain, joint swelling and neck pain.  Skin: Negative for rash.  Allergic/Immunologic: Positive for environmental allergies.  Neurological: Negative for tremors, numbness and headaches.  Hematological: Negative for adenopathy. Does not bruise/bleed easily.  Psychiatric/Behavioral: Negative  for behavioral problems (Depression), dysphoric mood, sleep disturbance and suicidal ideas. The patient is not nervous/anxious.     Today's Vitals   09/17/18 1504  Weight: 158 lb (71.7 kg)  Height: 4\' 11"  (1.499 m)   Body mass index is 31.91 kg/m.  Observation/Objective:   The patient is alert and oriented. She is pleasant and answers all questions appropriately. Breathing is non-labored. She is in no acute distress at this time.    Assessment/Plan: 1. Encounter for general adult medical examination with abnormal findings Annual health maintenance exam today.  2. Essential hypertension Historically stable. Continue bp medication as prescribed   3. Mixed hyperlipidemia Doing well. Continue crestor as prescribed   4. Allergic rhinitis due to pollen, unspecified seasonality Continue singulair and claritin as prescribed   General Counseling: dinora hemm understanding of the findings of today's phone visit and agrees with plan of treatment. I have discussed any further diagnostic evaluation that may be needed or ordered today. We also reviewed  her medications today. she has been encouraged to call the office with any questions or concerns that should arise related to todays visit.  Hypertension Counseling:   The following hypertensive lifestyle modification were recommended and discussed:  1. Limiting alcohol intake to less than 1 oz/day of ethanol:(24 oz of beer or 8 oz of wine or 2 oz of 100-proof whiskey). 2. Take baby ASA 81 mg daily. 3. Importance of regular aerobic exercise and losing weight. 4. Reduce dietary saturated fat and cholesterol intake for overall cardiovascular health. 5. Maintaining adequate dietary potassium, calcium, and magnesium intake. 6. Regular monitoring of the blood pressure. 7. Reduce sodium intake to less than 100 mmol/day (less than 2.3 gm of sodium or less than 6 gm of sodium choride)   This patient was seen by [place] with Dr Lavera Guise as a part of collaborative care agreement   Time spent: 74 Minutes    Dr Lavera Guise Internal medicine

## 2018-10-03 ENCOUNTER — Other Ambulatory Visit: Payer: Self-pay

## 2018-10-03 DIAGNOSIS — D0512 Intraductal carcinoma in situ of left breast: Secondary | ICD-10-CM

## 2018-10-03 MED ORDER — HYDROCHLOROTHIAZIDE 25 MG PO TABS: 25.0000 mg | ORAL_TABLET | Freq: Every morning | ORAL | 5 refills | Status: DC

## 2018-10-03 MED ORDER — PANTOPRAZOLE SODIUM 40 MG PO TBEC: 40.0000 mg | DELAYED_RELEASE_TABLET | Freq: Every day | ORAL | 1 refills | Status: DC

## 2018-12-02 ENCOUNTER — Other Ambulatory Visit: Payer: Self-pay

## 2018-12-02 MED ORDER — PANTOPRAZOLE SODIUM 40 MG PO TBEC: 40.0000 mg | DELAYED_RELEASE_TABLET | Freq: Every day | ORAL | 5 refills | Status: DC

## 2018-12-02 MED ORDER — TELMISARTAN 20 MG PO TABS: 20.0000 mg | ORAL_TABLET | Freq: Every day | ORAL | 5 refills | Status: DC

## 2019-01-01 ENCOUNTER — Other Ambulatory Visit: Payer: Self-pay | Admitting: Nurse Practitioner

## 2019-01-01 DIAGNOSIS — D0512 Intraductal carcinoma in situ of left breast: Secondary | ICD-10-CM

## 2019-01-01 DIAGNOSIS — I10 Essential (primary) hypertension: Secondary | ICD-10-CM

## 2019-01-01 MED ORDER — CARVEDILOL 25 MG PO TABS: ORAL_TABLET | ORAL | 2 refills | Status: DC

## 2019-01-01 MED ORDER — AMLODIPINE BESYLATE 2.5 MG PO TABS: 2.5000 mg | ORAL_TABLET | Freq: Two times a day (BID) | ORAL | 2 refills | Status: DC

## 2019-03-13 ENCOUNTER — Ambulatory Visit: Payer: BLUE CROSS/BLUE SHIELD | Admitting: Nurse Practitioner

## 2019-03-19 ENCOUNTER — Telehealth: Payer: Self-pay

## 2019-03-19 NOTE — Telephone Encounter (Signed)
CONFIRMED 03-21-19 OV AS VIRTUAL.

## 2019-03-21 ENCOUNTER — Ambulatory Visit: Payer: PPO | Admitting: Nurse Practitioner

## 2019-04-01 ENCOUNTER — Other Ambulatory Visit: Payer: Self-pay

## 2019-04-01 DIAGNOSIS — I10 Essential (primary) hypertension: Secondary | ICD-10-CM

## 2019-04-01 DIAGNOSIS — D0512 Intraductal carcinoma in situ of left breast: Secondary | ICD-10-CM

## 2019-04-01 MED ORDER — HYDROCHLOROTHIAZIDE 25 MG PO TABS: 25.0000 mg | ORAL_TABLET | Freq: Every morning | ORAL | 5 refills | Status: DC

## 2019-04-01 MED ORDER — AMLODIPINE BESYLATE 2.5 MG PO TABS: 2.5000 mg | ORAL_TABLET | Freq: Two times a day (BID) | ORAL | 2 refills | Status: DC

## 2019-04-01 MED ORDER — CARVEDILOL 25 MG PO TABS: ORAL_TABLET | ORAL | 2 refills | Status: DC

## 2019-04-01 MED ORDER — PANTOPRAZOLE SODIUM 40 MG PO TBEC: 40.0000 mg | DELAYED_RELEASE_TABLET | Freq: Every day | ORAL | 1 refills | Status: DC

## 2019-04-03 ENCOUNTER — Telehealth: Payer: Self-pay

## 2019-04-03 NOTE — Telephone Encounter (Signed)
Called lmom informing patient of virtual visit. klh 

## 2019-04-07 ENCOUNTER — Ambulatory Visit (INDEPENDENT_AMBULATORY_CARE_PROVIDER_SITE_OTHER): Payer: PPO | Admitting: Nurse Practitioner

## 2019-04-07 ENCOUNTER — Encounter: Payer: Self-pay | Admitting: Nurse Practitioner

## 2019-04-07 VITALS — BP 135/86 | HR 84 | Ht 59.0 in | Wt 160.0 lb

## 2019-04-07 DIAGNOSIS — J301 Allergic rhinitis due to pollen: Secondary | ICD-10-CM

## 2019-04-07 DIAGNOSIS — K219 Gastro-esophageal reflux disease without esophagitis: Secondary | ICD-10-CM | POA: Insufficient documentation

## 2019-04-07 DIAGNOSIS — J4520 Mild intermittent asthma, uncomplicated: Secondary | ICD-10-CM

## 2019-04-07 MED ORDER — FAMOTIDINE 20 MG PO TABS: 20.0000 mg | ORAL_TABLET | Freq: Two times a day (BID) | ORAL | 3 refills | Status: DC

## 2019-04-07 NOTE — Progress Notes (Signed)
[facility] [place], [place] [address]  Internal MEDICINE  Telephone Visit  Patient Name: Stacie Ellis  [id number]  [id number]  Date of Service: 04/07/2019  I connected with the patient at 9:51am by webcam and verified the patients identity using two identifiers.   I discussed the limitations, risks, security and privacy concerns of performing an evaluation and management service by webcam and the availability of in person appointments. I also discussed with the patient that there may be a patient responsible charge related to the service.  The patient expressed understanding and agrees to proceed.    Chief Complaint  Patient presents with  . Telephone Assessment  . Telephone Screen  . Hypertension  . Gastroesophageal Reflux    FEELS THAT SHE HAD MORE ACID REFLUX IN THE PAST 6 WEEKS, BRAND WAS CHANGED AND WANTED TO KNOW IF THAT WAS THE ISSUE  . Asthma    The patient has been contacted via webcam for follow up visit due to concerns for spread of novel coronavirus. The patient presents for routine visit. The patient states that she is having more severe acid reflux. This became most apparent about 4 to 6 weeks ago. About the same time, the pharmacy switched manufacturers of the current pantoprazole. Pharmacy did tell her this change was unlikely to cause increased symptoms, but not impossible.  States that her allergies are getting much worse, especially since she stopped taking allergy shots. She also had COVID earlier in the year. Since the combination of these events, she has noted increased wheezing. Still is not having to use rescue inhaler much at all, but she is using her Symbicort, two puffs twice daily, most days, where she wasn't doing that in the past. She does have appointment with allergist this coming Friday. She is unsure if they are able to do repeat pulmonary function test. Recently, they were not doing this type of testing in the office at all.  She sees Dr. Donneta Romberg, at [facility].       Current Medication: Outpatient Encounter Medications as of 04/07/2019  Medication Sig  . albuterol (PROVENTIL HFA;VENTOLIN HFA) 108 (90 Base) MCG/ACT inhaler Inhale 2 puffs into the lungs every 6 (six) hours as needed for wheezing or shortness of breath.  Marland Kitchen amLODipine (NORVASC) 2.5 MG tablet Take 1 tablet (2.5 mg total) by mouth 2 (two) times daily.  Marland Kitchen aspirin 81 MG tablet Take 81 mg by mouth daily.  . budesonide-formoterol (SYMBICORT) 80-4.5 MCG/ACT inhaler Inhale 2 puffs into the lungs 2 (two) times daily.  . carvedilol (COREG) 25 MG tablet TAKE 1 TABLET(25 MG) BY MOUTH TWICE DAILY  . hydrochlorothiazide (HYDRODIURIL) 25 MG tablet Take 1 tablet (25 mg total) by mouth every morning.  . loratadine (CLARITIN) 10 MG tablet Take 10 mg by mouth daily.  . montelukast (SINGULAIR) 10 MG tablet Take 10 mg by mouth at bedtime.  Marland Kitchen nystatin cream (MYCOSTATIN) Apply 1 application topically 2 (two) times daily.  . pantoprazole (PROTONIX) 40 MG tablet Take 1 tablet (40 mg total) by mouth daily.  Marland Kitchen telmisartan (MICARDIS) 20 MG tablet Take 1 tablet (20 mg total) by mouth daily.  . famotidine (PEPCID) 20 MG tablet Take 1 tablet (20 mg total) by mouth 2 (two) times daily.   No facility-administered encounter medications on file as of 04/07/2019.    Surgical History: Past Surgical History:  Procedure Laterality Date  . APPENDECTOMY  2008  . BREAST BIOPSY Left 2011   pt states had stereo  done on mobile unit at Dr. Dwyane Luo office. DCIS  . BREAST LUMPECTOMY Left 02/2010   lumpectomy with re excision 03/2010 of left breast for cancer and rad tx  . BREAST SURGERY Left 02/17/2010;03/10/2010   lumpectomy and repeat wide excision  . CESAREAN SECTION    . COLON SURGERY Right 06/13/2006   Hand-assisted right hemicolectomy. 5.8 cm villous adenoma without atypia  . COLONOSCOPY  2015   Normal exam.  . ESOPHAGOGASTRODUODENOSCOPY (EGD) WITH PROPOFOL N/A 06/21/2015    Procedure: ESOPHAGOGASTRODUODENOSCOPY (EGD) WITH PROPOFOL;  Surgeon: Hulen Luster, MD;  Location: [facility] ENDOSCOPY;  Service: Gastroenterology;  Laterality: N/A;    Medical History: Past Medical History:  Diagnosis Date  . Asthma   . Breast cancer ([place]) 02/2010  . GERD (gastroesophageal reflux disease) 2008  . Hypertension   . Malignant neoplasm of upper-outer quadrant of female breast ([place]) 2011   Intermediate grade DCIS, ER: 50%; PR 10%. left breast lumpectomy, wide excision and a repeat wide excision on 03/10/2010 for multiple positive margins on original resection  . Malignant neoplasm of upper-outer quadrant of female breast [facility]) January 2012:    Re-excision to negative margins.   . Obesity, unspecified   . Postmenopausal bleeding 2013  . S/P radiation therapy 2012   whole breast,   . Special screening for malignant neoplasms, colon     Family History: Family History  Problem Relation Age of Onset  . Cancer Father        colon  . Cancer Other        unknown family member with breast cancer  . Colon polyps Other        unknown family member with colon polyps    Social History   Socioeconomic History  . Marital status: Married    Spouse name: Not on file  . Number of children: Not on file  . Years of education: Not on file  . Highest education level: Not on file  Occupational History  . Not on file  Tobacco Use  . Smoking status: Never Smoker  . Smokeless tobacco: Never Used  Substance and Sexual Activity  . Alcohol use: No  . Drug use: No  . Sexual activity: Yes    Birth control/protection: Post-menopausal  Other Topics Concern  . Not on file  Social History Narrative  . Not on file   Social Determinants of Health   Financial Resource Strain:   . Difficulty of Paying Living Expenses: Not on file  Food Insecurity:   . Worried About Charity fundraiser in the Last Year: Not on file  . Ran Out of Food in the Last Year: Not on file  Transportation Needs:   .  Lack of Transportation (Medical): Not on file  . Lack of Transportation (Non-Medical): Not on file  Physical Activity:   . Days of Exercise per Week: Not on file  . Minutes of Exercise per Session: Not on file  Stress:   . Feeling of Stress : Not on file  Social Connections:   . Frequency of Communication with Friends and Family: Not on file  . Frequency of Social Gatherings with Friends and Family: Not on file  . Attends Religious Services: Not on file  . Active Member of Clubs or Organizations: Not on file  . Attends Archivist Meetings: Not on file  . Marital Status: Not on file  Intimate Partner Violence:   . Fear of Current or Ex-Partner: Not on file  . Emotionally Abused:  Not on file  . Physically Abused: Not on file  . Sexually Abused: Not on file      Review of Systems  Constitutional: Negative for activity change, chills, fatigue and unexpected weight change.  HENT: Negative for congestion, postnasal drip, rhinorrhea, sneezing and sore throat.   Respiratory: Positive for wheezing. Negative for cough, chest tightness and shortness of breath.        Has noted more wheezing recently   Cardiovascular: Negative for chest pain and palpitations.  Gastrointestinal: Negative for abdominal pain, constipation, diarrhea, nausea and vomiting.       Worse acid reflux.   Endocrine: Negative for cold intolerance, heat intolerance, polydipsia and polyuria.  Musculoskeletal: Negative for arthralgias, back pain, joint swelling, neck pain and neck stiffness.  Skin: Negative for rash.  Allergic/Immunologic: Positive for environmental allergies.  Neurological: Negative for tremors, numbness and headaches.  Hematological: Negative for adenopathy. Does not bruise/bleed easily.  Psychiatric/Behavioral: Negative for behavioral problems (Depression), dysphoric mood, sleep disturbance and suicidal ideas. The patient is not nervous/anxious.     Today's Vitals   04/07/19 0910  BP:  135/86  Pulse: 84  SpO2: 96%  Weight: 160 lb (72.6 kg)  Height: 4\' 11"  (1.499 m)   Body mass index is 32.32 kg/m.  Observation/Objective:   The patient is alert and oriented. She is pleasant and answers all questions appropriately. Breathing is non-labored. She is in no acute distress at this time. Patient has mild, dry, non-productive cough present.    Assessment/Plan: 1. Gastroesophageal reflux disease without esophagitis Add pepcid 20mg  up to twice daily if needed for reflux. Continue pantoprazole as prescribed  - famotidine (PEPCID) 20 MG tablet; Take 1 tablet (20 mg total) by mouth 2 (two) times daily.  Dispense: 60 tablet; Refill: 3  2. Allergic rhinitis due to pollen, unspecified seasonality Patient to see immunologist on Friday for further evaluation and treatment.   3. Mild intermittent asthma without complication Patient to ask immunologist on Friday if they can do PFT. If not, will order in this office and follow up with her appropriately.   General Counseling: laerica weismann understanding of the findings of today's phone visit and agrees with plan of treatment. I have discussed any further diagnostic evaluation that may be needed or ordered today. We also reviewed her medications today. she has been encouraged to call the office with any questions or concerns that should arise related to todays visit.  This patient was seen by [place] with Dr Lavera Guise as a part of collaborative care agreement  Meds ordered this encounter  Medications  . famotidine (PEPCID) 20 MG tablet    Sig: Take 1 tablet (20 mg total) by mouth 2 (two) times daily.    Dispense:  60 tablet    Refill:  3    Order Specific Question:   Supervising Provider    Answer:   Lavera Guise [id number]    Time spent: 46 Minutes    Dr Lavera Guise Internal medicine

## 2019-04-11 ENCOUNTER — Other Ambulatory Visit (HOSPITAL_COMMUNITY)
Admission: RE | Admit: 2019-04-11 | Discharge: 2019-04-11 | Disposition: A | Discharge status: Home / Self Care | Payer: PPO | Source: Ambulatory Visit | Attending: Obstetrics and Gynecology | Admitting: Obstetrics and Gynecology

## 2019-04-11 ENCOUNTER — Other Ambulatory Visit: Payer: Self-pay

## 2019-04-11 ENCOUNTER — Ambulatory Visit (INDEPENDENT_AMBULATORY_CARE_PROVIDER_SITE_OTHER): Payer: PPO | Admitting: Obstetrics and Gynecology

## 2019-04-11 ENCOUNTER — Encounter: Payer: Self-pay | Admitting: Obstetrics and Gynecology

## 2019-04-11 VITALS — BP 144/82 | HR 82 | Ht 60.0 in | Wt 167.0 lb

## 2019-04-11 DIAGNOSIS — Z1211 Encounter for screening for malignant neoplasm of colon: Secondary | ICD-10-CM

## 2019-04-11 DIAGNOSIS — Z13820 Encounter for screening for osteoporosis: Secondary | ICD-10-CM

## 2019-04-11 DIAGNOSIS — Z1151 Encounter for screening for human papillomavirus (HPV): Secondary | ICD-10-CM | POA: Insufficient documentation

## 2019-04-11 DIAGNOSIS — Z01419 Encounter for gynecological examination (general) (routine) without abnormal findings: Secondary | ICD-10-CM

## 2019-04-11 DIAGNOSIS — Z124 Encounter for screening for malignant neoplasm of cervix: Secondary | ICD-10-CM | POA: Diagnosis not present

## 2019-04-11 DIAGNOSIS — Z780 Asymptomatic menopausal state: Secondary | ICD-10-CM | POA: Diagnosis not present

## 2019-04-11 DIAGNOSIS — Z1231 Encounter for screening mammogram for malignant neoplasm of breast: Secondary | ICD-10-CM

## 2019-04-11 NOTE — Progress Notes (Signed)
Gynecology Annual Exam  PCP: Lavera Guise, MD  Chief Complaint:  Chief Complaint  Patient presents with  . Gynecologic Exam    History of Present Illness:Patient is a 64 y.o. G2P0011 presents for annual exam. The patient has no complaints today.   LMP: No LMP recorded. Patient is postmenopausal. No postmenopausal bleeding  The patient does perform self breast exams.  She has a personal history of breast cancer left lumpectomy 10 years ago.  The patient wears seatbelts: yes.   The patient has regular exercise: not asked.    The patient denies current symptoms of depression.     Review of Systems: Review of Systems  Constitutional: Negative for chills and fever.  HENT: Negative for congestion.   Respiratory: Negative for cough and shortness of breath.   Cardiovascular: Negative for chest pain and palpitations.  Gastrointestinal: Negative for abdominal pain, constipation, diarrhea, heartburn, nausea and vomiting.  Genitourinary: Negative for dysuria, frequency and urgency.  Skin: Negative for itching and rash.  Neurological: Negative for dizziness and headaches.  Endo/Heme/Allergies: Negative for polydipsia.  Psychiatric/Behavioral: Negative for depression.    Past Medical History:  Past Medical History:  Diagnosis Date  . Asthma   . Breast cancer ([place]) 02/2010  . GERD (gastroesophageal reflux disease) 2008  . Hypertension   . Malignant neoplasm of upper-outer quadrant of female breast ([place]) 2011   Intermediate grade DCIS, ER: 50%; PR 10%. left breast lumpectomy, wide excision and a repeat wide excision on 03/10/2010 for multiple positive margins on original resection  . Malignant neoplasm of upper-outer quadrant of female breast [facility]) January 2012:    Re-excision to negative margins.   . Obesity, unspecified   . Postmenopausal bleeding 2013  . S/P radiation therapy 2012   whole breast,   . Special screening for malignant neoplasms, colon     Past Surgical  History:  Past Surgical History:  Procedure Laterality Date  . APPENDECTOMY  2008  . BREAST BIOPSY Left 2011   pt states had stereo done on mobile unit at Dr. Dwyane Luo office. DCIS  . BREAST LUMPECTOMY Left 02/2010   lumpectomy with re excision 03/2010 of left breast for cancer and rad tx  . BREAST SURGERY Left 02/17/2010;03/10/2010   lumpectomy and repeat wide excision  . CESAREAN SECTION    . COLON SURGERY Right 06/13/2006   Hand-assisted right hemicolectomy. 5.8 cm villous adenoma without atypia  . COLONOSCOPY  2015   Normal exam.  . ESOPHAGOGASTRODUODENOSCOPY (EGD) WITH PROPOFOL N/A 06/21/2015   Procedure: ESOPHAGOGASTRODUODENOSCOPY (EGD) WITH PROPOFOL;  Surgeon: Hulen Luster, MD;  Location[facility] ENDOSCOPY;  Service: Gastroenterology;  Laterality: N/A;    Gynecologic History:  No LMP recorded. Patient is postmenopausal. Mammogram: 07/24/2018 BI-RADI Last Pap: Results were:11/20/2016 NIL and HR HPV negative   Obstetric History: G2P0011  Family History:  Family History  Problem Relation Age of Onset  . Cancer Father        colon  . Cancer Other        unknown family member with breast cancer  . Colon polyps Other        unknown family member with colon polyps    Social History:  Social History   Socioeconomic History  . Marital status: Married    Spouse name: Not on file  . Number of children: Not on file  . Years of education: Not on file  . Highest education level: Not on file  Occupational History  . Not on file  Tobacco Use  . Smoking status: Never Smoker  . Smokeless tobacco: Never Used  Substance and Sexual Activity  . Alcohol use: No  . Drug use: No  . Sexual activity: Yes    Birth control/protection: Post-menopausal  Other Topics Concern  . Not on file  Social History Narrative  . Not on file   Social Determinants of Health   Financial Resource Strain:   . Difficulty of Paying Living Expenses: Not on file  Food Insecurity:   . Worried About Paediatric nurse in the Last Year: Not on file  . Ran Out of Food in the Last Year: Not on file  Transportation Needs:   . Lack of Transportation (Medical): Not on file  . Lack of Transportation (Non-Medical): Not on file  Physical Activity:   . Days of Exercise per Week: Not on file  . Minutes of Exercise per Session: Not on file  Stress:   . Feeling of Stress : Not on file  Social Connections:   . Frequency of Communication with Friends and Family: Not on file  . Frequency of Social Gatherings with Friends and Family: Not on file  . Attends Religious Services: Not on file  . Active Member of Clubs or Organizations: Not on file  . Attends Archivist Meetings: Not on file  . Marital Status: Not on file  Intimate Partner Violence:   . Fear of Current or Ex-Partner: Not on file  . Emotionally Abused: Not on file  . Physically Abused: Not on file  . Sexually Abused: Not on file    Allergies:  Allergies  Allergen Reactions  . Levaquin [Levofloxacin] Other (See Comments)    Severe tightness in left leg  . Penicillins Rash    Medications: Prior to Admission medications   Medication Sig Start Date End Date Taking? Authorizing Provider  albuterol (PROVENTIL HFA;VENTOLIN HFA) 108 (90 Base) MCG/ACT inhaler Inhale 2 puffs into the lungs every 6 (six) hours as needed for wheezing or shortness of breath.    [provider]  amLODipine (NORVASC) 2.5 MG tablet Take 1 tablet (2.5 mg total) by mouth 2 (two) times daily. 04/01/19   Ronnell Freshwater, NP  aspirin 81 MG tablet Take 81 mg by mouth daily.    [provider]  budesonide-formoterol (SYMBICORT) 80-4.5 MCG/ACT inhaler Inhale 2 puffs into the lungs 2 (two) times daily.    [provider]  carvedilol (COREG) 25 MG tablet TAKE 1 TABLET(25 MG) BY MOUTH TWICE DAILY 04/01/19   Ronnell Freshwater, NP  famotidine (PEPCID) 20 MG tablet Take 1 tablet (20 mg total) by mouth 2 (two) times daily. 04/07/19   Ronnell Freshwater, NP  hydrochlorothiazide (HYDRODIURIL) 25 MG tablet Take 1 tablet (25 mg total) by mouth every morning. 04/01/19   Ronnell Freshwater, NP  loratadine (CLARITIN) 10 MG tablet Take 10 mg by mouth daily.    [provider]  montelukast (SINGULAIR) 10 MG tablet Take 10 mg by mouth at bedtime.    [provider]  nystatin cream (MYCOSTATIN) Apply 1 application topically 2 (two) times daily. 11/20/16   Malachy Mood, MD  pantoprazole (PROTONIX) 40 MG tablet Take 1 tablet (40 mg total) by mouth daily. 04/01/19   Ronnell Freshwater, NP  telmisartan (MICARDIS) 20 MG tablet Take 1 tablet (20 mg total) by mouth daily. 12/02/18   Ronnell Freshwater, NP    Physical Exam Vitals: There were no vitals taken for this visit.  General: NAD HEENT: normocephalic, anicteric Thyroid: no enlargement, no palpable nodules Pulmonary: No increased work of breathing, CTAB Cardiovascular: RRR, distal pulses 2+ Breast: Breast symmetrical, no tenderness, no palpable nodules or masses, no skin or nipple retraction present, no nipple discharge.  No axillary or supraclavicular lymphadenopathy. Abdomen: NABS, soft, non-tender, non-distended.  Umbilicus without lesions.  No hepatomegaly, splenomegaly or masses palpable. No evidence of hernia  Genitourinary:  External: Normal external female genitalia.  Normal urethral meatus, normal Bartholin's and Skene's glands.    Vagina: Normal vaginal mucosa, no evidence of prolapse.    Cervix: Grossly normal in appearance, no bleeding  Uterus: Non-enlarged, mobile, normal contour.  No CMT  Adnexa: ovaries non-enlarged, no adnexal masses  Rectal: deferred  Lymphatic: no evidence of inguinal lymphadenopathy Extremities: no edema, erythema, or tenderness Neurologic: Grossly intact Psychiatric: mood appropriate, affect full  Female chaperone present for pelvic and breast  portions of the physical exam     Assessment: 64 y.o. G2P0011 routine annual  exam  Plan: Problem List Items Addressed This Visit    None    Visit Diagnoses    Encounter for gynecological examination without abnormal finding    -  Primary   Screening for malignant neoplasm of cervix       Relevant Orders   Cytology - PAP   Colon cancer screening       Relevant Orders   Ambulatory referral to Gastroenterology   Postmenopausal estrogen deficiency       Relevant Orders   DG Bone Density   Screening for osteoporosis       Relevant Orders   DG Bone Density   Breast cancer screening by mammogram       Relevant Orders   MM 3D SCREEN BREAST BILATERAL      1) Mammogram - recommend yearly screening mammogram.  Mammogram Is up to date  2) STI screening  was notoffered and therefore not obtained  3) ASCCP guidelines and rational discussed.  Patient opts for yearly screening interval  4) Osteoporosis  - per USPTF routine screening DEXA at age 88  5) Routine healthcare maintenance including cholesterol, diabetes screening discussed managed by PCP  6) Colonoscopy  - 05/30/06 Villious adenoma - 09/09/13 normal with repeat in 5 years 2020  7) Return in about 1 year (around 04/10/2020) for annual.    Malachy Mood, MD [place] P[place], [facility] 04/11/2019, 2:18 PM

## 2019-04-11 NOTE — Patient Instructions (Signed)
[facility] [address] [place] [address]  [facility] [place]  [address] [place] [address]  Phone: [phone]  

## 2019-04-15 DIAGNOSIS — J301 Allergic rhinitis due to pollen: Secondary | ICD-10-CM | POA: Diagnosis not present

## 2019-04-15 DIAGNOSIS — J4530 Mild persistent asthma, uncomplicated: Secondary | ICD-10-CM | POA: Diagnosis not present

## 2019-04-15 DIAGNOSIS — J3089 Other allergic rhinitis: Secondary | ICD-10-CM | POA: Diagnosis not present

## 2019-04-15 DIAGNOSIS — J3081 Allergic rhinitis due to animal (cat) (dog) hair and dander: Secondary | ICD-10-CM | POA: Diagnosis not present

## 2019-04-15 LAB — CYTOLOGY - PAP
Comment: NEGATIVE
Diagnosis: NEGATIVE
High risk HPV: NEGATIVE

## 2019-04-18 DIAGNOSIS — E063 Autoimmune thyroiditis: Secondary | ICD-10-CM | POA: Diagnosis not present

## 2019-04-18 DIAGNOSIS — K219 Gastro-esophageal reflux disease without esophagitis: Secondary | ICD-10-CM | POA: Diagnosis not present

## 2019-04-18 DIAGNOSIS — I10 Essential (primary) hypertension: Secondary | ICD-10-CM | POA: Diagnosis not present

## 2019-04-18 DIAGNOSIS — C50919 Malignant neoplasm of unspecified site of unspecified female breast: Secondary | ICD-10-CM | POA: Diagnosis not present

## 2019-04-18 DIAGNOSIS — L209 Atopic dermatitis, unspecified: Secondary | ICD-10-CM | POA: Diagnosis not present

## 2019-04-18 DIAGNOSIS — E042 Nontoxic multinodular goiter: Secondary | ICD-10-CM | POA: Diagnosis not present

## 2019-04-18 DIAGNOSIS — I493 Ventricular premature depolarization: Secondary | ICD-10-CM | POA: Diagnosis not present

## 2019-04-21 DIAGNOSIS — J3081 Allergic rhinitis due to animal (cat) (dog) hair and dander: Secondary | ICD-10-CM | POA: Diagnosis not present

## 2019-04-21 DIAGNOSIS — J301 Allergic rhinitis due to pollen: Secondary | ICD-10-CM | POA: Diagnosis not present

## 2019-04-21 DIAGNOSIS — J3089 Other allergic rhinitis: Secondary | ICD-10-CM | POA: Diagnosis not present

## 2019-04-25 DIAGNOSIS — L209 Atopic dermatitis, unspecified: Secondary | ICD-10-CM | POA: Diagnosis not present

## 2019-04-25 DIAGNOSIS — E6609 Other obesity due to excess calories: Secondary | ICD-10-CM | POA: Diagnosis not present

## 2019-04-25 DIAGNOSIS — E063 Autoimmune thyroiditis: Secondary | ICD-10-CM | POA: Diagnosis not present

## 2019-04-25 DIAGNOSIS — I493 Ventricular premature depolarization: Secondary | ICD-10-CM | POA: Diagnosis not present

## 2019-04-25 DIAGNOSIS — I10 Essential (primary) hypertension: Secondary | ICD-10-CM | POA: Diagnosis not present

## 2019-04-25 DIAGNOSIS — C50919 Malignant neoplasm of unspecified site of unspecified female breast: Secondary | ICD-10-CM | POA: Diagnosis not present

## 2019-04-25 DIAGNOSIS — Z6833 Body mass index (BMI) 33.0-33.9, adult: Secondary | ICD-10-CM | POA: Diagnosis not present

## 2019-04-25 DIAGNOSIS — K219 Gastro-esophageal reflux disease without esophagitis: Secondary | ICD-10-CM | POA: Diagnosis not present

## 2019-04-25 DIAGNOSIS — E042 Nontoxic multinodular goiter: Secondary | ICD-10-CM | POA: Diagnosis not present

## 2019-04-29 DIAGNOSIS — J3081 Allergic rhinitis due to animal (cat) (dog) hair and dander: Secondary | ICD-10-CM | POA: Diagnosis not present

## 2019-04-29 DIAGNOSIS — J301 Allergic rhinitis due to pollen: Secondary | ICD-10-CM | POA: Diagnosis not present

## 2019-04-29 DIAGNOSIS — J3089 Other allergic rhinitis: Secondary | ICD-10-CM | POA: Diagnosis not present

## 2019-04-30 ENCOUNTER — Encounter: Payer: Self-pay | Admitting: *Deleted

## 2019-05-02 DIAGNOSIS — J3081 Allergic rhinitis due to animal (cat) (dog) hair and dander: Secondary | ICD-10-CM | POA: Diagnosis not present

## 2019-05-02 DIAGNOSIS — J3089 Other allergic rhinitis: Secondary | ICD-10-CM | POA: Diagnosis not present

## 2019-05-02 DIAGNOSIS — J301 Allergic rhinitis due to pollen: Secondary | ICD-10-CM | POA: Diagnosis not present

## 2019-05-06 ENCOUNTER — Telehealth: Payer: Self-pay

## 2019-05-06 NOTE — Telephone Encounter (Signed)
Returned patients call to schedule her colonoscopy.  LVM for her to call office back to schedule.  Thanks,  [place], [place]

## 2019-05-07 ENCOUNTER — Telehealth: Payer: Self-pay

## 2019-05-07 ENCOUNTER — Other Ambulatory Visit: Payer: Self-pay

## 2019-05-07 DIAGNOSIS — Z800 Family history of malignant neoplasm of digestive organs: Secondary | ICD-10-CM

## 2019-05-07 DIAGNOSIS — Z86010 Personal history of colonic polyps: Secondary | ICD-10-CM

## 2019-05-07 DIAGNOSIS — Z1211 Encounter for screening for malignant neoplasm of colon: Secondary | ICD-10-CM

## 2019-05-07 NOTE — Telephone Encounter (Signed)
Gastroenterology Pre-Procedure Review  Request Date: 05/29/19 Requesting Physician: Dr. Allen Norris  PATIENT REVIEW QUESTIONS: The patient responded to the following health history questions as indicated:    1. Are you having any GI issues? no 2. Do you have a personal history of Polyps? yes (2015) 3. Do you have a family history of Colon Cancer or Polyps? yes (father colon cancer) 4. Diabetes Mellitus? no 5. Joint replacements in the past 12 months?no 6. Major health problems in the past 3 months?No however pt had colon resection 2008 with Dr. Fleet Contras 7. Any artificial heart valves, MVP, or defibrillator?no    MEDICATIONS & ALLERGIES:    Patient reports the following regarding taking any anticoagulation/antiplatelet therapy:   Plavix, Coumadin, Eliquis, Xarelto, Lovenox, Pradaxa, Brilinta, or Effient? no Aspirin? yes (81 mg)  Patient confirms/reports the following medications:  Current Outpatient Medications  Medication Sig Dispense Refill  . albuterol (PROVENTIL HFA;VENTOLIN HFA) 108 (90 Base) MCG/ACT inhaler Inhale 2 puffs into the lungs every 6 (six) hours as needed for wheezing or shortness of breath.    Marland Kitchen amLODipine (NORVASC) 2.5 MG tablet Take 1 tablet (2.5 mg total) by mouth 2 (two) times daily. 60 tablet 2  . aspirin 81 MG tablet Take 81 mg by mouth daily.    . budesonide-formoterol (SYMBICORT) 80-4.5 MCG/ACT inhaler Inhale 2 puffs into the lungs 2 (two) times daily.    . carvedilol (COREG) 25 MG tablet TAKE 1 TABLET(25 MG) BY MOUTH TWICE DAILY 60 tablet 2  . famotidine (PEPCID) 20 MG tablet Take 1 tablet (20 mg total) by mouth 2 (two) times daily. 60 tablet 3  . hydrochlorothiazide (HYDRODIURIL) 25 MG tablet Take 1 tablet (25 mg total) by mouth every morning. 30 tablet 5  . loratadine (CLARITIN) 10 MG tablet Take 10 mg by mouth daily.    . montelukast (SINGULAIR) 10 MG tablet Take 10 mg by mouth at bedtime.    Marland Kitchen nystatin cream (MYCOSTATIN) Apply 1 application topically 2 (two)  times daily. 30 g 1  . pantoprazole (PROTONIX) 40 MG tablet Take 1 tablet (40 mg total) by mouth daily. 30 tablet 1  . telmisartan (MICARDIS) 20 MG tablet Take 1 tablet (20 mg total) by mouth daily. 30 tablet 5   No current facility-administered medications for this visit.    Patient confirms/reports the following allergies:  Allergies  Allergen Reactions  . Levaquin [Levofloxacin] Other (See Comments)    Severe tightness in left leg  . Penicillins Rash    No orders of the defined types were placed in this encounter.   AUTHORIZATION INFORMATION Primary Insurance: 1D#: Group #:  Secondary Insurance: 1D#: Group #:  SCHEDULE INFORMATION: Date: 05/29/19 Time: Location:[facility]

## 2019-05-08 DIAGNOSIS — J3081 Allergic rhinitis due to animal (cat) (dog) hair and dander: Secondary | ICD-10-CM | POA: Diagnosis not present

## 2019-05-08 DIAGNOSIS — J3089 Other allergic rhinitis: Secondary | ICD-10-CM | POA: Diagnosis not present

## 2019-05-08 DIAGNOSIS — J301 Allergic rhinitis due to pollen: Secondary | ICD-10-CM | POA: Diagnosis not present

## 2019-05-15 DIAGNOSIS — J3089 Other allergic rhinitis: Secondary | ICD-10-CM | POA: Diagnosis not present

## 2019-05-15 DIAGNOSIS — J3081 Allergic rhinitis due to animal (cat) (dog) hair and dander: Secondary | ICD-10-CM | POA: Diagnosis not present

## 2019-05-15 DIAGNOSIS — J301 Allergic rhinitis due to pollen: Secondary | ICD-10-CM | POA: Diagnosis not present

## 2019-05-27 ENCOUNTER — Telehealth: Payer: Self-pay

## 2019-05-27 ENCOUNTER — Other Ambulatory Visit
Admission: RE | Admit: 2019-05-27 | Discharge: 2019-05-27 | Disposition: A | Discharge status: Home / Self Care | Payer: PPO | Source: Ambulatory Visit | Attending: Gastroenterology | Admitting: Gastroenterology

## 2019-05-27 ENCOUNTER — Other Ambulatory Visit: Payer: Self-pay

## 2019-05-27 DIAGNOSIS — J45909 Unspecified asthma, uncomplicated: Secondary | ICD-10-CM | POA: Diagnosis not present

## 2019-05-27 DIAGNOSIS — Z7951 Long term (current) use of inhaled steroids: Secondary | ICD-10-CM | POA: Diagnosis not present

## 2019-05-27 DIAGNOSIS — Z800 Family history of malignant neoplasm of digestive organs: Secondary | ICD-10-CM | POA: Diagnosis not present

## 2019-05-27 DIAGNOSIS — Z20822 Contact with and (suspected) exposure to covid-19: Secondary | ICD-10-CM | POA: Diagnosis not present

## 2019-05-27 DIAGNOSIS — Z803 Family history of malignant neoplasm of breast: Secondary | ICD-10-CM | POA: Diagnosis not present

## 2019-05-27 DIAGNOSIS — K219 Gastro-esophageal reflux disease without esophagitis: Secondary | ICD-10-CM | POA: Diagnosis not present

## 2019-05-27 DIAGNOSIS — Z880 Allergy status to penicillin: Secondary | ICD-10-CM | POA: Diagnosis not present

## 2019-05-27 DIAGNOSIS — K5730 Diverticulosis of large intestine without perforation or abscess without bleeding: Secondary | ICD-10-CM | POA: Diagnosis not present

## 2019-05-27 DIAGNOSIS — J3089 Other allergic rhinitis: Secondary | ICD-10-CM | POA: Diagnosis not present

## 2019-05-27 DIAGNOSIS — J3081 Allergic rhinitis due to animal (cat) (dog) hair and dander: Secondary | ICD-10-CM | POA: Diagnosis not present

## 2019-05-27 DIAGNOSIS — Z7982 Long term (current) use of aspirin: Secondary | ICD-10-CM | POA: Diagnosis not present

## 2019-05-27 DIAGNOSIS — Z9049 Acquired absence of other specified parts of digestive tract: Secondary | ICD-10-CM | POA: Diagnosis not present

## 2019-05-27 DIAGNOSIS — Z85038 Personal history of other malignant neoplasm of large intestine: Secondary | ICD-10-CM | POA: Diagnosis not present

## 2019-05-27 DIAGNOSIS — Z6832 Body mass index (BMI) 32.0-32.9, adult: Secondary | ICD-10-CM | POA: Diagnosis not present

## 2019-05-27 DIAGNOSIS — I10 Essential (primary) hypertension: Secondary | ICD-10-CM | POA: Diagnosis not present

## 2019-05-27 DIAGNOSIS — Z881 Allergy status to other antibiotic agents status: Secondary | ICD-10-CM | POA: Diagnosis not present

## 2019-05-27 DIAGNOSIS — K635 Polyp of colon: Secondary | ICD-10-CM | POA: Diagnosis not present

## 2019-05-27 DIAGNOSIS — Z1211 Encounter for screening for malignant neoplasm of colon: Secondary | ICD-10-CM | POA: Diagnosis not present

## 2019-05-27 DIAGNOSIS — J301 Allergic rhinitis due to pollen: Secondary | ICD-10-CM | POA: Diagnosis not present

## 2019-05-27 DIAGNOSIS — Z923 Personal history of irradiation: Secondary | ICD-10-CM | POA: Diagnosis not present

## 2019-05-27 DIAGNOSIS — E669 Obesity, unspecified: Secondary | ICD-10-CM | POA: Diagnosis not present

## 2019-05-27 DIAGNOSIS — D124 Benign neoplasm of descending colon: Secondary | ICD-10-CM | POA: Diagnosis not present

## 2019-05-27 DIAGNOSIS — K641 Second degree hemorrhoids: Secondary | ICD-10-CM | POA: Diagnosis not present

## 2019-05-27 DIAGNOSIS — Z79899 Other long term (current) drug therapy: Secondary | ICD-10-CM | POA: Diagnosis not present

## 2019-05-27 DIAGNOSIS — Z853 Personal history of malignant neoplasm of breast: Secondary | ICD-10-CM | POA: Diagnosis not present

## 2019-05-27 LAB — SARS CORONAVIRUS 2 (TAT 6-24 HRS): SARS Coronavirus 2: NEGATIVE

## 2019-05-27 NOTE — Telephone Encounter (Signed)
Patients call has been returned.  LVM for her letting her know that yes she can have her allergy shot, and continue 81mg  Aspirin.  Asked her to call me back if she has any other concerns.  Thanks,  [place], [place]

## 2019-05-29 ENCOUNTER — Encounter
Admission: RE | Disposition: A | Discharge status: Home / Self Care | Payer: Self-pay | Source: Ambulatory Visit | Attending: Gastroenterology

## 2019-05-29 ENCOUNTER — Ambulatory Visit: Payer: PPO | Admitting: Certified Registered Nurse Anesthetist

## 2019-05-29 ENCOUNTER — Other Ambulatory Visit: Payer: Self-pay

## 2019-05-29 ENCOUNTER — Encounter: Payer: Self-pay | Admitting: Gastroenterology

## 2019-05-29 ENCOUNTER — Ambulatory Visit
Admission: RE | Admit: 2019-05-29 | Discharge: 2019-05-29 | Disposition: A | Discharge status: Home / Self Care | Payer: PPO | Source: Ambulatory Visit | Attending: Gastroenterology | Admitting: Gastroenterology

## 2019-05-29 DIAGNOSIS — Z800 Family history of malignant neoplasm of digestive organs: Secondary | ICD-10-CM | POA: Diagnosis not present

## 2019-05-29 DIAGNOSIS — K641 Second degree hemorrhoids: Secondary | ICD-10-CM | POA: Insufficient documentation

## 2019-05-29 DIAGNOSIS — I10 Essential (primary) hypertension: Secondary | ICD-10-CM | POA: Insufficient documentation

## 2019-05-29 DIAGNOSIS — Z803 Family history of malignant neoplasm of breast: Secondary | ICD-10-CM | POA: Insufficient documentation

## 2019-05-29 DIAGNOSIS — Z880 Allergy status to penicillin: Secondary | ICD-10-CM | POA: Insufficient documentation

## 2019-05-29 DIAGNOSIS — K649 Unspecified hemorrhoids: Secondary | ICD-10-CM | POA: Diagnosis not present

## 2019-05-29 DIAGNOSIS — Z85038 Personal history of other malignant neoplasm of large intestine: Secondary | ICD-10-CM | POA: Insufficient documentation

## 2019-05-29 DIAGNOSIS — Z7982 Long term (current) use of aspirin: Secondary | ICD-10-CM | POA: Insufficient documentation

## 2019-05-29 DIAGNOSIS — Z20822 Contact with and (suspected) exposure to covid-19: Secondary | ICD-10-CM | POA: Insufficient documentation

## 2019-05-29 DIAGNOSIS — Z86010 Personal history of colonic polyps: Secondary | ICD-10-CM | POA: Diagnosis not present

## 2019-05-29 DIAGNOSIS — E782 Mixed hyperlipidemia: Secondary | ICD-10-CM | POA: Diagnosis not present

## 2019-05-29 DIAGNOSIS — E669 Obesity, unspecified: Secondary | ICD-10-CM | POA: Insufficient documentation

## 2019-05-29 DIAGNOSIS — Z923 Personal history of irradiation: Secondary | ICD-10-CM | POA: Insufficient documentation

## 2019-05-29 DIAGNOSIS — Z79899 Other long term (current) drug therapy: Secondary | ICD-10-CM | POA: Insufficient documentation

## 2019-05-29 DIAGNOSIS — D124 Benign neoplasm of descending colon: Secondary | ICD-10-CM | POA: Insufficient documentation

## 2019-05-29 DIAGNOSIS — J45909 Unspecified asthma, uncomplicated: Secondary | ICD-10-CM | POA: Insufficient documentation

## 2019-05-29 DIAGNOSIS — Z1211 Encounter for screening for malignant neoplasm of colon: Secondary | ICD-10-CM | POA: Diagnosis not present

## 2019-05-29 DIAGNOSIS — Z853 Personal history of malignant neoplasm of breast: Secondary | ICD-10-CM | POA: Insufficient documentation

## 2019-05-29 DIAGNOSIS — K635 Polyp of colon: Secondary | ICD-10-CM | POA: Insufficient documentation

## 2019-05-29 DIAGNOSIS — Z881 Allergy status to other antibiotic agents status: Secondary | ICD-10-CM | POA: Insufficient documentation

## 2019-05-29 DIAGNOSIS — K219 Gastro-esophageal reflux disease without esophagitis: Secondary | ICD-10-CM | POA: Insufficient documentation

## 2019-05-29 DIAGNOSIS — Z7951 Long term (current) use of inhaled steroids: Secondary | ICD-10-CM | POA: Insufficient documentation

## 2019-05-29 DIAGNOSIS — K5730 Diverticulosis of large intestine without perforation or abscess without bleeding: Secondary | ICD-10-CM | POA: Insufficient documentation

## 2019-05-29 DIAGNOSIS — Z6832 Body mass index (BMI) 32.0-32.9, adult: Secondary | ICD-10-CM | POA: Insufficient documentation

## 2019-05-29 DIAGNOSIS — Z9049 Acquired absence of other specified parts of digestive tract: Secondary | ICD-10-CM | POA: Insufficient documentation

## 2019-05-29 HISTORY — PX: COLONOSCOPY WITH PROPOFOL: SHX5780

## 2019-05-29 SURGERY — COLONOSCOPY WITH PROPOFOL
Anesthesia: General

## 2019-05-29 MED ORDER — LIDOCAINE HCL (CARDIAC) PF 100 MG/5ML IV SOSY
PREFILLED_SYRINGE | INTRAVENOUS | Status: DC | PRN
  Administered 2019-05-29: 100 mg via INTRAVENOUS

## 2019-05-29 MED ORDER — PROPOFOL 500 MG/50ML IV EMUL
INTRAVENOUS | Status: DC | PRN
  Administered 2019-05-29: 100 ug/kg/min via INTRAVENOUS

## 2019-05-29 MED ORDER — PROPOFOL 10 MG/ML IV BOLUS
INTRAVENOUS | Status: DC | PRN
  Administered 2019-05-29: 80 mg via INTRAVENOUS

## 2019-05-29 MED ORDER — SODIUM CHLORIDE 0.9 % IV SOLN: INTRAVENOUS | Status: DC

## 2019-05-29 NOTE — H&P (Signed)
Stacie Lame, MD [address] [place], [place] [address] Phone:[phone] Fax : [phone]  Primary Care Physician:  Lavera Guise, MD Primary Gastroenterologist:  Dr. Allen Norris  Pre-Procedure History & Physical: HPI:  Stacie Ellis is a 64 y.o. female is here for an colonoscopy.   Past Medical History:  Diagnosis Date  . Asthma   . Breast cancer ([place]) 02/2010  . GERD (gastroesophageal reflux disease) 2008  . Hypertension   . Malignant neoplasm of upper-outer quadrant of female breast ([place]) 2011   Intermediate grade DCIS, ER: 50%; PR 10%. left breast lumpectomy, wide excision and a repeat wide excision on 03/10/2010 for multiple positive margins on original resection  . Malignant neoplasm of upper-outer quadrant of female breast [facility]) January 2012:    Re-excision to negative margins.   . Obesity, unspecified   . Postmenopausal bleeding 2013  . S/P radiation therapy 2012   whole breast,   . Special screening for malignant neoplasms, colon     Past Surgical History:  Procedure Laterality Date  . APPENDECTOMY  2008  . BREAST BIOPSY Left 2011   pt states had stereo done on mobile unit at Dr. Dwyane Luo office. DCIS  . BREAST LUMPECTOMY Left 02/2010   lumpectomy with re excision 03/2010 of left breast for cancer and rad tx  . BREAST SURGERY Left 02/17/2010;03/10/2010   lumpectomy and repeat wide excision  . CESAREAN SECTION    . COLON SURGERY Right 06/13/2006   Hand-assisted right hemicolectomy. 5.8 cm villous adenoma without atypia  . COLONOSCOPY  2015   Normal exam.  . ESOPHAGOGASTRODUODENOSCOPY (EGD) WITH PROPOFOL N/A 06/21/2015   Procedure: ESOPHAGOGASTRODUODENOSCOPY (EGD) WITH PROPOFOL;  Surgeon: Hulen Luster, MD;  Location: [facility] ENDOSCOPY;  Service: Gastroenterology;  Laterality: N/A;    Prior to Admission medications   Medication Sig Start Date End Date Taking? Authorizing Provider  albuterol (PROVENTIL HFA;VENTOLIN HFA) 108 (90 Base) MCG/ACT inhaler  Inhale 2 puffs into the lungs every 6 (six) hours as needed for wheezing or shortness of breath.   Yes [provider]  amLODipine (NORVASC) 2.5 MG tablet Take 1 tablet (2.5 mg total) by mouth 2 (two) times daily. 04/01/19  Yes Ronnell Freshwater, NP  aspirin 81 MG tablet Take 81 mg by mouth daily.   Yes [provider]  budesonide-formoterol (SYMBICORT) 80-4.5 MCG/ACT inhaler Inhale 2 puffs into the lungs 2 (two) times daily.   Yes [provider]  carvedilol (COREG) 25 MG tablet TAKE 1 TABLET(25 MG) BY MOUTH TWICE DAILY 04/01/19  Yes Boscia, Heather E, NP  hydrochlorothiazide (HYDRODIURIL) 25 MG tablet Take 1 tablet (25 mg total) by mouth every morning. 04/01/19  Yes Boscia, Greer Ee, NP  loratadine (CLARITIN) 10 MG tablet Take 10 mg by mouth daily.   Yes [provider]  montelukast (SINGULAIR) 10 MG tablet Take 10 mg by mouth at bedtime.   Yes [provider]  nystatin cream (MYCOSTATIN) Apply 1 application topically 2 (two) times daily. 11/20/16  Yes Malachy Mood, MD  pantoprazole (PROTONIX) 40 MG tablet Take 1 tablet (40 mg total) by mouth daily. 04/01/19  Yes Boscia, Heather E, NP  telmisartan (MICARDIS) 20 MG tablet Take 1 tablet (20 mg total) by mouth daily. 12/02/18  Yes Boscia, Greer Ee, NP  famotidine (PEPCID) 20 MG tablet Take 1 tablet (20 mg total) by mouth 2 (two) times daily. Patient not taking: Reported on 05/29/2019 04/07/19   Ronnell Freshwater, NP    Allergies as  of 05/07/2019 - Review Complete 04/11/2019  Allergen Reaction Noted  . Levaquin [levofloxacin] Other (See Comments) 08/20/2012  . Penicillins Rash 08/20/2012    Family History  Problem Relation Age of Onset  . Cancer Father        colon  . Cancer Other        unknown family member with breast cancer  . Colon polyps Other        unknown family member with colon polyps    Social History   Socioeconomic History  . Marital status: Married    Spouse name: Not on file    . Number of children: Not on file  . Years of education: Not on file  . Highest education level: Not on file  Occupational History  . Not on file  Tobacco Use  . Smoking status: Never Smoker  . Smokeless tobacco: Never Used  Substance and Sexual Activity  . Alcohol use: No  . Drug use: No  . Sexual activity: Yes    Birth control/protection: Post-menopausal  Other Topics Concern  . Not on file  Social History Narrative  . Not on file   Social Determinants of Health   Financial Resource Strain:   . Difficulty of Paying Living Expenses:   Food Insecurity:   . Worried About Charity fundraiser in the Last Year:   . Arboriculturist in the Last Year:   Transportation Needs:   . Film/video editor (Medical):   Marland Kitchen Lack of Transportation (Non-Medical):   Physical Activity:   . Days of Exercise per Week:   . Minutes of Exercise per Session:   Stress:   . Feeling of Stress :   Social Connections:   . Frequency of Communication with Friends and Family:   . Frequency of Social Gatherings with Friends and Family:   . Attends Religious Services:   . Active Member of Clubs or Organizations:   . Attends Archivist Meetings:   Marland Kitchen Marital Status:   Intimate Partner Violence:   . Fear of Current or Ex-Partner:   . Emotionally Abused:   Marland Kitchen Physically Abused:   . Sexually Abused:     Review of Systems: See HPI, otherwise negative ROS  Physical Exam: BP 136/80   Pulse 84   Temp (!) 97.5 F (36.4 C) (Temporal)   Resp 16   Ht 4\' 11"  (1.499 m)   Wt 72.6 kg   SpO2 95%   BMI 32.32 kg/m  General:   Alert,  pleasant and cooperative in NAD Head:  Normocephalic and atraumatic. Neck:  Supple; no masses or thyromegaly. Lungs:  Clear throughout to auscultation.    Heart:  Regular rate and rhythm. Abdomen:  Soft, nontender and nondistended. Normal bowel sounds, without guarding, and without rebound.   Neurologic:  Alert and  oriented x4;  grossly normal  neurologically.  Impression/Plan: [name] is here for an colonoscopy to be performed for personal history of colonoscopy  Risks, benefits, limitations, and alternatives regarding  colonoscopy have been reviewed with the patient.  Questions have been answered.  All parties agreeable.   Stacie Lame, MD  05/29/2019, 12:37 PM

## 2019-05-29 NOTE — Anesthesia Preprocedure Evaluation (Signed)
Anesthesia Evaluation  Patient identified by MRN, date of birth, ID band Patient awake    Reviewed: Allergy & Precautions, H&P , NPO status , Patient's Chart, lab work & pertinent test results, reviewed documented beta blocker date and time   Airway Mallampati: II   Neck ROM: full    Dental  (+) Teeth Intact   Pulmonary asthma ,    Pulmonary exam normal        Cardiovascular Exercise Tolerance: Good hypertension, On Medications negative cardio ROS Normal cardiovascular exam Rhythm:regular Rate:Normal     Neuro/Psych negative neurological ROS  negative psych ROS   GI/Hepatic Neg liver ROS, GERD  Medicated,  Endo/Other  negative endocrine ROS  Renal/GU negative Renal ROS  negative genitourinary   Musculoskeletal   Abdominal   Peds  Hematology negative hematology ROS (+)   Anesthesia Other Findings Past Medical History: No date: Asthma 02/2010: Breast cancer ([place]) 2008: GERD (gastroesophageal reflux disease) No date: Hypertension 2011: Malignant neoplasm of upper-outer quadrant of female breast  ([place])     Comment:  Intermediate grade DCIS, ER: 50%; PR 10%. left breast               lumpectomy, wide excision and a repeat wide excision on               03/10/2010 for multiple positive margins on original               resection January 2012: : Malignant neoplasm of upper-outer quadrant of female  breast ([place])     Comment:  Re-excision to negative margins.  No date: Obesity, unspecified 2013: Postmenopausal bleeding 2012: S/P radiation therapy     Comment:  whole breast,  No date: Special screening for malignant neoplasms, colon Past Surgical History: 2008: APPENDECTOMY 2011: BREAST BIOPSY; Left     Comment:  pt states had stereo done on mobile unit at Dr.               Dwyane Luo office. DCIS 02/2010: BREAST LUMPECTOMY; Left     Comment:  lumpectomy with re excision 03/2010 of left breast for   cancer and rad tx 02/17/2010;03/10/2010: BREAST SURGERY; Left     Comment:  lumpectomy and repeat wide excision No date: CESAREAN SECTION 06/13/2006: COLON SURGERY; Right     Comment:  Hand-assisted right hemicolectomy. 5.8 cm villous               adenoma without atypia 2015: COLONOSCOPY     Comment:  Normal exam. 06/21/2015: ESOPHAGOGASTRODUODENOSCOPY (EGD) WITH PROPOFOL; N/A     Comment:  Procedure: ESOPHAGOGASTRODUODENOSCOPY (EGD) WITH               PROPOFOL;  Surgeon: Hulen Luster, MD;  Location: [facility]               ENDOSCOPY;  Service: Gastroenterology;  Laterality: N/A; BMI    Body Mass Index: 32.32 kg/m     Reproductive/Obstetrics negative OB ROS                             Anesthesia Physical Anesthesia Plan  ASA: III  Anesthesia Plan: General   Post-op Pain Management:    Induction:   PONV Risk Score and Plan:   Airway Management Planned:   Additional Equipment:   Intra-op Plan:   Post-operative Plan:   Informed Consent: I have reviewed the patients History and Physical, chart, labs and discussed the procedure including  the risks, benefits and alternatives for the proposed anesthesia with the patient or authorized representative who has indicated his/her understanding and acceptance.     Dental Advisory Given  Plan Discussed with: CRNA  Anesthesia Plan Comments:         Anesthesia Quick Evaluation

## 2019-05-29 NOTE — Transfer of Care (Signed)
Immediate Anesthesia Transfer of Care Note  Patient: Jalayshia Espejel  Procedure(s) Performed: COLONOSCOPY WITH PROPOFOL (N/A )  Patient Location: PACU and Endoscopy Unit  Anesthesia Type:General  Level of Consciousness: awake, oriented, drowsy and patient cooperative  Airway & Oxygen Therapy: Patient Spontanous Breathing  Post-op Assessment: Report given to RN and Post -op Vital signs reviewed and stable  Post vital signs: Reviewed and stable  Last Vitals:  Vitals Value Taken Time  BP 124/67 05/29/19 1301  Temp    Pulse 80 05/29/19 1302  Resp 12 05/29/19 1302  SpO2 98 % 05/29/19 1302  Vitals shown include unvalidated device data.  Last Pain:  Vitals:   05/29/19 1301  TempSrc:   PainSc: 0-No pain         Complications: No apparent anesthesia complications

## 2019-05-29 NOTE — Op Note (Signed)
[facility] Patient Name: Stacie Ellis Procedure Date: 05/29/2019 12:26 PM MRN: [id number] Account #: [id number] Date of Birth: May 25, 1955 Admit Type: Outpatient Age: 64 Room: [facility] ENDO ROOM 4 Gender: Female Note Status: Finalized Procedure:             Colonoscopy Indications:           High risk colon cancer surveillance: Personal history                         of colon cancer Providers:             Lucilla Lame MD, MD Medicines:             Propofol per Anesthesia Complications:         No immediate complications. Procedure:             Pre-Anesthesia Assessment:                        - Prior to the procedure, a History and Physical was                         performed, and patient medications and allergies were                         reviewed. The patient's tolerance of previous                         anesthesia was also reviewed. The risks and benefits                         of the procedure and the sedation options and risks                         were discussed with the patient. All questions were                         answered, and informed consent was obtained. Prior                         Anticoagulants: The patient has taken no previous                         anticoagulant or antiplatelet agents. ASA Grade                         Assessment: II - A patient with mild systemic disease.                         After reviewing the risks and benefits, the patient                         was deemed in satisfactory condition to undergo the                         procedure.                        After obtaining informed consent, the colonoscope was  passed under direct vision. Throughout the procedure,                         the patient's blood pressure, pulse, and oxygen                         saturations were monitored continuously. The                         Colonoscope was introduced through the anus  and                         advanced to the the ileocolonic anastomosis. The                         colonoscopy was performed without difficulty. The                         patient tolerated the procedure well. The quality of                         the bowel preparation was excellent. Findings:      The perianal and digital rectal examinations were normal.      There was evidence of a prior end-to-side colo-colonic anastomosis in       the transverse colon. This was patent and was characterized by healthy       appearing mucosa. The anastomosis was traversed.      A 3 mm polyp was found in the descending colon. The polyp was sessile.       The polyp was removed with a cold biopsy forceps. Resection and       retrieval were complete.      A 2 mm polyp was found in the sigmoid colon. The polyp was sessile. The       polyp was removed with a cold biopsy forceps. Resection and retrieval       were complete.      Non-bleeding internal hemorrhoids were found during retroflexion. The       hemorrhoids were Grade II (internal hemorrhoids that prolapse but reduce       spontaneously).      Multiple small-mouthed diverticula were found in the sigmoid colon. Impression:            - Patent end-to-side colo-colonic anastomosis,                         characterized by healthy appearing mucosa.                        - One 3 mm polyp in the descending colon, removed with                         a cold biopsy forceps. Resected and retrieved.                        - One 2 mm polyp in the sigmoid colon, removed with a                         cold biopsy forceps. Resected and retrieved.                        -  Non-bleeding internal hemorrhoids.                        - Diverticulosis in the sigmoid colon. Recommendation:        - Discharge patient to home.                        - Resume previous diet.                        - Continue present medications.                        - Await pathology  results.                        - Repeat colonoscopy in 5 years for surveillance. Procedure Code(s):     --- Professional ---                        [phone], Colonoscopy, flexible; with biopsy, single or                         multiple Diagnosis Code(s):     --- Professional ---                        [id number], Personal history of other malignant neoplasm                         of large intestine                        K63.5, Polyp of colon CPT copyright 2019 American Medical Association. All rights reserved. The codes documented in this report are preliminary and upon coder review may  be revised to meet current compliance requirements. Lucilla Lame MD, MD 05/29/2019 12:58:24 PM This report has been signed electronically. Number of Addenda: 0 Note Initiated On: 05/29/2019 12:26 PM Total Procedure Duration: 0 hours 8 minutes 10 seconds  Estimated Blood Loss:  Estimated blood loss: none.      [facility]

## 2019-05-30 ENCOUNTER — Encounter: Payer: Self-pay | Admitting: *Deleted

## 2019-05-30 LAB — SURGICAL PATHOLOGY

## 2019-06-02 ENCOUNTER — Other Ambulatory Visit: Payer: Self-pay

## 2019-06-02 MED ORDER — TELMISARTAN 20 MG PO TABS: 20.0000 mg | ORAL_TABLET | Freq: Every day | ORAL | 5 refills | Status: DC

## 2019-06-02 NOTE — Anesthesia Postprocedure Evaluation (Signed)
Anesthesia Post Note  Patient: Stacie Ellis  Procedure(s) Performed: COLONOSCOPY WITH PROPOFOL (N/A )  Patient location during evaluation: PACU Anesthesia Type: General Level of consciousness: awake and alert Pain management: pain level controlled Vital Signs Assessment: post-procedure vital signs reviewed and stable Respiratory status: spontaneous breathing, nonlabored ventilation, respiratory function stable and patient connected to nasal cannula oxygen Cardiovascular status: blood pressure returned to baseline and stable Postop Assessment: no apparent nausea or vomiting Anesthetic complications: no     Last Vitals:  Vitals:   05/29/19 1301 05/29/19 1311  BP: 124/67 (!) 149/73  Pulse:    Resp:    Temp: (!) 36.2 C   SpO2:      Last Pain:  Vitals:   05/29/19 1321  TempSrc:   PainSc: 0-No pain                 Molli Barrows

## 2019-06-03 ENCOUNTER — Encounter: Payer: Self-pay | Admitting: Gastroenterology

## 2019-06-05 DIAGNOSIS — J3089 Other allergic rhinitis: Secondary | ICD-10-CM | POA: Diagnosis not present

## 2019-06-05 DIAGNOSIS — J3081 Allergic rhinitis due to animal (cat) (dog) hair and dander: Secondary | ICD-10-CM | POA: Diagnosis not present

## 2019-06-05 DIAGNOSIS — J301 Allergic rhinitis due to pollen: Secondary | ICD-10-CM | POA: Diagnosis not present

## 2019-06-12 DIAGNOSIS — J3089 Other allergic rhinitis: Secondary | ICD-10-CM | POA: Diagnosis not present

## 2019-06-12 DIAGNOSIS — J3081 Allergic rhinitis due to animal (cat) (dog) hair and dander: Secondary | ICD-10-CM | POA: Diagnosis not present

## 2019-06-12 DIAGNOSIS — J301 Allergic rhinitis due to pollen: Secondary | ICD-10-CM | POA: Diagnosis not present

## 2019-06-17 DIAGNOSIS — J301 Allergic rhinitis due to pollen: Secondary | ICD-10-CM | POA: Diagnosis not present

## 2019-06-17 DIAGNOSIS — J3089 Other allergic rhinitis: Secondary | ICD-10-CM | POA: Diagnosis not present

## 2019-06-17 DIAGNOSIS — J3081 Allergic rhinitis due to animal (cat) (dog) hair and dander: Secondary | ICD-10-CM | POA: Diagnosis not present

## 2019-06-20 DIAGNOSIS — J301 Allergic rhinitis due to pollen: Secondary | ICD-10-CM | POA: Diagnosis not present

## 2019-06-20 DIAGNOSIS — J3081 Allergic rhinitis due to animal (cat) (dog) hair and dander: Secondary | ICD-10-CM | POA: Diagnosis not present

## 2019-06-20 DIAGNOSIS — J3089 Other allergic rhinitis: Secondary | ICD-10-CM | POA: Diagnosis not present

## 2019-06-26 DIAGNOSIS — J301 Allergic rhinitis due to pollen: Secondary | ICD-10-CM | POA: Diagnosis not present

## 2019-06-26 DIAGNOSIS — J3081 Allergic rhinitis due to animal (cat) (dog) hair and dander: Secondary | ICD-10-CM | POA: Diagnosis not present

## 2019-06-26 DIAGNOSIS — J3089 Other allergic rhinitis: Secondary | ICD-10-CM | POA: Diagnosis not present

## 2019-07-01 DIAGNOSIS — J301 Allergic rhinitis due to pollen: Secondary | ICD-10-CM | POA: Diagnosis not present

## 2019-07-01 DIAGNOSIS — J3081 Allergic rhinitis due to animal (cat) (dog) hair and dander: Secondary | ICD-10-CM | POA: Diagnosis not present

## 2019-07-01 DIAGNOSIS — J3089 Other allergic rhinitis: Secondary | ICD-10-CM | POA: Diagnosis not present

## 2019-07-08 DIAGNOSIS — J3081 Allergic rhinitis due to animal (cat) (dog) hair and dander: Secondary | ICD-10-CM | POA: Diagnosis not present

## 2019-07-08 DIAGNOSIS — J301 Allergic rhinitis due to pollen: Secondary | ICD-10-CM | POA: Diagnosis not present

## 2019-07-08 DIAGNOSIS — J3089 Other allergic rhinitis: Secondary | ICD-10-CM | POA: Diagnosis not present

## 2019-07-11 DIAGNOSIS — J301 Allergic rhinitis due to pollen: Secondary | ICD-10-CM | POA: Diagnosis not present

## 2019-07-11 DIAGNOSIS — J3089 Other allergic rhinitis: Secondary | ICD-10-CM | POA: Diagnosis not present

## 2019-07-11 DIAGNOSIS — J3081 Allergic rhinitis due to animal (cat) (dog) hair and dander: Secondary | ICD-10-CM | POA: Diagnosis not present

## 2019-07-14 ENCOUNTER — Other Ambulatory Visit: Payer: Self-pay | Admitting: Internal Medicine

## 2019-07-14 DIAGNOSIS — D0512 Intraductal carcinoma in situ of left breast: Secondary | ICD-10-CM

## 2019-07-15 DIAGNOSIS — J3081 Allergic rhinitis due to animal (cat) (dog) hair and dander: Secondary | ICD-10-CM | POA: Diagnosis not present

## 2019-07-15 DIAGNOSIS — J301 Allergic rhinitis due to pollen: Secondary | ICD-10-CM | POA: Diagnosis not present

## 2019-07-15 DIAGNOSIS — J3089 Other allergic rhinitis: Secondary | ICD-10-CM | POA: Diagnosis not present

## 2019-07-22 DIAGNOSIS — J3081 Allergic rhinitis due to animal (cat) (dog) hair and dander: Secondary | ICD-10-CM | POA: Diagnosis not present

## 2019-07-22 DIAGNOSIS — J301 Allergic rhinitis due to pollen: Secondary | ICD-10-CM | POA: Diagnosis not present

## 2019-07-22 DIAGNOSIS — J3089 Other allergic rhinitis: Secondary | ICD-10-CM | POA: Diagnosis not present

## 2019-07-23 ENCOUNTER — Other Ambulatory Visit: Payer: Self-pay | Admitting: Obstetrics and Gynecology

## 2019-07-23 ENCOUNTER — Telehealth: Payer: Self-pay

## 2019-07-23 MED ORDER — NYSTATIN 100000 UNIT/GM EX CREA: 1.0000 "application " | TOPICAL_CREAM | Freq: Two times a day (BID) | CUTANEOUS | 1 refills | Status: DC

## 2019-07-23 NOTE — Telephone Encounter (Signed)
Rx has been sent  

## 2019-07-23 NOTE — Telephone Encounter (Signed)
Spoke w/patient to clarify details of rash. It's in the sweating areas (under breast and c-section scar under her rolls). She has ran out of the previous rx.

## 2019-07-23 NOTE — Telephone Encounter (Signed)
Patient requesting rx for Nystatin for a rash. She had this a few years ago and AMS had recommended this. Pharmacy on file correct.

## 2019-07-23 NOTE — Telephone Encounter (Signed)
Pt aware.

## 2019-07-29 DIAGNOSIS — J301 Allergic rhinitis due to pollen: Secondary | ICD-10-CM | POA: Diagnosis not present

## 2019-07-29 DIAGNOSIS — J3081 Allergic rhinitis due to animal (cat) (dog) hair and dander: Secondary | ICD-10-CM | POA: Diagnosis not present

## 2019-07-29 DIAGNOSIS — J3089 Other allergic rhinitis: Secondary | ICD-10-CM | POA: Diagnosis not present

## 2019-07-30 ENCOUNTER — Ambulatory Visit
Admission: RE | Admit: 2019-07-30 | Discharge: 2019-07-30 | Disposition: A | Discharge status: Home / Self Care | Payer: PPO | Source: Ambulatory Visit | Attending: Obstetrics and Gynecology | Admitting: Obstetrics and Gynecology

## 2019-07-30 ENCOUNTER — Other Ambulatory Visit: Payer: Self-pay

## 2019-07-30 DIAGNOSIS — Z1231 Encounter for screening mammogram for malignant neoplasm of breast: Secondary | ICD-10-CM

## 2019-07-30 DIAGNOSIS — I10 Essential (primary) hypertension: Secondary | ICD-10-CM

## 2019-07-30 MED ORDER — AMLODIPINE BESYLATE 2.5 MG PO TABS: 2.5000 mg | ORAL_TABLET | Freq: Two times a day (BID) | ORAL | 2 refills | Status: DC

## 2019-08-12 ENCOUNTER — Other Ambulatory Visit: Payer: Self-pay

## 2019-08-12 DIAGNOSIS — J3089 Other allergic rhinitis: Secondary | ICD-10-CM | POA: Diagnosis not present

## 2019-08-12 DIAGNOSIS — J301 Allergic rhinitis due to pollen: Secondary | ICD-10-CM | POA: Diagnosis not present

## 2019-08-12 DIAGNOSIS — J3081 Allergic rhinitis due to animal (cat) (dog) hair and dander: Secondary | ICD-10-CM | POA: Diagnosis not present

## 2019-08-12 MED ORDER — PANTOPRAZOLE SODIUM 40 MG PO TBEC: 40.0000 mg | DELAYED_RELEASE_TABLET | Freq: Every day | ORAL | 1 refills | Status: DC

## 2019-08-19 DIAGNOSIS — J3081 Allergic rhinitis due to animal (cat) (dog) hair and dander: Secondary | ICD-10-CM | POA: Diagnosis not present

## 2019-08-19 DIAGNOSIS — J3089 Other allergic rhinitis: Secondary | ICD-10-CM | POA: Diagnosis not present

## 2019-08-19 DIAGNOSIS — J301 Allergic rhinitis due to pollen: Secondary | ICD-10-CM | POA: Diagnosis not present

## 2019-08-26 DIAGNOSIS — J3081 Allergic rhinitis due to animal (cat) (dog) hair and dander: Secondary | ICD-10-CM | POA: Diagnosis not present

## 2019-08-26 DIAGNOSIS — J3089 Other allergic rhinitis: Secondary | ICD-10-CM | POA: Diagnosis not present

## 2019-08-26 DIAGNOSIS — J301 Allergic rhinitis due to pollen: Secondary | ICD-10-CM | POA: Diagnosis not present

## 2019-08-29 ENCOUNTER — Telehealth: Payer: Self-pay

## 2019-08-29 NOTE — Telephone Encounter (Signed)
Lmom to confirm and screen for 09-02-19 ov. 

## 2019-09-02 ENCOUNTER — Ambulatory Visit (INDEPENDENT_AMBULATORY_CARE_PROVIDER_SITE_OTHER): Payer: PPO | Admitting: Nurse Practitioner

## 2019-09-02 ENCOUNTER — Encounter: Payer: Self-pay | Admitting: Nurse Practitioner

## 2019-09-02 ENCOUNTER — Other Ambulatory Visit: Payer: Self-pay

## 2019-09-02 VITALS — BP 143/81 | HR 88 | Temp 97.3°F | Ht 60.0 in | Wt 163.6 lb

## 2019-09-02 DIAGNOSIS — R2243 Localized swelling, mass and lump, lower limb, bilateral: Secondary | ICD-10-CM | POA: Diagnosis not present

## 2019-09-02 DIAGNOSIS — R300 Dysuria: Secondary | ICD-10-CM | POA: Diagnosis not present

## 2019-09-02 DIAGNOSIS — J3081 Allergic rhinitis due to animal (cat) (dog) hair and dander: Secondary | ICD-10-CM | POA: Diagnosis not present

## 2019-09-02 DIAGNOSIS — I10 Essential (primary) hypertension: Secondary | ICD-10-CM

## 2019-09-02 DIAGNOSIS — J301 Allergic rhinitis due to pollen: Secondary | ICD-10-CM | POA: Diagnosis not present

## 2019-09-02 DIAGNOSIS — J3089 Other allergic rhinitis: Secondary | ICD-10-CM | POA: Diagnosis not present

## 2019-09-02 LAB — POCT URINALYSIS DIPSTICK
Blood, UA: NEGATIVE
Glucose, UA: NEGATIVE
Nitrite, UA: NEGATIVE
Protein, UA: NEGATIVE
Spec Grav, UA: 1.025 (ref 1.010–1.025)
Urobilinogen, UA: 0.2 E.U./dL
pH, UA: 6.5 (ref 5.0–8.0)

## 2019-09-02 NOTE — Progress Notes (Signed)
[facility] [place], [place] [address]  Internal MEDICINE  Office Visit Note  Patient Name: Stacie Ellis  [id number]  [id number]  Date of Service: 09/08/2019   Pt is here for a sick visit.  Chief Complaint  Patient presents with   Edema    Swelling in ankles for 3 weeks   Hypertension   Gastroesophageal Reflux   Back Pain    lower back pain      The patient is here for acute visit. She states that she was having moderate swelling in both ankles for about three weeks. She states that swelling was not getting better with rest. Had to wear compression socks during the day and night. She states that swelling was present for about three weeks. States that over the weekend, over the weekend had some abdominal pain and flank pain. Abdominal pain was pretty severe. Lasted for about 3 days then resolved on it's own. She denies any chest pain, chest pressure, or shortness of breath. She denies fever or headache. Currently states that flank pain and abdominal pain have also resolved. Denies nausea, vomiting, or diarrhea.        Current Medication:  Outpatient Encounter Medications as of 09/02/2019  Medication Sig   albuterol (PROVENTIL HFA;VENTOLIN HFA) 108 (90 Base) MCG/ACT inhaler Inhale 2 puffs into the lungs every 6 (six) hours as needed for wheezing or shortness of breath.   amLODipine (NORVASC) 2.5 MG tablet Take 1 tablet (2.5 mg total) by mouth 2 (two) times daily.   aspirin 81 MG tablet Take 81 mg by mouth daily.   budesonide-formoterol (SYMBICORT) 80-4.5 MCG/ACT inhaler Inhale 2 puffs into the lungs 2 (two) times daily.   carvedilol (COREG) 25 MG tablet TAKE 1 TABLET(25 MG) BY MOUTH TWICE DAILY   famotidine (PEPCID) 20 MG tablet Take 1 tablet (20 mg total) by mouth 2 (two) times daily.   hydrochlorothiazide (HYDRODIURIL) 25 MG tablet Take 1 tablet (25 mg total) by mouth every morning.   loratadine (CLARITIN) 10 MG tablet Take 10 mg by  mouth daily.   montelukast (SINGULAIR) 10 MG tablet Take 10 mg by mouth at bedtime.   nystatin cream (MYCOSTATIN) Apply 1 application topically 2 (two) times daily.   pantoprazole (PROTONIX) 40 MG tablet Take 1 tablet (40 mg total) by mouth daily.   telmisartan (MICARDIS) 20 MG tablet Take 1 tablet (20 mg total) by mouth daily.   No facility-administered encounter medications on file as of 09/02/2019.      Medical History: Past Medical History:  Diagnosis Date   Asthma    Breast cancer ([place]) 02/2010   GERD (gastroesophageal reflux disease) 2008   Hypertension    Malignant neoplasm of upper-outer quadrant of female breast ([place]) 2011   Intermediate grade DCIS, ER: 50%; PR 10%. left breast lumpectomy, wide excision and a repeat wide excision on 03/10/2010 for multiple positive margins on original resection   Malignant neoplasm of upper-outer quadrant of female breast [facility]) January 2012:    Re-excision to negative margins.    Obesity, unspecified    Postmenopausal bleeding 2013   S/P radiation therapy 2012   whole breast,    Special screening for malignant neoplasms, colon      Today's Vitals   09/02/19 0910  BP: (!) 143/81  Pulse: 88  Temp: (!) 97.3 F (36.3 C)  SpO2: 96%  Weight: 163 lb 9.6 oz (74.2 kg)  Height: 5' (1.524 m)   Body mass index is 31.95 kg/m.  Review of Systems  Constitutional: Negative for activity change, chills, fatigue and unexpected weight change.  HENT: Negative for congestion, postnasal drip, rhinorrhea, sneezing and sore throat.   Respiratory: Negative for cough, chest tightness and shortness of breath.   Cardiovascular: Positive for leg swelling. Negative for chest pain and palpitations.       Leg swelling was present for several days and has resolved.   Gastrointestinal: Negative for abdominal pain, constipation, diarrhea, nausea and vomiting.  Endocrine: Negative for cold intolerance, heat intolerance, polydipsia and polyuria.   Genitourinary: Positive for frequency and urgency. Negative for dysuria.       Experienced these symptoms for several days over the weekend, but symptoms have resolved.   Musculoskeletal: Negative for arthralgias, back pain, joint swelling and neck pain.  Skin: Negative for rash.  Allergic/Immunologic: Negative for environmental allergies.  Neurological: Negative for dizziness, tremors, numbness and headaches.  Hematological: Negative for adenopathy. Does not bruise/bleed easily.  Psychiatric/Behavioral: Negative for behavioral problems (Depression), sleep disturbance and suicidal ideas. The patient is not nervous/anxious.     Physical Exam Vitals and nursing note reviewed.  Constitutional:      General: She is not in acute distress.    Appearance: Normal appearance. She is well-developed. She is not diaphoretic.  HENT:     Head: Normocephalic and atraumatic.     Nose: Nose normal.     Mouth/Throat:     Pharynx: No oropharyngeal exudate.  Eyes:     Pupils: Pupils are equal, round, and reactive to light.  Neck:     Thyroid: No thyromegaly.     Vascular: No JVD.     Trachea: No tracheal deviation.  Cardiovascular:     Rate and Rhythm: Normal rate and regular rhythm.     Heart sounds: Normal heart sounds. No murmur heard.  No friction rub. No gallop.      Comments: Currently no swelling present in lower legs. Pulses are palpable, bilaterally. Capillary refill is <3 seconds in both feet.  Pulmonary:     Effort: Pulmonary effort is normal. No respiratory distress.     Breath sounds: Normal breath sounds. No wheezing or rales.  Chest:     Chest wall: No tenderness.  Abdominal:     Palpations: Abdomen is soft.  Genitourinary:    Comments: Urine sample positive for trace WBC and moderate protein. Musculoskeletal:        General: Normal range of motion.     Cervical back: Normal range of motion and neck supple.  Lymphadenopathy:     Cervical: No cervical adenopathy.  Skin:     General: Skin is warm and dry.  Neurological:     Mental Status: She is alert and oriented to person, place, and time.     Cranial Nerves: No cranial nerve deficit.  Psychiatric:        Mood and Affect: Mood normal.        Behavior: Behavior normal.        Thought Content: Thought content normal.        Judgment: Judgment normal.    Assessment/Plan: 1. Essential hypertension Blood pressure well managed. Continue medication as prescribed   2. Localized swelling of both lower legs Currently resolved. Advised she wear compression socks on both legs when awake. Monitor closely. Treat as indicated.   3. Dysuria Urine sample positive for trace WBC and moderate protein. Will send urine for culture and sensitivity and treat as indicated.  - POCT Urinalysis Dipstick - CULTURE,  URINE COMPREHENSIVE  General Counseling: keitra carusone understanding of the findings of todays visit and agrees with plan of treatment. I have discussed any further diagnostic evaluation that may be needed or ordered today. We also reviewed her medications today. she has been encouraged to call the office with any questions or concerns that should arise related to todays visit.    Counseling:  This patient was seen by [place] with Dr Lavera Guise as a part of collaborative care agreement  Orders Placed This Encounter  Procedures   CULTURE, URINE COMPREHENSIVE   POCT Urinalysis Dipstick     Time spent: 30 Minutes

## 2019-09-04 ENCOUNTER — Other Ambulatory Visit: Payer: Self-pay | Admitting: Nurse Practitioner

## 2019-09-04 DIAGNOSIS — R7301 Impaired fasting glucose: Secondary | ICD-10-CM | POA: Diagnosis not present

## 2019-09-04 DIAGNOSIS — B1920 Unspecified viral hepatitis C without hepatic coma: Secondary | ICD-10-CM | POA: Diagnosis not present

## 2019-09-04 DIAGNOSIS — E559 Vitamin D deficiency, unspecified: Secondary | ICD-10-CM | POA: Diagnosis not present

## 2019-09-04 DIAGNOSIS — I10 Essential (primary) hypertension: Secondary | ICD-10-CM | POA: Diagnosis not present

## 2019-09-04 DIAGNOSIS — Z0001 Encounter for general adult medical examination with abnormal findings: Secondary | ICD-10-CM | POA: Diagnosis not present

## 2019-09-04 DIAGNOSIS — D509 Iron deficiency anemia, unspecified: Secondary | ICD-10-CM | POA: Diagnosis not present

## 2019-09-05 ENCOUNTER — Telehealth: Payer: Self-pay

## 2019-09-05 LAB — CULTURE, URINE COMPREHENSIVE

## 2019-09-05 NOTE — Progress Notes (Signed)
Can you find out if the patient still feeling OK? Her urine culture showing only normal flora bacteria. If she does not have symptoms of UTI, I would rather not treat with antibiotics. Thanks.

## 2019-09-05 NOTE — Telephone Encounter (Signed)
Spoke with pt she is feeling better we going to hold on antibiotic and advised drinks plenty of water

## 2019-09-05 NOTE — Telephone Encounter (Signed)
-----   Message from Ronnell Freshwater, NP sent at 09/05/2019  8:35 AM EDT ----- Can you find out if the patient still feeling OK? Her urine culture showing only normal flora bacteria. If she does not have symptoms of UTI, I would rather not treat with antibiotics. Thanks.

## 2019-09-08 DIAGNOSIS — R300 Dysuria: Secondary | ICD-10-CM | POA: Insufficient documentation

## 2019-09-08 DIAGNOSIS — R2243 Localized swelling, mass and lump, lower limb, bilateral: Secondary | ICD-10-CM | POA: Insufficient documentation

## 2019-09-09 DIAGNOSIS — J301 Allergic rhinitis due to pollen: Secondary | ICD-10-CM | POA: Diagnosis not present

## 2019-09-09 DIAGNOSIS — J3081 Allergic rhinitis due to animal (cat) (dog) hair and dander: Secondary | ICD-10-CM | POA: Diagnosis not present

## 2019-09-09 DIAGNOSIS — J3089 Other allergic rhinitis: Secondary | ICD-10-CM | POA: Diagnosis not present

## 2019-09-10 ENCOUNTER — Other Ambulatory Visit: Payer: Self-pay

## 2019-09-10 LAB — COMPREHENSIVE METABOLIC PANEL
ALT: 17 IU/L (ref 0–32)
AST: 21 IU/L (ref 0–40)
Albumin/Globulin Ratio: 1.3 (ref 1.2–2.2)
Albumin: 4.1 g/dL (ref 3.8–4.8)
Alkaline Phosphatase: 99 IU/L (ref 48–121)
BUN/Creatinine Ratio: 14 (ref 12–28)
BUN: 10 mg/dL (ref 8–27)
Bilirubin Total: 0.4 mg/dL (ref 0.0–1.2)
CO2: 25 mmol/L (ref 20–29)
Calcium: 9.6 mg/dL (ref 8.7–10.3)
Chloride: 103 mmol/L (ref 96–106)
Creatinine, Ser: 0.69 mg/dL (ref 0.57–1.00)
GFR calc Af Amer: 106 mL/min/{1.73_m2} (ref >59)
GFR calc non Af Amer: 92 mL/min/{1.73_m2} (ref >59)
Globulin, Total: 3.1 g/dL (ref 1.5–4.5)
Glucose: 94 mg/dL (ref 65–99)
Potassium: 4.1 mmol/L (ref 3.5–5.2)
Sodium: 138 mmol/L (ref 134–144)
Total Protein: 7.2 g/dL (ref 6.0–8.5)

## 2019-09-10 LAB — T4, FREE: Free T4: 1.47 ng/dL (ref 0.82–1.77)

## 2019-09-10 LAB — CBC
Hematocrit: 38.8 % (ref 34.0–46.6)
Hemoglobin: 13 g/dL (ref 11.1–15.9)
MCH: 27.8 pg (ref 26.6–33.0)
MCHC: 33.5 g/dL (ref 31.5–35.7)
MCV: 83 fL (ref 79–97)
Platelets: 281 10*3/uL (ref 150–450)
RBC: 4.68 x10E6/uL (ref 3.77–5.28)
RDW: 13.5 % (ref 11.7–15.4)
WBC: 4.2 10*3/uL (ref 3.4–10.8)

## 2019-09-10 LAB — SPECIMEN STATUS REPORT

## 2019-09-10 LAB — LIPID PANEL WITH LDL/HDL RATIO
Cholesterol, Total: 220 mg/dL — ABNORMAL HIGH (ref 100–199)
HDL: 54 mg/dL (ref >39)
LDL Chol Calc (NIH): 145 mg/dL — ABNORMAL HIGH (ref 0–99)
LDL/HDL Ratio: 2.7 ratio (ref 0.0–3.2)
Triglycerides: 116 mg/dL (ref 0–149)
VLDL Cholesterol Cal: 21 mg/dL (ref 5–40)

## 2019-09-10 LAB — TSH: TSH: 1.85 u[IU]/mL (ref 0.450–4.500)

## 2019-09-10 LAB — IRON AND TIBC
Iron Saturation: 14 % — ABNORMAL LOW (ref 15–55)
Iron: 57 ug/dL (ref 27–139)
Total Iron Binding Capacity: 406 ug/dL (ref 250–450)
UIBC: 349 ug/dL (ref 118–369)

## 2019-09-10 LAB — B12 AND FOLATE PANEL
Folate: 12.2 ng/mL (ref >3.0)
Vitamin B-12: 1395 pg/mL — ABNORMAL HIGH (ref 232–1245)

## 2019-09-10 LAB — VITAMIN D 25 HYDROXY (VIT D DEFICIENCY, FRACTURES): Vit D, 25-Hydroxy: 72.3 ng/mL (ref 30.0–100.0)

## 2019-09-10 LAB — FERRITIN: Ferritin: 31 ng/mL (ref 15–150)

## 2019-09-10 LAB — HGB A1C W/O EAG: Hgb A1c MFr Bld: 5.9 % — ABNORMAL HIGH (ref 4.8–5.6)

## 2019-09-10 NOTE — Progress Notes (Signed)
Overall, labs good. Discuss at visit 10/06/2019

## 2019-09-11 LAB — HEPATITIS C ANTIBODY: Hep C Virus Ab: <0.1 s/co ratio (ref 0.0–0.9)

## 2019-09-11 LAB — SPECIMEN STATUS REPORT

## 2019-09-16 DIAGNOSIS — J301 Allergic rhinitis due to pollen: Secondary | ICD-10-CM | POA: Diagnosis not present

## 2019-09-16 DIAGNOSIS — J3089 Other allergic rhinitis: Secondary | ICD-10-CM | POA: Diagnosis not present

## 2019-09-16 DIAGNOSIS — J3081 Allergic rhinitis due to animal (cat) (dog) hair and dander: Secondary | ICD-10-CM | POA: Diagnosis not present

## 2019-09-23 DIAGNOSIS — J3089 Other allergic rhinitis: Secondary | ICD-10-CM | POA: Diagnosis not present

## 2019-09-23 DIAGNOSIS — J301 Allergic rhinitis due to pollen: Secondary | ICD-10-CM | POA: Diagnosis not present

## 2019-09-23 DIAGNOSIS — J3081 Allergic rhinitis due to animal (cat) (dog) hair and dander: Secondary | ICD-10-CM | POA: Diagnosis not present

## 2019-09-29 ENCOUNTER — Other Ambulatory Visit: Payer: Self-pay

## 2019-09-29 DIAGNOSIS — D0512 Intraductal carcinoma in situ of left breast: Secondary | ICD-10-CM

## 2019-09-29 MED ORDER — HYDROCHLOROTHIAZIDE 25 MG PO TABS: 25.0000 mg | ORAL_TABLET | Freq: Every morning | ORAL | 5 refills | Status: DC

## 2019-09-30 DIAGNOSIS — J301 Allergic rhinitis due to pollen: Secondary | ICD-10-CM | POA: Diagnosis not present

## 2019-09-30 DIAGNOSIS — J3089 Other allergic rhinitis: Secondary | ICD-10-CM | POA: Diagnosis not present

## 2019-09-30 DIAGNOSIS — J3081 Allergic rhinitis due to animal (cat) (dog) hair and dander: Secondary | ICD-10-CM | POA: Diagnosis not present

## 2019-09-30 DIAGNOSIS — J4530 Mild persistent asthma, uncomplicated: Secondary | ICD-10-CM | POA: Diagnosis not present

## 2019-10-02 ENCOUNTER — Telehealth: Payer: Self-pay

## 2019-10-02 NOTE — Telephone Encounter (Signed)
Confirmed and screened for 10-06-19 ov. °

## 2019-10-06 ENCOUNTER — Ambulatory Visit: Payer: PPO | Admitting: Nurse Practitioner

## 2019-10-14 DIAGNOSIS — J3081 Allergic rhinitis due to animal (cat) (dog) hair and dander: Secondary | ICD-10-CM | POA: Diagnosis not present

## 2019-10-14 DIAGNOSIS — J301 Allergic rhinitis due to pollen: Secondary | ICD-10-CM | POA: Diagnosis not present

## 2019-10-14 DIAGNOSIS — J3089 Other allergic rhinitis: Secondary | ICD-10-CM | POA: Diagnosis not present

## 2019-10-21 DIAGNOSIS — J3081 Allergic rhinitis due to animal (cat) (dog) hair and dander: Secondary | ICD-10-CM | POA: Diagnosis not present

## 2019-10-21 DIAGNOSIS — J301 Allergic rhinitis due to pollen: Secondary | ICD-10-CM | POA: Diagnosis not present

## 2019-10-21 DIAGNOSIS — J3089 Other allergic rhinitis: Secondary | ICD-10-CM | POA: Diagnosis not present

## 2019-10-24 ENCOUNTER — Other Ambulatory Visit: Payer: Self-pay

## 2019-10-24 DIAGNOSIS — D0512 Intraductal carcinoma in situ of left breast: Secondary | ICD-10-CM

## 2019-10-24 MED ORDER — CARVEDILOL 25 MG PO TABS: 25.0000 mg | ORAL_TABLET | Freq: Two times a day (BID) | ORAL | 2 refills | Status: DC

## 2019-10-28 ENCOUNTER — Other Ambulatory Visit: Payer: Self-pay

## 2019-10-28 DIAGNOSIS — J301 Allergic rhinitis due to pollen: Secondary | ICD-10-CM | POA: Diagnosis not present

## 2019-10-28 DIAGNOSIS — I10 Essential (primary) hypertension: Secondary | ICD-10-CM

## 2019-10-28 DIAGNOSIS — J3089 Other allergic rhinitis: Secondary | ICD-10-CM | POA: Diagnosis not present

## 2019-10-28 DIAGNOSIS — J3081 Allergic rhinitis due to animal (cat) (dog) hair and dander: Secondary | ICD-10-CM | POA: Diagnosis not present

## 2019-10-28 MED ORDER — AMLODIPINE BESYLATE 2.5 MG PO TABS: 2.5000 mg | ORAL_TABLET | Freq: Two times a day (BID) | ORAL | 2 refills | Status: DC

## 2019-10-31 DIAGNOSIS — J3081 Allergic rhinitis due to animal (cat) (dog) hair and dander: Secondary | ICD-10-CM | POA: Diagnosis not present

## 2019-10-31 DIAGNOSIS — J301 Allergic rhinitis due to pollen: Secondary | ICD-10-CM | POA: Diagnosis not present

## 2019-11-04 DIAGNOSIS — J3081 Allergic rhinitis due to animal (cat) (dog) hair and dander: Secondary | ICD-10-CM | POA: Diagnosis not present

## 2019-11-04 DIAGNOSIS — J3089 Other allergic rhinitis: Secondary | ICD-10-CM | POA: Diagnosis not present

## 2019-11-04 DIAGNOSIS — J301 Allergic rhinitis due to pollen: Secondary | ICD-10-CM | POA: Diagnosis not present

## 2019-11-18 DIAGNOSIS — J3089 Other allergic rhinitis: Secondary | ICD-10-CM | POA: Diagnosis not present

## 2019-11-18 DIAGNOSIS — J3081 Allergic rhinitis due to animal (cat) (dog) hair and dander: Secondary | ICD-10-CM | POA: Diagnosis not present

## 2019-11-18 DIAGNOSIS — J301 Allergic rhinitis due to pollen: Secondary | ICD-10-CM | POA: Diagnosis not present

## 2019-11-25 DIAGNOSIS — J3081 Allergic rhinitis due to animal (cat) (dog) hair and dander: Secondary | ICD-10-CM | POA: Diagnosis not present

## 2019-11-25 DIAGNOSIS — J301 Allergic rhinitis due to pollen: Secondary | ICD-10-CM | POA: Diagnosis not present

## 2019-11-25 DIAGNOSIS — J3089 Other allergic rhinitis: Secondary | ICD-10-CM | POA: Diagnosis not present

## 2019-12-02 DIAGNOSIS — J3089 Other allergic rhinitis: Secondary | ICD-10-CM | POA: Diagnosis not present

## 2019-12-02 DIAGNOSIS — J301 Allergic rhinitis due to pollen: Secondary | ICD-10-CM | POA: Diagnosis not present

## 2019-12-02 DIAGNOSIS — J3081 Allergic rhinitis due to animal (cat) (dog) hair and dander: Secondary | ICD-10-CM | POA: Diagnosis not present

## 2019-12-08 ENCOUNTER — Ambulatory Visit (INDEPENDENT_AMBULATORY_CARE_PROVIDER_SITE_OTHER): Payer: PPO | Admitting: Hospice and Palliative Medicine

## 2019-12-08 ENCOUNTER — Encounter: Payer: Self-pay | Admitting: Hospice and Palliative Medicine

## 2019-12-08 ENCOUNTER — Other Ambulatory Visit: Payer: Self-pay

## 2019-12-08 DIAGNOSIS — R7303 Prediabetes: Secondary | ICD-10-CM

## 2019-12-08 DIAGNOSIS — G8929 Other chronic pain: Secondary | ICD-10-CM | POA: Diagnosis not present

## 2019-12-08 DIAGNOSIS — J301 Allergic rhinitis due to pollen: Secondary | ICD-10-CM

## 2019-12-08 DIAGNOSIS — M5442 Lumbago with sciatica, left side: Secondary | ICD-10-CM | POA: Diagnosis not present

## 2019-12-08 DIAGNOSIS — M5441 Lumbago with sciatica, right side: Secondary | ICD-10-CM

## 2019-12-08 DIAGNOSIS — R300 Dysuria: Secondary | ICD-10-CM

## 2019-12-08 DIAGNOSIS — Z0001 Encounter for general adult medical examination with abnormal findings: Secondary | ICD-10-CM

## 2019-12-08 DIAGNOSIS — I10 Essential (primary) hypertension: Secondary | ICD-10-CM | POA: Diagnosis not present

## 2019-12-08 DIAGNOSIS — E782 Mixed hyperlipidemia: Secondary | ICD-10-CM

## 2019-12-08 NOTE — Progress Notes (Signed)
[facility] [place], [place] [address]  Internal MEDICINE  Office Visit Note  Patient Name: Stacie Ellis  [id number]  [id number]  Date of Service: 12/11/2019  Chief Complaint  Patient presents with  . Medicare Wellness  . Gastroesophageal Reflux  . Hypertension  . Quality Metric Gaps    flu,tetnaus,covid     HPI Pt is here for routine health maintenance examination She says overall she has been doing good Was recently seen for bilateral lower leg swelling-resolved Has been having some low back pain- has been seeing the chiropractor and pain has improved Up dated on mammogram as well as colonoscopies Bone density ordered by GYN, plans to have this done in the near future  Reviewed her labs with her -Elevated cholesterol and LDL levels -Elevated A1C  She continues to have routine allergy injections given by allergist, Dr. Tally Due complications to report  Current Medication: Outpatient Encounter Medications as of 12/08/2019  Medication Sig  . albuterol (PROVENTIL HFA;VENTOLIN HFA) 108 (90 Base) MCG/ACT inhaler Inhale 2 puffs into the lungs every 6 (six) hours as needed for wheezing or shortness of breath.  Marland Kitchen amLODipine (NORVASC) 2.5 MG tablet Take 1 tablet (2.5 mg total) by mouth 2 (two) times daily.  Marland Kitchen aspirin 81 MG tablet Take 81 mg by mouth daily.  . budesonide-formoterol (SYMBICORT) 80-4.5 MCG/ACT inhaler Inhale 2 puffs into the lungs 2 (two) times daily.  . carvedilol (COREG) 25 MG tablet Take 1 tablet (25 mg total) by mouth 2 (two) times daily with a meal.  . famotidine (PEPCID) 20 MG tablet Take 1 tablet (20 mg total) by mouth 2 (two) times daily.  . hydrochlorothiazide (HYDRODIURIL) 25 MG tablet Take 1 tablet (25 mg total) by mouth every morning.  . loratadine (CLARITIN) 10 MG tablet Take 10 mg by mouth daily.  . montelukast (SINGULAIR) 10 MG tablet Take 10 mg by mouth at bedtime.  Marland Kitchen nystatin cream (MYCOSTATIN) Apply 1  application topically 2 (two) times daily.  . pantoprazole (PROTONIX) 40 MG tablet Take 1 tablet (40 mg total) by mouth daily.  Marland Kitchen telmisartan (MICARDIS) 20 MG tablet Take 1 tablet (20 mg total) by mouth daily.   No facility-administered encounter medications on file as of 12/08/2019.    Surgical History: Past Surgical History:  Procedure Laterality Date  . APPENDECTOMY  2008  . BREAST BIOPSY Left 2011   pt states had stereo done on mobile unit at Dr. Dwyane Luo office. DCIS  . BREAST LUMPECTOMY Left 02/2010   lumpectomy with re excision 03/2010 of left breast for cancer and rad tx  . BREAST SURGERY Left 02/17/2010;03/10/2010   lumpectomy and repeat wide excision  . CESAREAN SECTION    . COLON SURGERY Right 06/13/2006   Hand-assisted right hemicolectomy. 5.8 cm villous adenoma without atypia  . COLONOSCOPY  2015   Normal exam.  . COLONOSCOPY WITH PROPOFOL N/A 05/29/2019   Procedure: COLONOSCOPY WITH PROPOFOL;  Surgeon: Lucilla Lame, MD;  Location: [facility] ENDOSCOPY;  Service: Endoscopy;  Laterality: N/A;  . ESOPHAGOGASTRODUODENOSCOPY (EGD) WITH PROPOFOL N/A 06/21/2015   Procedure: ESOPHAGOGASTRODUODENOSCOPY (EGD) WITH PROPOFOL;  Surgeon: Hulen Luster, MD;  Location: [facility] ENDOSCOPY;  Service: Gastroenterology;  Laterality: N/A;    Medical History: Past Medical History:  Diagnosis Date  . Asthma   . Breast cancer ([place]) 02/2010  . GERD (gastroesophageal reflux disease) 2008  . Hypertension   . Malignant neoplasm of upper-outer quadrant of female breast ([place]) 2011   Intermediate grade DCIS, ER:  50%; PR 10%. left breast lumpectomy, wide excision and a repeat wide excision on 03/10/2010 for multiple positive margins on original resection  . Malignant neoplasm of upper-outer quadrant of female breast [facility]) January 2012:    Re-excision to negative margins.   . Obesity, unspecified   . Postmenopausal bleeding 2013  . S/P radiation therapy 2012   whole breast,   . Special screening for malignant  neoplasms, colon     Family History: Family History  Problem Relation Age of Onset  . Cancer Father        colon  . Cancer Other        unknown family member with breast cancer  . Colon polyps Other        unknown family member with colon polyps    Review of Systems  Constitutional: Negative for chills, diaphoresis and fatigue.  HENT: Negative for ear pain, postnasal drip and sinus pressure.   Eyes: Negative for photophobia, discharge, redness, itching and visual disturbance.  Respiratory: Negative for cough, shortness of breath and wheezing.   Cardiovascular: Negative for chest pain, palpitations and leg swelling.  Gastrointestinal: Negative for abdominal pain, constipation, diarrhea, nausea and vomiting.  Genitourinary: Negative for dysuria and flank pain.  Musculoskeletal: Negative for arthralgias, back pain, gait problem and neck pain.  Skin: Negative for color change.  Allergic/Immunologic: Negative for environmental allergies and food allergies.  Neurological: Negative for dizziness and headaches.  Hematological: Does not bruise/bleed easily.  Psychiatric/Behavioral: Negative for agitation, behavioral problems (depression) and hallucinations.     Vital Signs: BP 132/74   Pulse 81   Temp 98 F (36.7 C)   Resp 16   Ht 5' (1.524 m)   Wt 161 lb 9.6 oz (73.3 kg)   SpO2 96%   BMI 31.56 kg/m    Physical Exam Vitals reviewed.  Constitutional:      Appearance: Normal appearance.  HENT:     Nose: Nose normal.     Mouth/Throat:     Mouth: Mucous membranes are moist.     Pharynx: Oropharynx is clear.  Cardiovascular:     Rate and Rhythm: Normal rate and regular rhythm.     Pulses: Normal pulses.     Heart sounds: Normal heart sounds.  Pulmonary:     Effort: Pulmonary effort is normal.     Breath sounds: Normal breath sounds.  Chest:     Breasts:        Right: Normal.        Left: Normal.  Abdominal:     General: Abdomen is flat.     Palpations: Abdomen is  soft.  Musculoskeletal:        General: Normal range of motion.     Cervical back: Normal range of motion.  Skin:    General: Skin is warm.  Neurological:     General: No focal deficit present.     Mental Status: She is alert and oriented to person, place, and time. Mental status is at baseline.  Psychiatric:        Mood and Affect: Mood normal.        Behavior: Behavior normal.        Thought Content: Thought content normal.    Assessment/Plan: 1. Encounter for general adult medical examination with abnormal findings Well appearing 64 year old female, up to date on mammogram as well as colonoscopy, bone density scan to be done in near future  2. Mixed hyperlipidemia Recent labs revealed elevated total cholesterol as  well as elevated LDL, discussed statin therapy with her and the risk associated with hyperlipidemia such as CVA and MI. She does not want to start on statin therapy at this time Due to longstanding HTN, will obtain baseline carotid US, discussed based on these results it may warrant further treatment - VAS US CAROTID; Future  3. Chronic bilateral low back pain with bilateral sciatica Pain is being improved with chiropractic therapy, continue at this time, not wanting to be referred to specialist at this time, will continue monitoring.  4. Allergic rhinitis due to pollen, unspecified seasonality Continue with allergy injections and other current therapies, symptoms remain stable at this time  5. Essential hypertension BP and HR stable at this time -Had recent issue with ankle swelling, if this reoccurs may need to consider discontinuing amlodipine and adjusting therapy  6. Pre-diabetes A1C on recent lab work 5.9, has remained stable, recheck A1C, consider Ozempic to also assist with weight loss   General Counseling: damiyah ditmars understanding of the findings of todays visit and agrees with plan of treatment. I have discussed any further diagnostic evaluation  that may be needed or ordered today. We also reviewed her medications today. she has been encouraged to call the office with any questions or concerns that should arise related to todays visit.    Counseling: Hypertension Counseling:   The following hypertensive lifestyle modification were recommended and discussed:  1. Limiting alcohol intake to less than 1 oz/day of ethanol:(24 oz of beer or 8 oz of wine or 2 oz of 100-proof whiskey). 2. Take baby ASA 81 mg daily. 3. Importance of regular aerobic exercise and losing weight. 4. Reduce dietary saturated fat and cholesterol intake for overall cardiovascular health. 5. Maintaining adequate dietary potassium, calcium, and magnesium intake. 6. Regular monitoring of the blood pressure. 7. Reduce sodium intake to less than 100 mmol/day (less than 2.3 gm of sodium or less than 6 gm of sodium choride)    Orders Placed This Encounter  Procedures  . VAS US CAROTID      Total time spent: 30 Minutes  Time spent includes review of chart, medications, test results, and follow up plan with the patient.   This patient was seen by Casey Burkitt AGNP-C Collaboration with Dr Lavera Guise as a part of collaborative care agreement   Tanna Furry. [facility] Internal Medicine

## 2019-12-09 DIAGNOSIS — J301 Allergic rhinitis due to pollen: Secondary | ICD-10-CM | POA: Diagnosis not present

## 2019-12-09 DIAGNOSIS — J3089 Other allergic rhinitis: Secondary | ICD-10-CM | POA: Diagnosis not present

## 2019-12-09 DIAGNOSIS — J3081 Allergic rhinitis due to animal (cat) (dog) hair and dander: Secondary | ICD-10-CM | POA: Diagnosis not present

## 2019-12-11 ENCOUNTER — Encounter: Payer: Self-pay | Admitting: Hospice and Palliative Medicine

## 2019-12-12 ENCOUNTER — Other Ambulatory Visit: Payer: Self-pay

## 2019-12-12 MED ORDER — MONTELUKAST SODIUM 10 MG PO TABS: 10.0000 mg | ORAL_TABLET | Freq: Every day | ORAL | 5 refills | Status: DC

## 2019-12-12 MED ORDER — TELMISARTAN 20 MG PO TABS: 20.0000 mg | ORAL_TABLET | Freq: Every day | ORAL | 5 refills | Status: DC

## 2019-12-12 MED ORDER — PANTOPRAZOLE SODIUM 40 MG PO TBEC: 40.0000 mg | DELAYED_RELEASE_TABLET | Freq: Every day | ORAL | 3 refills | Status: DC

## 2019-12-16 DIAGNOSIS — J3081 Allergic rhinitis due to animal (cat) (dog) hair and dander: Secondary | ICD-10-CM | POA: Diagnosis not present

## 2019-12-16 DIAGNOSIS — J301 Allergic rhinitis due to pollen: Secondary | ICD-10-CM | POA: Diagnosis not present

## 2019-12-16 DIAGNOSIS — J3089 Other allergic rhinitis: Secondary | ICD-10-CM | POA: Diagnosis not present

## 2019-12-23 DIAGNOSIS — J301 Allergic rhinitis due to pollen: Secondary | ICD-10-CM | POA: Diagnosis not present

## 2019-12-23 DIAGNOSIS — J3089 Other allergic rhinitis: Secondary | ICD-10-CM | POA: Diagnosis not present

## 2019-12-23 DIAGNOSIS — J3081 Allergic rhinitis due to animal (cat) (dog) hair and dander: Secondary | ICD-10-CM | POA: Diagnosis not present

## 2019-12-24 ENCOUNTER — Ambulatory Visit: Payer: PPO

## 2019-12-24 ENCOUNTER — Ambulatory Visit (INDEPENDENT_AMBULATORY_CARE_PROVIDER_SITE_OTHER): Payer: PPO

## 2019-12-24 ENCOUNTER — Other Ambulatory Visit: Payer: Self-pay

## 2019-12-24 DIAGNOSIS — R7301 Impaired fasting glucose: Secondary | ICD-10-CM | POA: Diagnosis not present

## 2019-12-24 DIAGNOSIS — E782 Mixed hyperlipidemia: Secondary | ICD-10-CM

## 2019-12-24 DIAGNOSIS — I6523 Occlusion and stenosis of bilateral carotid arteries: Secondary | ICD-10-CM

## 2019-12-24 LAB — POCT GLYCOSYLATED HEMOGLOBIN (HGB A1C): Hemoglobin A1C: 5.8 % — AB (ref 4.0–5.6)

## 2019-12-24 NOTE — Progress Notes (Signed)
Pt  A1c 5.8 as per taylor advised her lab good discuss in detailed at next visit

## 2019-12-30 DIAGNOSIS — J3089 Other allergic rhinitis: Secondary | ICD-10-CM | POA: Diagnosis not present

## 2019-12-30 DIAGNOSIS — J3081 Allergic rhinitis due to animal (cat) (dog) hair and dander: Secondary | ICD-10-CM | POA: Diagnosis not present

## 2019-12-30 DIAGNOSIS — J301 Allergic rhinitis due to pollen: Secondary | ICD-10-CM | POA: Diagnosis not present

## 2019-12-31 ENCOUNTER — Ambulatory Visit (INDEPENDENT_AMBULATORY_CARE_PROVIDER_SITE_OTHER): Payer: PPO | Admitting: Hospice and Palliative Medicine

## 2019-12-31 ENCOUNTER — Other Ambulatory Visit: Payer: Self-pay

## 2019-12-31 ENCOUNTER — Encounter: Payer: Self-pay | Admitting: Hospice and Palliative Medicine

## 2019-12-31 DIAGNOSIS — R7303 Prediabetes: Secondary | ICD-10-CM

## 2019-12-31 DIAGNOSIS — I10 Essential (primary) hypertension: Secondary | ICD-10-CM | POA: Diagnosis not present

## 2019-12-31 DIAGNOSIS — E782 Mixed hyperlipidemia: Secondary | ICD-10-CM

## 2019-12-31 NOTE — Progress Notes (Signed)
[facility], [place] [address]  Internal MEDICINE  Office Visit Note  Patient Name: Stacie Ellis  [id number]  [id number]  Date of Service: 01/01/2020  Chief Complaint  Patient presents with   Follow-up    review [place]   Hypertension   Asthma   Quality Metric Gaps    PNA, dexa   policy update form    HPI Patient is here for routine follow-up Reviewed her carotid US--official reading not completed yet, preliminary report reveals mild plaque formation in right and left bilateral carotids At this time she is still continuing to focus on her diet and increasing her activity level to lower her cholesterol levels--would like to avoid medication DM-Also working on her diet and exercise to keep her glucose levels well managed without medication therapy at this time  Current Medication: Outpatient Encounter Medications as of 12/31/2019  Medication Sig   albuterol (PROVENTIL HFA;VENTOLIN HFA) 108 (90 Base) MCG/ACT inhaler Inhale 2 puffs into the lungs every 6 (six) hours as needed for wheezing or shortness of breath.   amLODipine (NORVASC) 2.5 MG tablet Take 1 tablet (2.5 mg total) by mouth 2 (two) times daily.   aspirin 81 MG tablet Take 81 mg by mouth daily.   budesonide-formoterol (SYMBICORT) 80-4.5 MCG/ACT inhaler Inhale 2 puffs into the lungs 2 (two) times daily.   carvedilol (COREG) 25 MG tablet Take 1 tablet (25 mg total) by mouth 2 (two) times daily with a meal.   hydrochlorothiazide (HYDRODIURIL) 25 MG tablet Take 1 tablet (25 mg total) by mouth every morning.   loratadine (CLARITIN) 10 MG tablet Take 10 mg by mouth daily.   montelukast (SINGULAIR) 10 MG tablet Take 1 tablet (10 mg total) by mouth at bedtime.   pantoprazole (PROTONIX) 40 MG tablet Take 1 tablet (40 mg total) by mouth daily.   telmisartan (MICARDIS) 20 MG tablet Take 1 tablet (20 mg total) by mouth daily.   [DISCONTINUED] famotidine (PEPCID) 20 MG tablet Take 1  tablet (20 mg total) by mouth 2 (two) times daily.   [DISCONTINUED] nystatin cream (MYCOSTATIN) Apply 1 application topically 2 (two) times daily.   No facility-administered encounter medications on file as of 12/31/2019.    Surgical History: Past Surgical History:  Procedure Laterality Date   APPENDECTOMY  2008   BREAST BIOPSY Left 2011   pt states had stereo done on mobile unit at Dr. Dwyane Luo office. DCIS   BREAST LUMPECTOMY Left 02/2010   lumpectomy with re excision 03/2010 of left breast for cancer and rad tx   BREAST SURGERY Left 02/17/2010;03/10/2010   lumpectomy and repeat wide excision   CESAREAN SECTION     COLON SURGERY Right 06/13/2006   Hand-assisted right hemicolectomy. 5.8 cm villous adenoma without atypia   COLONOSCOPY  2015   Normal exam.   COLONOSCOPY WITH PROPOFOL N/A 05/29/2019   Procedure: COLONOSCOPY WITH PROPOFOL;  Surgeon: Lucilla Lame, MD;  Location: [facility] ENDOSCOPY;  Service: Endoscopy;  Laterality: N/A;   ESOPHAGOGASTRODUODENOSCOPY (EGD) WITH PROPOFOL N/A 06/21/2015   Procedure: ESOPHAGOGASTRODUODENOSCOPY (EGD) WITH PROPOFOL;  Surgeon: Hulen Luster, MD;  Location: [facility] ENDOSCOPY;  Service: Gastroenterology;  Laterality: N/A;    Medical History: Past Medical History:  Diagnosis Date   Asthma    Breast cancer ([place]) 02/2010   GERD (gastroesophageal reflux disease) 2008   Hypertension    Malignant neoplasm of upper-outer quadrant of female breast ([place]) 2011   Intermediate grade DCIS, ER: 50%; PR 10%. left breast lumpectomy, wide  excision and a repeat wide excision on 03/10/2010 for multiple positive margins on original resection   Malignant neoplasm of upper-outer quadrant of female breast [facility]) January 2012:    Re-excision to negative margins.    Obesity, unspecified    Postmenopausal bleeding 2013   S/P radiation therapy 2012   whole breast,    Special screening for malignant neoplasms, colon     Family History: Family History  Problem  Relation Age of Onset   Cancer Father        colon   Cancer Other        unknown family member with breast cancer   Colon polyps Other        unknown family member with colon polyps    Social History   Socioeconomic History   Marital status: Married    Spouse name: Not on file   Number of children: Not on file   Years of education: Not on file   Highest education level: Not on file  Occupational History   Not on file  Tobacco Use   Smoking status: Never Smoker   Smokeless tobacco: Never Used  Vaping Use   Vaping Use: Never used  Substance and Sexual Activity   Alcohol use: No   Drug use: No   Sexual activity: Yes    Birth control/protection: Post-menopausal  Other Topics Concern   Not on file  Social History Narrative   Not on file   Social Determinants of Health   Financial Resource Strain:    Difficulty of Paying Living Expenses: Not on file  Food Insecurity:    Worried About Running Out of Food in the Last Year: Not on file   [employer] of Food in the Last Year: Not on file  Transportation Needs:    Lack of Transportation (Medical): Not on file   Lack of Transportation (Non-Medical): Not on file  Physical Activity:    Days of Exercise per Week: Not on file   Minutes of Exercise per Session: Not on file  Stress:    Feeling of Stress : Not on file  Social Connections:    Frequency of Communication with Friends and Family: Not on file   Frequency of Social Gatherings with Friends and Family: Not on file   Attends Religious Services: Not on file   Active Member of Clubs or Organizations: Not on file   Attends Archivist Meetings: Not on file   Marital Status: Not on file  Intimate Partner Violence:    Fear of Current or Ex-Partner: Not on file   Emotionally Abused: Not on file   Physically Abused: Not on file   Sexually Abused: Not on file   Review of Systems  Constitutional: Negative for chills, diaphoresis and  fatigue.  HENT: Negative for ear pain, postnasal drip and sinus pressure.   Eyes: Negative for photophobia, discharge, redness, itching and visual disturbance.  Respiratory: Negative for cough, shortness of breath and wheezing.   Cardiovascular: Negative for chest pain, palpitations and leg swelling.  Gastrointestinal: Negative for abdominal pain, constipation, diarrhea, nausea and vomiting.  Genitourinary: Negative for dysuria and flank pain.  Musculoskeletal: Negative for arthralgias, back pain, gait problem and neck pain.  Skin: Negative for color change.  Allergic/Immunologic: Negative for environmental allergies and food allergies.  Neurological: Negative for dizziness and headaches.  Hematological: Does not bruise/bleed easily.  Psychiatric/Behavioral: Negative for agitation, behavioral problems (depression) and hallucinations.    Vital Signs: BP 110/70    Pulse  80    Temp 97.8 F (36.6 C)    Resp 16    Ht 5' (1.524 m)    Wt 164 lb (74.4 kg)    SpO2 96%    BMI 32.03 kg/m    Physical Exam Vitals reviewed.  Constitutional:      Appearance: Normal appearance.  Cardiovascular:     Rate and Rhythm: Normal rate and regular rhythm.     Pulses: Normal pulses.     Heart sounds: Normal heart sounds.  Pulmonary:     Effort: Pulmonary effort is normal.     Breath sounds: Normal breath sounds.  Abdominal:     General: Abdomen is flat.     Palpations: Abdomen is soft.  Musculoskeletal:        General: Normal range of motion.     Cervical back: Normal range of motion.  Skin:    General: Skin is warm.  Neurological:     General: No focal deficit present.     Mental Status: She is alert and oriented to person, place, and time. Mental status is at baseline.  Psychiatric:        Mood and Affect: Mood normal.        Behavior: Behavior normal.        Thought Content: Thought content normal.        Judgment: Judgment normal.    Assessment/Plan: 1. Pre-diabetes A1C 5.8, continues  to be well controlled at this time Discussed medication options to help manage glucose levels as she is high risk for developing diabetes, at this time she is not interested in medication therapy and would like to continue to focusing on lifestyle modifications  2. Mixed hyperlipidemia Carotid US shows mild plaque formation--again she would like to continue to work on lifestyle modifications to work on managing lipid levels and prevention further plaque formation  3. Essential hypertension BP and HR well controlled today on current therapy, will continue with routine monitoring  General Counseling: raliyah montella understanding of the findings of todays visit and agrees with plan of treatment. I have discussed any further diagnostic evaluation that may be needed or ordered today. We also reviewed her medications today. she has been encouraged to call the office with any questions or concerns that should arise related to todays visit.   Time spent: 30 Minutes Time spent includes review of chart, medications, test results and follow-up plan with the patient.  This patient was seen by Theodoro Grist AGNP-C in Collaboration with Dr Lavera Guise as a part of collaborative care agreement     Tanna Furry. Donnesha Karg AGNP-C Internal medicine

## 2020-01-01 ENCOUNTER — Encounter: Payer: Self-pay | Admitting: Hospice and Palliative Medicine

## 2020-01-04 NOTE — Procedures (Signed)
[place], [place] [address]  DATE OF SERVICE: 12/24/2019  CAROTID DOPPLER INTERPRETATION:  Bilateral Carotid Ultrsasound and Color Doppler Examination was performed. The RIGHT CCA shows minimal plaque in the vessel. The LEFT CCA shows minimal plaque in the vessel. There was no intimal thickening noted in the RIGHT carotid artery. There was no intimal thickening in the LEFT carotid artery.  The RIGHT CCA shows peak systolic velocity of 71GG per second. The end diastolic velocity is 26RS per second on the RIGHT side. The RIGHT ICA shows peak systolic velocity of 91 per second. RIGHT sided ICA end diastolic velocity is 85IO per second. The RIGHT ECA shows a peak systolic velocity of 270JJ per second. The ICA/CCA ratio is calculated to be 0.9. This suggests less than 50% stenosis. The Vertebral Artery shows antegrade flow.  The LEFT CCA shows peak systolic velocity of 009FG per second. The end diastolic velocity is 18EX per second on the LEFT side. The LEFT ICA shows peak systolic velocity of 93ZJI second. LEFT sided ICA end diastolic velocity is 96VE per second. The LEFT ECA shows a peak systolic velocity of 93YB per second. The ICA/CCA ratio is calculated to be 0.8. This suggests less than 50% stenosis. The Vertebral Artery shows antegrade flow.   Impression:    The RIGHT CAROTID shows less than 50% stenosis. The LEFT CAROTID shows less than 50% stenosis.  There is minimal plaque formation noted on the LEFT and minimal plaque on the RIGHT  side. Consider a repeat Carotid doppler if clinical situation and symptoms warrant in 6-12 months. Patient should be encouraged to change lifestyles such as smoking cessation, regular exercise and dietary modification. Use of statins in the right clinical setting and ASA is encouraged.  Allyne Gee, MD [facility] Pulmonary Critical Care Medicine

## 2020-01-06 DIAGNOSIS — J3089 Other allergic rhinitis: Secondary | ICD-10-CM | POA: Diagnosis not present

## 2020-01-06 DIAGNOSIS — J301 Allergic rhinitis due to pollen: Secondary | ICD-10-CM | POA: Diagnosis not present

## 2020-01-06 DIAGNOSIS — J3081 Allergic rhinitis due to animal (cat) (dog) hair and dander: Secondary | ICD-10-CM | POA: Diagnosis not present

## 2020-01-09 DIAGNOSIS — C50919 Malignant neoplasm of unspecified site of unspecified female breast: Secondary | ICD-10-CM | POA: Diagnosis not present

## 2020-01-09 DIAGNOSIS — E063 Autoimmune thyroiditis: Secondary | ICD-10-CM | POA: Diagnosis not present

## 2020-01-09 DIAGNOSIS — L209 Atopic dermatitis, unspecified: Secondary | ICD-10-CM | POA: Diagnosis not present

## 2020-01-09 DIAGNOSIS — K219 Gastro-esophageal reflux disease without esophagitis: Secondary | ICD-10-CM | POA: Diagnosis not present

## 2020-01-09 DIAGNOSIS — I10 Essential (primary) hypertension: Secondary | ICD-10-CM | POA: Diagnosis not present

## 2020-01-09 DIAGNOSIS — I493 Ventricular premature depolarization: Secondary | ICD-10-CM | POA: Diagnosis not present

## 2020-01-09 DIAGNOSIS — E6609 Other obesity due to excess calories: Secondary | ICD-10-CM | POA: Diagnosis not present

## 2020-01-09 DIAGNOSIS — E042 Nontoxic multinodular goiter: Secondary | ICD-10-CM | POA: Diagnosis not present

## 2020-01-20 DIAGNOSIS — K219 Gastro-esophageal reflux disease without esophagitis: Secondary | ICD-10-CM | POA: Diagnosis not present

## 2020-01-20 DIAGNOSIS — E042 Nontoxic multinodular goiter: Secondary | ICD-10-CM | POA: Diagnosis not present

## 2020-01-20 DIAGNOSIS — C50919 Malignant neoplasm of unspecified site of unspecified female breast: Secondary | ICD-10-CM | POA: Diagnosis not present

## 2020-01-20 DIAGNOSIS — L209 Atopic dermatitis, unspecified: Secondary | ICD-10-CM | POA: Diagnosis not present

## 2020-01-20 DIAGNOSIS — E063 Autoimmune thyroiditis: Secondary | ICD-10-CM | POA: Diagnosis not present

## 2020-01-20 DIAGNOSIS — J3081 Allergic rhinitis due to animal (cat) (dog) hair and dander: Secondary | ICD-10-CM | POA: Diagnosis not present

## 2020-01-20 DIAGNOSIS — E6609 Other obesity due to excess calories: Secondary | ICD-10-CM | POA: Diagnosis not present

## 2020-01-20 DIAGNOSIS — J301 Allergic rhinitis due to pollen: Secondary | ICD-10-CM | POA: Diagnosis not present

## 2020-01-20 DIAGNOSIS — I493 Ventricular premature depolarization: Secondary | ICD-10-CM | POA: Diagnosis not present

## 2020-01-20 DIAGNOSIS — I10 Essential (primary) hypertension: Secondary | ICD-10-CM | POA: Diagnosis not present

## 2020-01-20 DIAGNOSIS — Z6833 Body mass index (BMI) 33.0-33.9, adult: Secondary | ICD-10-CM | POA: Diagnosis not present

## 2020-01-20 DIAGNOSIS — J3089 Other allergic rhinitis: Secondary | ICD-10-CM | POA: Diagnosis not present

## 2020-01-26 ENCOUNTER — Other Ambulatory Visit: Payer: Self-pay

## 2020-01-26 DIAGNOSIS — D0512 Intraductal carcinoma in situ of left breast: Secondary | ICD-10-CM

## 2020-01-26 MED ORDER — CARVEDILOL 25 MG PO TABS: 25.0000 mg | ORAL_TABLET | Freq: Two times a day (BID) | ORAL | 2 refills | Status: DC

## 2020-01-27 DIAGNOSIS — J3081 Allergic rhinitis due to animal (cat) (dog) hair and dander: Secondary | ICD-10-CM | POA: Diagnosis not present

## 2020-01-27 DIAGNOSIS — J301 Allergic rhinitis due to pollen: Secondary | ICD-10-CM | POA: Diagnosis not present

## 2020-01-27 DIAGNOSIS — J3089 Other allergic rhinitis: Secondary | ICD-10-CM | POA: Diagnosis not present

## 2020-02-03 DIAGNOSIS — J3081 Allergic rhinitis due to animal (cat) (dog) hair and dander: Secondary | ICD-10-CM | POA: Diagnosis not present

## 2020-02-03 DIAGNOSIS — J3089 Other allergic rhinitis: Secondary | ICD-10-CM | POA: Diagnosis not present

## 2020-02-03 DIAGNOSIS — J301 Allergic rhinitis due to pollen: Secondary | ICD-10-CM | POA: Diagnosis not present

## 2020-02-09 ENCOUNTER — Other Ambulatory Visit: Payer: Self-pay

## 2020-02-09 DIAGNOSIS — I10 Essential (primary) hypertension: Secondary | ICD-10-CM

## 2020-02-09 MED ORDER — AMLODIPINE BESYLATE 2.5 MG PO TABS: 2.5000 mg | ORAL_TABLET | Freq: Two times a day (BID) | ORAL | 2 refills | Status: DC

## 2020-02-13 DIAGNOSIS — J3089 Other allergic rhinitis: Secondary | ICD-10-CM | POA: Diagnosis not present

## 2020-02-13 DIAGNOSIS — J301 Allergic rhinitis due to pollen: Secondary | ICD-10-CM | POA: Diagnosis not present

## 2020-02-13 DIAGNOSIS — J3081 Allergic rhinitis due to animal (cat) (dog) hair and dander: Secondary | ICD-10-CM | POA: Diagnosis not present

## 2020-02-17 DIAGNOSIS — J301 Allergic rhinitis due to pollen: Secondary | ICD-10-CM | POA: Diagnosis not present

## 2020-02-17 DIAGNOSIS — J3089 Other allergic rhinitis: Secondary | ICD-10-CM | POA: Diagnosis not present

## 2020-03-02 DIAGNOSIS — J301 Allergic rhinitis due to pollen: Secondary | ICD-10-CM | POA: Diagnosis not present

## 2020-03-02 DIAGNOSIS — J3081 Allergic rhinitis due to animal (cat) (dog) hair and dander: Secondary | ICD-10-CM | POA: Diagnosis not present

## 2020-03-02 DIAGNOSIS — J3089 Other allergic rhinitis: Secondary | ICD-10-CM | POA: Diagnosis not present

## 2020-03-09 ENCOUNTER — Other Ambulatory Visit: Payer: Self-pay | Admitting: Internal Medicine

## 2020-03-15 ENCOUNTER — Other Ambulatory Visit: Payer: Self-pay

## 2020-03-15 DIAGNOSIS — D0512 Intraductal carcinoma in situ of left breast: Secondary | ICD-10-CM

## 2020-03-15 MED ORDER — HYDROCHLOROTHIAZIDE 25 MG PO TABS: 25.0000 mg | ORAL_TABLET | Freq: Every morning | ORAL | 5 refills | Status: DC

## 2020-03-16 DIAGNOSIS — J3081 Allergic rhinitis due to animal (cat) (dog) hair and dander: Secondary | ICD-10-CM | POA: Diagnosis not present

## 2020-03-16 DIAGNOSIS — J301 Allergic rhinitis due to pollen: Secondary | ICD-10-CM | POA: Diagnosis not present

## 2020-03-16 DIAGNOSIS — J3089 Other allergic rhinitis: Secondary | ICD-10-CM | POA: Diagnosis not present

## 2020-03-23 DIAGNOSIS — J3089 Other allergic rhinitis: Secondary | ICD-10-CM | POA: Diagnosis not present

## 2020-03-23 DIAGNOSIS — J301 Allergic rhinitis due to pollen: Secondary | ICD-10-CM | POA: Diagnosis not present

## 2020-03-23 DIAGNOSIS — J3081 Allergic rhinitis due to animal (cat) (dog) hair and dander: Secondary | ICD-10-CM | POA: Diagnosis not present

## 2020-03-30 DIAGNOSIS — J3081 Allergic rhinitis due to animal (cat) (dog) hair and dander: Secondary | ICD-10-CM | POA: Diagnosis not present

## 2020-03-30 DIAGNOSIS — J301 Allergic rhinitis due to pollen: Secondary | ICD-10-CM | POA: Diagnosis not present

## 2020-03-30 DIAGNOSIS — J3089 Other allergic rhinitis: Secondary | ICD-10-CM | POA: Diagnosis not present

## 2020-04-01 ENCOUNTER — Encounter: Payer: Self-pay | Admitting: Hospice and Palliative Medicine

## 2020-04-01 ENCOUNTER — Ambulatory Visit
Admission: RE | Admit: 2020-04-01 | Discharge: 2020-04-01 | Disposition: A | Discharge status: Home / Self Care | Payer: PPO | Source: Ambulatory Visit | Attending: Hospice and Palliative Medicine | Admitting: Hospice and Palliative Medicine

## 2020-04-01 ENCOUNTER — Other Ambulatory Visit: Payer: Self-pay

## 2020-04-01 ENCOUNTER — Ambulatory Visit (INDEPENDENT_AMBULATORY_CARE_PROVIDER_SITE_OTHER): Payer: PPO | Admitting: Hospice and Palliative Medicine

## 2020-04-01 VITALS — BP 130/80 | HR 77 | Temp 97.9°F | Resp 16 | Ht 60.0 in | Wt 162.0 lb

## 2020-04-01 DIAGNOSIS — M5416 Radiculopathy, lumbar region: Secondary | ICD-10-CM

## 2020-04-01 DIAGNOSIS — Z23 Encounter for immunization: Secondary | ICD-10-CM

## 2020-04-01 DIAGNOSIS — M5441 Lumbago with sciatica, right side: Secondary | ICD-10-CM | POA: Diagnosis not present

## 2020-04-01 DIAGNOSIS — G8929 Other chronic pain: Secondary | ICD-10-CM | POA: Diagnosis not present

## 2020-04-01 DIAGNOSIS — M545 Low back pain, unspecified: Secondary | ICD-10-CM | POA: Diagnosis not present

## 2020-04-01 DIAGNOSIS — R7301 Impaired fasting glucose: Secondary | ICD-10-CM

## 2020-04-01 LAB — POCT GLYCOSYLATED HEMOGLOBIN (HGB A1C): Hemoglobin A1C: 5.6 % (ref 4.0–5.6)

## 2020-04-01 NOTE — Progress Notes (Signed)
[facility] [place], [place] [address]  Internal MEDICINE  Office Visit Note  Patient Name: Stacie Ellis  [id number]  [id number]  Date of Service: 04/02/2020  Chief Complaint  Patient presents with  . Hypertension  . Asthma  . Gastroesophageal Reflux  . Back Pain    Going on for several months  . Quality Metric Gaps    Pneumonia     HPI Patient is here for routine follow-up C/o of right lower back/hip pain, intermittent pain has been ongoing for several months, becoming more frequent Routinely goes to chiropractor for pain management She woke up this morning with pain, was radiating down her right leg but has since resolved after being up and active  She has been focusing on healthy eating to improve her A1C--does not routinely monitor glucose levels at home  Requesting to have COVID antibodies checked  Followed by endocrinology for hashimoto's thyroiditis--nodules and labs remain stable  Would like to discuss possible causes of chronic voice hoarseness, she has been seen by ENT and had several upper endoscopies which were normal with no findings contributing to symptoms At last endocrinology visit provider mentioned possibly being related to    Current Medication: Outpatient Encounter Medications as of 04/01/2020  Medication Sig  . albuterol (PROVENTIL HFA;VENTOLIN HFA) 108 (90 Base) MCG/ACT inhaler Inhale 2 puffs into the lungs every 6 (six) hours as needed for wheezing or shortness of breath.  Marland Kitchen amLODipine (NORVASC) 2.5 MG tablet Take 1 tablet (2.5 mg total) by mouth 2 (two) times daily.  Marland Kitchen aspirin 81 MG tablet Take 81 mg by mouth daily.  . budesonide-formoterol (SYMBICORT) 80-4.5 MCG/ACT inhaler Inhale 2 puffs into the lungs 2 (two) times daily.  . carvedilol (COREG) 25 MG tablet Take 1 tablet (25 mg total) by mouth 2 (two) times daily with a meal.  . hydrochlorothiazide (HYDRODIURIL) 25 MG tablet Take 1 tablet (25 mg total) by mouth  every morning.  . loratadine (CLARITIN) 10 MG tablet Take 10 mg by mouth daily.  . montelukast (SINGULAIR) 10 MG tablet Take 1 tablet (10 mg total) by mouth at bedtime.  . pantoprazole (PROTONIX) 40 MG tablet TAKE 1 TABLET(40 MG) BY MOUTH DAILY  . telmisartan (MICARDIS) 20 MG tablet Take 1 tablet (20 mg total) by mouth daily.   No facility-administered encounter medications on file as of 04/01/2020.    Surgical History: Past Surgical History:  Procedure Laterality Date  . APPENDECTOMY  2008  . BREAST BIOPSY Left 2011   pt states had stereo done on mobile unit at Dr. Dwyane Luo office. DCIS  . BREAST LUMPECTOMY Left 02/2010   lumpectomy with re excision 03/2010 of left breast for cancer and rad tx  . BREAST SURGERY Left 02/17/2010;03/10/2010   lumpectomy and repeat wide excision  . CESAREAN SECTION    . COLON SURGERY Right 06/13/2006   Hand-assisted right hemicolectomy. 5.8 cm villous adenoma without atypia  . COLONOSCOPY  2015   Normal exam.  . COLONOSCOPY WITH PROPOFOL N/A 05/29/2019   Procedure: COLONOSCOPY WITH PROPOFOL;  Surgeon: Lucilla Lame, MD;  Location: [facility] ENDOSCOPY;  Service: Endoscopy;  Laterality: N/A;  . ESOPHAGOGASTRODUODENOSCOPY (EGD) WITH PROPOFOL N/A 06/21/2015   Procedure: ESOPHAGOGASTRODUODENOSCOPY (EGD) WITH PROPOFOL;  Surgeon: Hulen Luster, MD;  Location: [facility] ENDOSCOPY;  Service: Gastroenterology;  Laterality: N/A;    Medical History: Past Medical History:  Diagnosis Date  . Asthma   . Breast cancer ([place]) 02/2010  . GERD (gastroesophageal reflux disease) 2008  .  Hypertension   . Malignant neoplasm of upper-outer quadrant of female breast ([place]) 2011   Intermediate grade DCIS, ER: 50%; PR 10%. left breast lumpectomy, wide excision and a repeat wide excision on 03/10/2010 for multiple positive margins on original resection  . Malignant neoplasm of upper-outer quadrant of female breast [facility]) January 2012:    Re-excision to negative margins.   . Obesity, unspecified    . Postmenopausal bleeding 2013  . S/P radiation therapy 2012   whole breast,   . Special screening for malignant neoplasms, colon     Family History: Family History  Problem Relation Age of Onset  . Cancer Father        colon  . Cancer Other        unknown family member with breast cancer  . Colon polyps Other        unknown family member with colon polyps    Social History   Socioeconomic History  . Marital status: Married    Spouse name: Not on file  . Number of children: Not on file  . Years of education: Not on file  . Highest education level: Not on file  Occupational History  . Not on file  Tobacco Use  . Smoking status: Never Smoker  . Smokeless tobacco: Never Used  Vaping Use  . Vaping Use: Never used  Substance and Sexual Activity  . Alcohol use: No  . Drug use: No  . Sexual activity: Yes    Birth control/protection: Post-menopausal  Other Topics Concern  . Not on file  Social History Narrative  . Not on file   Social Determinants of Health   Financial Resource Strain: Not on file  Food Insecurity: Not on file  Transportation Needs: Not on file  Physical Activity: Not on file  Stress: Not on file  Social Connections: Not on file  Intimate Partner Violence: Not on file      Review of Systems  Constitutional: Negative for chills, diaphoresis and fatigue.  HENT: Negative for ear pain, postnasal drip and sinus pressure.   Eyes: Negative for photophobia, discharge, redness, itching and visual disturbance.  Respiratory: Negative for cough, shortness of breath and wheezing.   Cardiovascular: Negative for chest pain, palpitations and leg swelling.  Gastrointestinal: Negative for abdominal pain, constipation, diarrhea, nausea and vomiting.  Genitourinary: Negative for dysuria and flank pain.  Musculoskeletal: Positive for back pain. Negative for arthralgias, gait problem and neck pain.  Skin: Negative for color change.  Allergic/Immunologic: Negative  for environmental allergies and food allergies.  Neurological: Negative for dizziness and headaches.  Hematological: Does not bruise/bleed easily.  Psychiatric/Behavioral: Negative for agitation, behavioral problems (depression) and hallucinations.    Vital Signs: BP 130/80   Pulse 77   Temp 97.9 F (36.6 C)   Resp 16   Ht 5' (1.524 m)   Wt 162 lb (73.5 kg)   SpO2 96%   BMI 31.64 kg/m    Physical Exam Vitals reviewed.  Constitutional:      Appearance: Normal appearance.  HENT:     Mouth/Throat:     Comments: Hoarseness in voice Cardiovascular:     Rate and Rhythm: Normal rate and regular rhythm.     Pulses: Normal pulses.     Heart sounds: Normal heart sounds.  Pulmonary:     Effort: Pulmonary effort is normal.     Breath sounds: Normal breath sounds.  Abdominal:     General: Abdomen is flat.     Palpations: Abdomen is  soft.  Musculoskeletal:        General: Normal range of motion.     Cervical back: Normal range of motion.  Skin:    General: Skin is warm.  Neurological:     General: No focal deficit present.     Mental Status: She is alert and oriented to person, place, and time. Mental status is at baseline.  Psychiatric:        Mood and Affect: Mood normal.        Behavior: Behavior normal.        Thought Content: Thought content normal.        Judgment: Judgment normal.    Assessment/Plan: 1. Chronic right-sided low back pain with right-sided sciatica Will obtain imaging for review Continue with supportive therapy for pain relief, low impact activity, heat/ice, acetaminophen/ibuprofen as needed - DG Lumbar Spine Complete; Future  2. Impaired fasting glucose A1C improved at 5.6 today--diet controlled Advised to continue with healthy diet--will continue to monitor  - POCT HgB A1C  3. Encounter for administration of COVID-19 vaccine - SAR CoV2 Serology (COVID 19)AB(IGG)IA  General Counseling: Morghan verbalizes understanding of the findings of todays  visit and agrees with plan of treatment. I have discussed any further diagnostic evaluation that may be needed or ordered today. We also reviewed her medications today. she has been encouraged to call the office with any questions or concerns that should arise related to todays visit.    Orders Placed This Encounter  Procedures  . DG Lumbar Spine Complete  . SAR CoV2 Serology (COVID 19)AB(IGG)IA  . POCT HgB A1C   Time spent: 30 Minutes Time spent includes review of chart, medications, test results and follow-up plan with the patient.  This patient was seen by Theodoro Grist AGNP-C in Collaboration with Dr Lavera Guise as a part of collaborative care agreement     Tanna Furry. Makiah Foye AGNP-C Internal medicine

## 2020-04-02 ENCOUNTER — Encounter: Payer: Self-pay | Admitting: Hospice and Palliative Medicine

## 2020-04-02 LAB — SAR COV2 SEROLOGY (COVID19)AB(IGG),IA
SARS-CoV-2 Semi-Quant IgG Ab: 117 AU/mL (ref <13.0)
SARS-CoV-2 Spike Ab Interp: POSITIVE

## 2020-04-06 DIAGNOSIS — J3081 Allergic rhinitis due to animal (cat) (dog) hair and dander: Secondary | ICD-10-CM | POA: Diagnosis not present

## 2020-04-06 DIAGNOSIS — J301 Allergic rhinitis due to pollen: Secondary | ICD-10-CM | POA: Diagnosis not present

## 2020-04-06 DIAGNOSIS — J3089 Other allergic rhinitis: Secondary | ICD-10-CM | POA: Diagnosis not present

## 2020-04-13 DIAGNOSIS — J3081 Allergic rhinitis due to animal (cat) (dog) hair and dander: Secondary | ICD-10-CM | POA: Diagnosis not present

## 2020-04-13 DIAGNOSIS — J3089 Other allergic rhinitis: Secondary | ICD-10-CM | POA: Diagnosis not present

## 2020-04-13 DIAGNOSIS — J301 Allergic rhinitis due to pollen: Secondary | ICD-10-CM | POA: Diagnosis not present

## 2020-04-22 ENCOUNTER — Other Ambulatory Visit: Payer: Self-pay

## 2020-04-22 DIAGNOSIS — D0512 Intraductal carcinoma in situ of left breast: Secondary | ICD-10-CM

## 2020-04-22 MED ORDER — CARVEDILOL 25 MG PO TABS: 25.0000 mg | ORAL_TABLET | Freq: Two times a day (BID) | ORAL | 2 refills | Status: DC

## 2020-04-27 DIAGNOSIS — J301 Allergic rhinitis due to pollen: Secondary | ICD-10-CM | POA: Diagnosis not present

## 2020-04-27 DIAGNOSIS — J3089 Other allergic rhinitis: Secondary | ICD-10-CM | POA: Diagnosis not present

## 2020-04-27 DIAGNOSIS — J3081 Allergic rhinitis due to animal (cat) (dog) hair and dander: Secondary | ICD-10-CM | POA: Diagnosis not present

## 2020-04-29 DIAGNOSIS — J3081 Allergic rhinitis due to animal (cat) (dog) hair and dander: Secondary | ICD-10-CM | POA: Diagnosis not present

## 2020-04-29 DIAGNOSIS — J3089 Other allergic rhinitis: Secondary | ICD-10-CM | POA: Diagnosis not present

## 2020-05-04 DIAGNOSIS — J3081 Allergic rhinitis due to animal (cat) (dog) hair and dander: Secondary | ICD-10-CM | POA: Diagnosis not present

## 2020-05-04 DIAGNOSIS — J301 Allergic rhinitis due to pollen: Secondary | ICD-10-CM | POA: Diagnosis not present

## 2020-05-04 DIAGNOSIS — J3089 Other allergic rhinitis: Secondary | ICD-10-CM | POA: Diagnosis not present

## 2020-05-12 ENCOUNTER — Other Ambulatory Visit: Payer: Self-pay

## 2020-05-12 DIAGNOSIS — I10 Essential (primary) hypertension: Secondary | ICD-10-CM

## 2020-05-12 MED ORDER — AMLODIPINE BESYLATE 2.5 MG PO TABS: 2.5000 mg | ORAL_TABLET | Freq: Two times a day (BID) | ORAL | 2 refills | Status: DC

## 2020-05-18 DIAGNOSIS — J3081 Allergic rhinitis due to animal (cat) (dog) hair and dander: Secondary | ICD-10-CM | POA: Diagnosis not present

## 2020-05-18 DIAGNOSIS — J3089 Other allergic rhinitis: Secondary | ICD-10-CM | POA: Diagnosis not present

## 2020-05-18 DIAGNOSIS — J301 Allergic rhinitis due to pollen: Secondary | ICD-10-CM | POA: Diagnosis not present

## 2020-05-20 ENCOUNTER — Telehealth: Payer: Self-pay

## 2020-05-20 ENCOUNTER — Other Ambulatory Visit: Payer: Self-pay | Admitting: Internal Medicine

## 2020-05-20 DIAGNOSIS — D0512 Intraductal carcinoma in situ of left breast: Secondary | ICD-10-CM

## 2020-05-20 NOTE — Telephone Encounter (Signed)
Pt would like [facility] to reorder if possible Bone Density, she forgot about it and when she called to schedule order had expired. Has annual scheduled on 06/02/20 with [facility]. Please advise.

## 2020-05-24 ENCOUNTER — Other Ambulatory Visit: Payer: Self-pay | Admitting: Obstetrics and Gynecology

## 2020-05-24 ENCOUNTER — Encounter: Payer: Self-pay | Admitting: Obstetrics and Gynecology

## 2020-05-24 DIAGNOSIS — Z780 Asymptomatic menopausal state: Secondary | ICD-10-CM

## 2020-05-24 NOTE — Progress Notes (Signed)
po

## 2020-05-24 NOTE — Telephone Encounter (Signed)
Pt aware.

## 2020-05-24 NOTE — Telephone Encounter (Signed)
Has been ordered she should get a call from them

## 2020-05-25 DIAGNOSIS — J301 Allergic rhinitis due to pollen: Secondary | ICD-10-CM | POA: Diagnosis not present

## 2020-05-25 DIAGNOSIS — J3081 Allergic rhinitis due to animal (cat) (dog) hair and dander: Secondary | ICD-10-CM | POA: Diagnosis not present

## 2020-05-25 DIAGNOSIS — J3089 Other allergic rhinitis: Secondary | ICD-10-CM | POA: Diagnosis not present

## 2020-06-01 DIAGNOSIS — J3089 Other allergic rhinitis: Secondary | ICD-10-CM | POA: Diagnosis not present

## 2020-06-01 DIAGNOSIS — J3081 Allergic rhinitis due to animal (cat) (dog) hair and dander: Secondary | ICD-10-CM | POA: Diagnosis not present

## 2020-06-01 DIAGNOSIS — J301 Allergic rhinitis due to pollen: Secondary | ICD-10-CM | POA: Diagnosis not present

## 2020-06-02 ENCOUNTER — Ambulatory Visit (INDEPENDENT_AMBULATORY_CARE_PROVIDER_SITE_OTHER): Payer: PPO | Admitting: Obstetrics and Gynecology

## 2020-06-02 ENCOUNTER — Encounter: Payer: Self-pay | Admitting: Obstetrics and Gynecology

## 2020-06-02 ENCOUNTER — Other Ambulatory Visit: Payer: Self-pay

## 2020-06-02 VITALS — BP 142/80 | HR 88 | Ht 60.0 in | Wt 163.0 lb

## 2020-06-02 DIAGNOSIS — Z01419 Encounter for gynecological examination (general) (routine) without abnormal findings: Secondary | ICD-10-CM | POA: Diagnosis not present

## 2020-06-02 DIAGNOSIS — Z1231 Encounter for screening mammogram for malignant neoplasm of breast: Secondary | ICD-10-CM

## 2020-06-02 DIAGNOSIS — Z1239 Encounter for other screening for malignant neoplasm of breast: Secondary | ICD-10-CM

## 2020-06-02 NOTE — Patient Instructions (Signed)
[facility] [address] [place] [address]  [facility] [place]  [address] [place]  [address]  Phone: [phone]  

## 2020-06-02 NOTE — Progress Notes (Signed)
Gynecology Annual Exam  PCP: Lavera Guise, MD  Chief Complaint:  Chief Complaint  Patient presents with  . Gynecologic Exam    Annual - no concerns. RM 5    History of Present Illness:Patient is a 65 y.o. G2P0011 presents for annual exam. The patient has no complaints today.   LMP: No LMP recorded. Patient is postmenopausal. No postmenopausal bleeding  The patient is not sexually active. The patient does perform self breast exams.  There is notable personal history of breast cancer.  The patient wears seatbelts: yes.   The patient has regular exercise: not asked.    The patient denies current symptoms of depression.     Review of Systems: Review of Systems  Constitutional: Negative for chills and fever.  HENT: Negative for congestion.   Respiratory: Negative for cough and shortness of breath.   Cardiovascular: Negative for chest pain and palpitations.  Gastrointestinal: Negative for abdominal pain, constipation, diarrhea, heartburn, nausea and vomiting.  Genitourinary: Negative for dysuria, frequency and urgency.  Skin: Negative for itching and rash.  Neurological: Negative for dizziness and headaches.  Endo/Heme/Allergies: Negative for polydipsia.  Psychiatric/Behavioral: Negative for depression.    Past Medical History:  Patient Active Problem List   Diagnosis Date Noted  . Localized swelling of both lower legs 09/08/2019  . Dysuria 09/08/2019  . Personal history of colon cancer   . Polyp of descending colon   . Gastroesophageal reflux disease without esophagitis 04/07/2019  . Mild intermittent asthma without complication 76/28/3151  . Pre-diabetes 01/16/2018  . Hypomagnesemia 01/16/2018  . Essential hypertension 08/05/2017  . Impaired fasting glucose 08/05/2017  . Mixed hyperlipidemia 08/05/2017  . Vitamin D deficiency 08/05/2017  . Acute upper respiratory infection 06/27/2017  . Cough 06/27/2017  . Allergic rhinitis due to pollen 06/27/2017  . Ventral  hernia without obstruction or gangrene 05/29/2016  . Acute chest pain 06/29/2014  . History of tachycardia 06/29/2014  . Skin lesion of chest wall 03/20/2014  . Disorder of skin or subcutaneous tissue 03/20/2014  . Personal history of colonic polyps 08/08/2013    Diagnosis: RIGHT COLON, RIGHT HEMICOLECTOMY: -VILLOUS ADENOMA (5.8CM) OF CECUM. -NEGATIVE FOR HIGH GRADE DYSPLASIA AND MALIGNANCY. -SEVENTEEN(17) LYMPH NODE   . Encounter for general adult medical examination with abnormal findings 08/08/2013  . History of breast cancer 02/11/2013  . Malignant neoplasm of upper-outer quadrant of female breast [facility])     Past Surgical History:  Past Surgical History:  Procedure Laterality Date  . APPENDECTOMY  2008  . BREAST BIOPSY Left 2011   pt states had stereo done on mobile unit at Dr. Dwyane Luo office. DCIS  . BREAST LUMPECTOMY Left 02/2010   lumpectomy with re excision 03/2010 of left breast for cancer and rad tx  . BREAST SURGERY Left 02/17/2010;03/10/2010   lumpectomy and repeat wide excision  . CESAREAN SECTION    . COLON SURGERY Right 06/13/2006   Hand-assisted right hemicolectomy. 5.8 cm villous adenoma without atypia  . COLONOSCOPY  2015   Normal exam.  . COLONOSCOPY WITH PROPOFOL N/A 05/29/2019   Procedure: COLONOSCOPY WITH PROPOFOL;  Surgeon: Lucilla Lame, MD;  Location: [facility] ENDOSCOPY;  Service: Endoscopy;  Laterality: N/A;  . ESOPHAGOGASTRODUODENOSCOPY (EGD) WITH PROPOFOL N/A 06/21/2015   Procedure: ESOPHAGOGASTRODUODENOSCOPY (EGD) WITH PROPOFOL;  Surgeon: Hulen Luster, MD;  Location: [facility] ENDOSCOPY;  Service: Gastroenterology;  Laterality: N/A;    Gynecologic History:  No LMP recorded. Patient is postmenopausal. Last Pap: Results were: 04/11/2019 NIL and HR HPV negative  Last mammogram: 07/2019 Results were: BI-RAD I  Obstetric History: G2P0011  Family History:  Family History  Problem Relation Age of Onset  . Cancer Father        colon  . Cancer Other        unknown  family member with breast cancer  . Colon polyps Other        unknown family member with colon polyps    Social History:  Social History   Socioeconomic History  . Marital status: Married    Spouse name: Not on file  . Number of children: Not on file  . Years of education: Not on file  . Highest education level: Not on file  Occupational History  . Not on file  Tobacco Use  . Smoking status: Never Smoker  . Smokeless tobacco: Never Used  Vaping Use  . Vaping Use: Never used  Substance and Sexual Activity  . Alcohol use: No  . Drug use: No  . Sexual activity: Yes    Birth control/protection: Post-menopausal  Other Topics Concern  . Not on file  Social History Narrative  . Not on file   Social Determinants of Health   Financial Resource Strain: Not on file  Food Insecurity: Not on file  Transportation Needs: Not on file  Physical Activity: Not on file  Stress: Not on file  Social Connections: Not on file  Intimate Partner Violence: Not on file    Allergies:  Allergies  Allergen Reactions  . Levaquin [Levofloxacin] Other (See Comments)    Severe tightness in left leg  . Penicillins Rash    Medications: Prior to Admission medications   Medication Sig Start Date End Date Taking? Authorizing Provider  albuterol (PROVENTIL HFA;VENTOLIN HFA) 108 (90 Base) MCG/ACT inhaler Inhale 2 puffs into the lungs every 6 (six) hours as needed for wheezing or shortness of breath.   Yes [provider]  amLODipine (NORVASC) 2.5 MG tablet Take 1 tablet (2.5 mg total) by mouth 2 (two) times daily. 05/12/20  Yes Lavera Guise, MD  aspirin 81 MG tablet Take 81 mg by mouth daily.   Yes [provider]  budesonide-formoterol (SYMBICORT) 80-4.5 MCG/ACT inhaler Inhale 2 puffs into the lungs 2 (two) times daily.   Yes [provider]  carvedilol (COREG) 25 MG tablet TAKE 1 TABLET(25 MG) BY MOUTH TWICE DAILY WITH A MEAL 05/20/20  Yes Lavera Guise, MD   hydrochlorothiazide (HYDRODIURIL) 25 MG tablet Take 1 tablet (25 mg total) by mouth every morning. 03/15/20  Yes Boscia, Greer Ee, NP  loratadine (CLARITIN) 10 MG tablet Take 10 mg by mouth daily.   Yes [provider]  montelukast (SINGULAIR) 10 MG tablet Take 1 tablet (10 mg total) by mouth at bedtime. 12/12/19  Yes Lavera Guise, MD  pantoprazole (PROTONIX) 40 MG tablet TAKE 1 TABLET(40 MG) BY MOUTH DAILY 03/09/20  Yes Lavera Guise, MD  telmisartan (MICARDIS) 20 MG tablet Take 1 tablet (20 mg total) by mouth daily. 12/12/19  Yes Lavera Guise, MD    Physical Exam Vitals: Blood pressure (!) 142/80, pulse 88, height 5' (1.524 m), weight 163 lb (73.9 kg).  General: NAD HEENT: normocephalic, anicteric Thyroid: no enlargement, no palpable nodules Pulmonary: No increased work of breathing, CTAB Cardiovascular: RRR, distal pulses 2+ Breast: Breast symmetrical, no tenderness, no palpable nodules or masses, no skin or nipple retraction present, no nipple discharge.  No axillary or supraclavicular lymphadenopathy. Abdomen: NABS, soft, non-tender, non-distended.  Umbilicus without  lesions.  No hepatomegaly, splenomegaly or masses palpable. No evidence of hernia  Genitourinary:  External: Normal external female genitalia.  Normal urethral meatus, normal Bartholin's and Skene's glands.    Vagina: Normal vaginal mucosa, no evidence of prolapse.    Cervix: Grossly normal in appearance, no bleeding  Uterus: Non-enlarged, mobile, normal contour.  No CMT  Adnexa: ovaries non-enlarged, no adnexal masses  Rectal: deferred  Lymphatic: no evidence of inguinal lymphadenopathy Extremities: no edema, erythema, or tenderness Neurologic: Grossly intact Psychiatric: mood appropriate, affect full  Female chaperone present for pelvic and breast  portions of the physical exam  There is no immunization history for the selected administration types on file for this patient.    Assessment: 65 y.o. G2P0011  routine annual exam  Plan: Problem List Items Addressed This Visit   None   Visit Diagnoses    Encounter for gynecological examination without abnormal finding    -  Primary   Breast screening       Relevant Orders   MM 3D SCREEN BREAST BILATERAL   Breast cancer screening by mammogram          1) Mammogram - recommend yearly screening mammogram.  Mammogram Was ordered today  2) STI screening  was notoffered and therefore not obtained  3) ASCCP guidelines and rational discussed.  Patient opts for every 3 years screening interval  4) Osteoporosis  - per USPTF routine screening DEXA at age 7 - DEXA ordered 3/31  5) Routine healthcare maintenance including cholesterol, diabetes screening discussed managed by PCP  6) Colonoscopy  - 05/30/06 Villious adenoma - 09/09/13 normal with repeat in 5 years 2020  7) Return in about 1 year (around 06/02/2021) for annual.    Malachy Mood, MD Mosetta Pigeon, [facility] 06/02/2020, 10:47 AM

## 2020-06-03 ENCOUNTER — Ambulatory Visit: Payer: PPO | Admitting: Hospice and Palliative Medicine

## 2020-06-07 ENCOUNTER — Telehealth: Payer: Self-pay | Admitting: Internal Medicine

## 2020-06-07 NOTE — Progress Notes (Signed)
  Chronic Care Management   Note  06/07/2020 Name: Stacie Ellis MRN: [id number] DOB: [birth year]  Stacie Ellis is a 65 y.o. year old female who is a primary care patient of Lavera Guise, MD. I reached out to Ansel Bong by phone today in response to a referral sent by Ms. Yong Channel PCP, Lavera Guise, MD.   Ms. Boccio was given information about Chronic Care Management services today including:  1. CCM service includes personalized support from designated clinical staff supervised by her physician, including individualized plan of care and coordination with other care providers 2. 24/7 contact phone numbers for assistance for urgent and routine care needs. 3. Service will only be billed when office clinical staff spend 20 minutes or more in a month to coordinate care. 4. Only one practitioner may furnish and bill the service in a calendar month. 5. The patient may stop CCM services at any time (effective at the end of the month) by phone call to the office staff.   Patient did not agree to enrollment in care management services and does not wish to consider at this time.  Follow up plan:   Carley Perdue [employer] Scheduler

## 2020-06-07 NOTE — Progress Notes (Signed)
  Chronic Care Management   Outreach Note  06/07/2020 Name: Junita Kubota MRN: [id number] DOB: June 06, 1955  Referred by: Lavera Guise, MD Reason for referral : No chief complaint on file.   An unsuccessful telephone outreach was attempted today. The patient was referred to the pharmacist for assistance with care management and care coordination.   Follow Up Plan:   Carley Perdue [employer] Scheduler

## 2020-06-10 DIAGNOSIS — J301 Allergic rhinitis due to pollen: Secondary | ICD-10-CM | POA: Diagnosis not present

## 2020-06-10 DIAGNOSIS — J3089 Other allergic rhinitis: Secondary | ICD-10-CM | POA: Diagnosis not present

## 2020-06-10 DIAGNOSIS — J3081 Allergic rhinitis due to animal (cat) (dog) hair and dander: Secondary | ICD-10-CM | POA: Diagnosis not present

## 2020-06-11 ENCOUNTER — Other Ambulatory Visit: Payer: Self-pay | Admitting: Internal Medicine

## 2020-06-14 ENCOUNTER — Other Ambulatory Visit: Payer: Self-pay | Admitting: Nurse Practitioner

## 2020-06-14 DIAGNOSIS — D0512 Intraductal carcinoma in situ of left breast: Secondary | ICD-10-CM

## 2020-06-22 DIAGNOSIS — J3081 Allergic rhinitis due to animal (cat) (dog) hair and dander: Secondary | ICD-10-CM | POA: Diagnosis not present

## 2020-06-22 DIAGNOSIS — J3089 Other allergic rhinitis: Secondary | ICD-10-CM | POA: Diagnosis not present

## 2020-06-22 DIAGNOSIS — J301 Allergic rhinitis due to pollen: Secondary | ICD-10-CM | POA: Diagnosis not present

## 2020-06-29 DIAGNOSIS — J3089 Other allergic rhinitis: Secondary | ICD-10-CM | POA: Diagnosis not present

## 2020-06-29 DIAGNOSIS — J301 Allergic rhinitis due to pollen: Secondary | ICD-10-CM | POA: Diagnosis not present

## 2020-06-29 DIAGNOSIS — J3081 Allergic rhinitis due to animal (cat) (dog) hair and dander: Secondary | ICD-10-CM | POA: Diagnosis not present

## 2020-06-30 ENCOUNTER — Encounter: Payer: Self-pay | Admitting: Hospice and Palliative Medicine

## 2020-06-30 ENCOUNTER — Ambulatory Visit (INDEPENDENT_AMBULATORY_CARE_PROVIDER_SITE_OTHER): Payer: PPO | Admitting: Hospice and Palliative Medicine

## 2020-06-30 ENCOUNTER — Other Ambulatory Visit: Payer: Self-pay

## 2020-06-30 VITALS — BP 140/82 | HR 84 | Temp 98.8°F | Resp 16 | Ht 60.0 in | Wt 162.0 lb

## 2020-06-30 DIAGNOSIS — I10 Essential (primary) hypertension: Secondary | ICD-10-CM

## 2020-06-30 DIAGNOSIS — J301 Allergic rhinitis due to pollen: Secondary | ICD-10-CM

## 2020-06-30 DIAGNOSIS — R7303 Prediabetes: Secondary | ICD-10-CM | POA: Diagnosis not present

## 2020-06-30 NOTE — Progress Notes (Signed)
[facility] [place],  [address]  Internal MEDICINE  Office Visit Note  Patient Name: Stacie Ellis  [id number]  [id number]  Date of Service: 07/07/2020  Chief Complaint  Patient presents with  . Follow-up  . Hypertension  . Asthma  . Gastroesophageal Reflux  . Cancer    HPI Patient is here for routine follow-up Receives allergy injections which had significantly improved allergy symptoms Low back pain as well as hip pain has also improved, continues to see chiropractor  Will be scheduling mammogram and bone density in May  Has been taking 1/2 tablet of telmisartan as endocrinologist thought chronic hoarseness may be related to ARB use--has not noticed improvement in her voice, BP has remained well controlled  Current Medication: Outpatient Encounter Medications as of 06/30/2020  Medication Sig  . albuterol (PROVENTIL HFA;VENTOLIN HFA) 108 (90 Base) MCG/ACT inhaler Inhale 2 puffs into the lungs every 6 (six) hours as needed for wheezing or shortness of breath.  Marland Kitchen amLODipine (NORVASC) 2.5 MG tablet Take 1 tablet (2.5 mg total) by mouth 2 (two) times daily.  Marland Kitchen aspirin 81 MG tablet Take 81 mg by mouth daily.  . budesonide-formoterol (SYMBICORT) 80-4.5 MCG/ACT inhaler Inhale 2 puffs into the lungs 2 (two) times daily.  . carvedilol (COREG) 25 MG tablet TAKE 1 TABLET(25 MG) BY MOUTH TWICE DAILY WITH A MEAL  . hydrochlorothiazide (HYDRODIURIL) 25 MG tablet Take 1 tablet (25 mg total) by mouth every morning.  . loratadine (CLARITIN) 10 MG tablet Take 10 mg by mouth daily.  . montelukast (SINGULAIR) 10 MG tablet Take 1 tablet (10 mg total) by mouth at bedtime.  . pantoprazole (PROTONIX) 40 MG tablet TAKE 1 TABLET(40 MG) BY MOUTH DAILY  . telmisartan (MICARDIS) 20 MG tablet TAKE 1 TABLET(20 MG) BY MOUTH DAILY   No facility-administered encounter medications on file as of 06/30/2020.    Surgical History: Past Surgical History:  Procedure  Laterality Date  . APPENDECTOMY  2008  . BREAST BIOPSY Left 2011   pt states had stereo done on mobile unit at Dr. Dwyane Luo office. DCIS  . BREAST LUMPECTOMY Left 02/2010   lumpectomy with re excision 03/2010 of left breast for cancer and rad tx  . BREAST SURGERY Left 02/17/2010;03/10/2010   lumpectomy and repeat wide excision  . CESAREAN SECTION    . COLON SURGERY Right 06/13/2006   Hand-assisted right hemicolectomy. 5.8 cm villous adenoma without atypia  . COLONOSCOPY  2015   Normal exam.  . COLONOSCOPY WITH PROPOFOL N/A 05/29/2019   Procedure: COLONOSCOPY WITH PROPOFOL;  Surgeon: Lucilla Lame, MD;  Location: [facility] ENDOSCOPY;  Service: Endoscopy;  Laterality: N/A;  . ESOPHAGOGASTRODUODENOSCOPY (EGD) WITH PROPOFOL N/A 06/21/2015   Procedure: ESOPHAGOGASTRODUODENOSCOPY (EGD) WITH PROPOFOL;  Surgeon: Hulen Luster, MD;  Location: [facility] ENDOSCOPY;  Service: Gastroenterology;  Laterality: N/A;    Medical History: Past Medical History:  Diagnosis Date  . Asthma   . Breast cancer ([place]) 02/2010  . GERD (gastroesophageal reflux disease) 2008  . Hypertension   . Malignant neoplasm of upper-outer quadrant of female breast ([place]) 2011   Intermediate grade DCIS, ER: 50%; PR 10%. left breast lumpectomy, wide excision and a repeat wide excision on 03/10/2010 for multiple positive margins on original resection  . Malignant neoplasm of upper-outer quadrant of female breast [facility]) January 2012:    Re-excision to negative margins.   . Obesity, unspecified   . Postmenopausal bleeding 2013  . S/P radiation therapy 2012   whole breast,   .  Special screening for malignant neoplasms, colon     Family History: Family History  Problem Relation Age of Onset  . Cancer Father        colon  . Cancer Other        unknown family member with breast cancer  . Colon polyps Other        unknown family member with colon polyps    Social History   Socioeconomic History  . Marital status: Married    Spouse name:  Not on file  . Number of children: Not on file  . Years of education: Not on file  . Highest education level: Not on file  Occupational History  . Not on file  Tobacco Use  . Smoking status: Never Smoker  . Smokeless tobacco: Never Used  Vaping Use  . Vaping Use: Never used  Substance and Sexual Activity  . Alcohol use: No  . Drug use: No  . Sexual activity: Yes    Birth control/protection: Post-menopausal  Other Topics Concern  . Not on file  Social History Narrative  . Not on file   Social Determinants of Health   Financial Resource Strain: Not on file  Food Insecurity: Not on file  Transportation Needs: Not on file  Physical Activity: Not on file  Stress: Not on file  Social Connections: Not on file  Intimate Partner Violence: Not on file      Review of Systems  Constitutional: Negative for chills, diaphoresis and fatigue.  HENT: Negative for ear pain, postnasal drip and sinus pressure.   Eyes: Negative for photophobia, discharge, redness, itching and visual disturbance.  Respiratory: Negative for cough, shortness of breath and wheezing.   Cardiovascular: Negative for chest pain, palpitations and leg swelling.  Gastrointestinal: Negative for abdominal pain, constipation, diarrhea, nausea and vomiting.  Genitourinary: Negative for dysuria and flank pain.  Musculoskeletal: Negative for arthralgias, back pain, gait problem and neck pain.  Skin: Negative for color change.  Allergic/Immunologic: Negative for environmental allergies and food allergies.  Neurological: Negative for dizziness and headaches.  Hematological: Does not bruise/bleed easily.  Psychiatric/Behavioral: Negative for agitation, behavioral problems (depression) and hallucinations.    Vital Signs: BP 140/82   Pulse 84   Temp 98.8 F (37.1 C)   Resp 16   Ht 5' (1.524 m)   Wt 162 lb (73.5 kg)   SpO2 97%   BMI 31.64 kg/m    Physical Exam Vitals reviewed.  Constitutional:      Appearance:  Normal appearance. She is normal weight.  Cardiovascular:     Rate and Rhythm: Normal rate and regular rhythm.     Pulses: Normal pulses.     Heart sounds: Normal heart sounds.  Pulmonary:     Effort: Pulmonary effort is normal.     Breath sounds: Normal breath sounds.  Musculoskeletal:        General: Normal range of motion.     Cervical back: Normal range of motion.  Skin:    General: Skin is warm.  Neurological:     General: No focal deficit present.     Mental Status: She is alert and oriented to person, place, and time. Mental status is at baseline.  Psychiatric:        Mood and Affect: Mood normal.        Behavior: Behavior normal.        Thought Content: Thought content normal.        Judgment: Judgment normal.    Assessment/Plan:  1. Pre-diabetes A1C 5.6--continue with healthy eating and lifestyle changes Continue to monitor - POCT HgB A1C  2. Essential hypertension BP and HR remain well controlled on present management, continue to monitor  3. Allergic rhinitis due to pollen, unspecified seasonality Continue with allergy injections as well as all other supportive measures, continue to monitor  General Counseling: denika krone understanding of the findings of todays visit and agrees with plan of treatment. I have discussed any further diagnostic evaluation that may be needed or ordered today. We also reviewed her medications today. she has been encouraged to call the office with any questions or concerns that should arise related to todays visit.    Orders Placed This Encounter  Procedures  . POCT HgB A1C   Time spent:30 Minutes Time spent includes review of chart, medications, test results and follow-up plan with the patient.  This patient was seen by Theodoro Grist AGNP-C in Collaboration with Dr Lavera Guise as a part of collaborative care agreement     Tanna Furry. Darnell Jeschke AGNP-C Internal medicine

## 2020-07-02 ENCOUNTER — Telehealth: Payer: Self-pay

## 2020-07-02 NOTE — Telephone Encounter (Signed)
Pt calling; needs rx for mastectomy prosthetic sent to [facility]; has been talking c Teresa at [phone].  Pt's # [phone]

## 2020-07-06 DIAGNOSIS — J3089 Other allergic rhinitis: Secondary | ICD-10-CM | POA: Diagnosis not present

## 2020-07-06 DIAGNOSIS — J301 Allergic rhinitis due to pollen: Secondary | ICD-10-CM | POA: Diagnosis not present

## 2020-07-06 DIAGNOSIS — J3081 Allergic rhinitis due to animal (cat) (dog) hair and dander: Secondary | ICD-10-CM | POA: Diagnosis not present

## 2020-07-06 NOTE — Telephone Encounter (Signed)
It was signed last week

## 2020-07-06 NOTE — Telephone Encounter (Signed)
No clue it was a written form that I handed back to [facility]

## 2020-07-06 NOTE — Telephone Encounter (Signed)
Pt aware.

## 2020-07-07 ENCOUNTER — Encounter: Payer: Self-pay | Admitting: Hospice and Palliative Medicine

## 2020-07-07 LAB — POCT GLYCOSYLATED HEMOGLOBIN (HGB A1C): Hemoglobin A1C: 5.6 % (ref 4.0–5.6)

## 2020-07-13 DIAGNOSIS — J301 Allergic rhinitis due to pollen: Secondary | ICD-10-CM | POA: Diagnosis not present

## 2020-07-13 DIAGNOSIS — J3089 Other allergic rhinitis: Secondary | ICD-10-CM | POA: Diagnosis not present

## 2020-07-13 DIAGNOSIS — J3081 Allergic rhinitis due to animal (cat) (dog) hair and dander: Secondary | ICD-10-CM | POA: Diagnosis not present

## 2020-07-19 DIAGNOSIS — Z4432 Encounter for fitting and adjustment of external left breast prosthesis: Secondary | ICD-10-CM | POA: Diagnosis not present

## 2020-07-19 DIAGNOSIS — C50112 Malignant neoplasm of central portion of left female breast: Secondary | ICD-10-CM | POA: Diagnosis not present

## 2020-07-20 DIAGNOSIS — J301 Allergic rhinitis due to pollen: Secondary | ICD-10-CM | POA: Diagnosis not present

## 2020-07-20 DIAGNOSIS — J3081 Allergic rhinitis due to animal (cat) (dog) hair and dander: Secondary | ICD-10-CM | POA: Diagnosis not present

## 2020-07-20 DIAGNOSIS — J3089 Other allergic rhinitis: Secondary | ICD-10-CM | POA: Diagnosis not present

## 2020-07-28 DIAGNOSIS — E042 Nontoxic multinodular goiter: Secondary | ICD-10-CM | POA: Diagnosis not present

## 2020-07-28 DIAGNOSIS — C50919 Malignant neoplasm of unspecified site of unspecified female breast: Secondary | ICD-10-CM | POA: Diagnosis not present

## 2020-07-28 DIAGNOSIS — E063 Autoimmune thyroiditis: Secondary | ICD-10-CM | POA: Diagnosis not present

## 2020-07-28 DIAGNOSIS — L209 Atopic dermatitis, unspecified: Secondary | ICD-10-CM | POA: Diagnosis not present

## 2020-07-28 DIAGNOSIS — K219 Gastro-esophageal reflux disease without esophagitis: Secondary | ICD-10-CM | POA: Diagnosis not present

## 2020-07-28 DIAGNOSIS — I10 Essential (primary) hypertension: Secondary | ICD-10-CM | POA: Diagnosis not present

## 2020-07-28 DIAGNOSIS — I493 Ventricular premature depolarization: Secondary | ICD-10-CM | POA: Diagnosis not present

## 2020-07-28 DIAGNOSIS — E6609 Other obesity due to excess calories: Secondary | ICD-10-CM | POA: Diagnosis not present

## 2020-07-29 DIAGNOSIS — J301 Allergic rhinitis due to pollen: Secondary | ICD-10-CM | POA: Diagnosis not present

## 2020-07-29 DIAGNOSIS — J3089 Other allergic rhinitis: Secondary | ICD-10-CM | POA: Diagnosis not present

## 2020-07-29 DIAGNOSIS — J3081 Allergic rhinitis due to animal (cat) (dog) hair and dander: Secondary | ICD-10-CM | POA: Diagnosis not present

## 2020-08-04 ENCOUNTER — Other Ambulatory Visit: Payer: Self-pay | Admitting: Internal Medicine

## 2020-08-04 DIAGNOSIS — I10 Essential (primary) hypertension: Secondary | ICD-10-CM

## 2020-08-05 DIAGNOSIS — J3081 Allergic rhinitis due to animal (cat) (dog) hair and dander: Secondary | ICD-10-CM | POA: Diagnosis not present

## 2020-08-05 DIAGNOSIS — J3089 Other allergic rhinitis: Secondary | ICD-10-CM | POA: Diagnosis not present

## 2020-08-05 DIAGNOSIS — J301 Allergic rhinitis due to pollen: Secondary | ICD-10-CM | POA: Diagnosis not present

## 2020-08-09 DIAGNOSIS — K219 Gastro-esophageal reflux disease without esophagitis: Secondary | ICD-10-CM | POA: Diagnosis not present

## 2020-08-09 DIAGNOSIS — E063 Autoimmune thyroiditis: Secondary | ICD-10-CM | POA: Diagnosis not present

## 2020-08-09 DIAGNOSIS — L209 Atopic dermatitis, unspecified: Secondary | ICD-10-CM | POA: Diagnosis not present

## 2020-08-09 DIAGNOSIS — I493 Ventricular premature depolarization: Secondary | ICD-10-CM | POA: Diagnosis not present

## 2020-08-09 DIAGNOSIS — Z6833 Body mass index (BMI) 33.0-33.9, adult: Secondary | ICD-10-CM | POA: Diagnosis not present

## 2020-08-09 DIAGNOSIS — I10 Essential (primary) hypertension: Secondary | ICD-10-CM | POA: Diagnosis not present

## 2020-08-09 DIAGNOSIS — E6609 Other obesity due to excess calories: Secondary | ICD-10-CM | POA: Diagnosis not present

## 2020-08-09 DIAGNOSIS — C50919 Malignant neoplasm of unspecified site of unspecified female breast: Secondary | ICD-10-CM | POA: Diagnosis not present

## 2020-08-09 DIAGNOSIS — E042 Nontoxic multinodular goiter: Secondary | ICD-10-CM | POA: Diagnosis not present

## 2020-08-10 DIAGNOSIS — J3089 Other allergic rhinitis: Secondary | ICD-10-CM | POA: Diagnosis not present

## 2020-08-10 DIAGNOSIS — J301 Allergic rhinitis due to pollen: Secondary | ICD-10-CM | POA: Diagnosis not present

## 2020-08-10 DIAGNOSIS — J3081 Allergic rhinitis due to animal (cat) (dog) hair and dander: Secondary | ICD-10-CM | POA: Diagnosis not present

## 2020-08-17 DIAGNOSIS — J4530 Mild persistent asthma, uncomplicated: Secondary | ICD-10-CM | POA: Diagnosis not present

## 2020-08-17 DIAGNOSIS — J3081 Allergic rhinitis due to animal (cat) (dog) hair and dander: Secondary | ICD-10-CM | POA: Diagnosis not present

## 2020-08-17 DIAGNOSIS — J301 Allergic rhinitis due to pollen: Secondary | ICD-10-CM | POA: Diagnosis not present

## 2020-08-17 DIAGNOSIS — J3089 Other allergic rhinitis: Secondary | ICD-10-CM | POA: Diagnosis not present

## 2020-08-24 DIAGNOSIS — J3081 Allergic rhinitis due to animal (cat) (dog) hair and dander: Secondary | ICD-10-CM | POA: Diagnosis not present

## 2020-08-24 DIAGNOSIS — J301 Allergic rhinitis due to pollen: Secondary | ICD-10-CM | POA: Diagnosis not present

## 2020-08-24 DIAGNOSIS — J3089 Other allergic rhinitis: Secondary | ICD-10-CM | POA: Diagnosis not present

## 2020-08-31 DIAGNOSIS — J3081 Allergic rhinitis due to animal (cat) (dog) hair and dander: Secondary | ICD-10-CM | POA: Diagnosis not present

## 2020-08-31 DIAGNOSIS — J3089 Other allergic rhinitis: Secondary | ICD-10-CM | POA: Diagnosis not present

## 2020-08-31 DIAGNOSIS — J301 Allergic rhinitis due to pollen: Secondary | ICD-10-CM | POA: Diagnosis not present

## 2020-09-03 ENCOUNTER — Other Ambulatory Visit: Payer: Self-pay | Admitting: Internal Medicine

## 2020-09-07 DIAGNOSIS — J301 Allergic rhinitis due to pollen: Secondary | ICD-10-CM | POA: Diagnosis not present

## 2020-09-07 DIAGNOSIS — J3081 Allergic rhinitis due to animal (cat) (dog) hair and dander: Secondary | ICD-10-CM | POA: Diagnosis not present

## 2020-09-07 DIAGNOSIS — J3089 Other allergic rhinitis: Secondary | ICD-10-CM | POA: Diagnosis not present

## 2020-09-14 DIAGNOSIS — J3089 Other allergic rhinitis: Secondary | ICD-10-CM | POA: Diagnosis not present

## 2020-09-14 DIAGNOSIS — J301 Allergic rhinitis due to pollen: Secondary | ICD-10-CM | POA: Diagnosis not present

## 2020-09-14 DIAGNOSIS — J3081 Allergic rhinitis due to animal (cat) (dog) hair and dander: Secondary | ICD-10-CM | POA: Diagnosis not present

## 2020-09-28 DIAGNOSIS — J3081 Allergic rhinitis due to animal (cat) (dog) hair and dander: Secondary | ICD-10-CM | POA: Diagnosis not present

## 2020-09-28 DIAGNOSIS — J3089 Other allergic rhinitis: Secondary | ICD-10-CM | POA: Diagnosis not present

## 2020-09-28 DIAGNOSIS — J301 Allergic rhinitis due to pollen: Secondary | ICD-10-CM | POA: Diagnosis not present

## 2020-10-05 DIAGNOSIS — J3089 Other allergic rhinitis: Secondary | ICD-10-CM | POA: Diagnosis not present

## 2020-10-05 DIAGNOSIS — J301 Allergic rhinitis due to pollen: Secondary | ICD-10-CM | POA: Diagnosis not present

## 2020-10-05 DIAGNOSIS — J3081 Allergic rhinitis due to animal (cat) (dog) hair and dander: Secondary | ICD-10-CM | POA: Diagnosis not present

## 2020-10-19 ENCOUNTER — Other Ambulatory Visit: Payer: Self-pay | Admitting: Nurse Practitioner

## 2020-10-19 DIAGNOSIS — D0512 Intraductal carcinoma in situ of left breast: Secondary | ICD-10-CM

## 2020-10-26 DIAGNOSIS — J301 Allergic rhinitis due to pollen: Secondary | ICD-10-CM | POA: Diagnosis not present

## 2020-10-26 DIAGNOSIS — J3081 Allergic rhinitis due to animal (cat) (dog) hair and dander: Secondary | ICD-10-CM | POA: Diagnosis not present

## 2020-10-26 DIAGNOSIS — J3089 Other allergic rhinitis: Secondary | ICD-10-CM | POA: Diagnosis not present

## 2020-11-02 DIAGNOSIS — J3081 Allergic rhinitis due to animal (cat) (dog) hair and dander: Secondary | ICD-10-CM | POA: Diagnosis not present

## 2020-11-02 DIAGNOSIS — J3089 Other allergic rhinitis: Secondary | ICD-10-CM | POA: Diagnosis not present

## 2020-11-02 DIAGNOSIS — J301 Allergic rhinitis due to pollen: Secondary | ICD-10-CM | POA: Diagnosis not present

## 2020-11-16 ENCOUNTER — Other Ambulatory Visit: Payer: Self-pay | Admitting: Nurse Practitioner

## 2020-11-16 ENCOUNTER — Other Ambulatory Visit: Payer: Self-pay | Admitting: Internal Medicine

## 2020-11-16 ENCOUNTER — Other Ambulatory Visit: Payer: Self-pay

## 2020-11-16 DIAGNOSIS — D0512 Intraductal carcinoma in situ of left breast: Secondary | ICD-10-CM

## 2020-11-16 DIAGNOSIS — J3081 Allergic rhinitis due to animal (cat) (dog) hair and dander: Secondary | ICD-10-CM | POA: Diagnosis not present

## 2020-11-16 DIAGNOSIS — J3089 Other allergic rhinitis: Secondary | ICD-10-CM | POA: Diagnosis not present

## 2020-11-16 DIAGNOSIS — J301 Allergic rhinitis due to pollen: Secondary | ICD-10-CM | POA: Diagnosis not present

## 2020-11-16 DIAGNOSIS — I10 Essential (primary) hypertension: Secondary | ICD-10-CM

## 2020-11-16 MED ORDER — TELMISARTAN 20 MG PO TABS: ORAL_TABLET | ORAL | 5 refills | Status: DC

## 2020-11-23 DIAGNOSIS — J3089 Other allergic rhinitis: Secondary | ICD-10-CM | POA: Diagnosis not present

## 2020-11-23 DIAGNOSIS — J301 Allergic rhinitis due to pollen: Secondary | ICD-10-CM | POA: Diagnosis not present

## 2020-11-23 DIAGNOSIS — J3081 Allergic rhinitis due to animal (cat) (dog) hair and dander: Secondary | ICD-10-CM | POA: Diagnosis not present

## 2020-11-30 DIAGNOSIS — J301 Allergic rhinitis due to pollen: Secondary | ICD-10-CM | POA: Diagnosis not present

## 2020-11-30 DIAGNOSIS — J3089 Other allergic rhinitis: Secondary | ICD-10-CM | POA: Diagnosis not present

## 2020-11-30 DIAGNOSIS — J3081 Allergic rhinitis due to animal (cat) (dog) hair and dander: Secondary | ICD-10-CM | POA: Diagnosis not present

## 2020-12-07 DIAGNOSIS — J301 Allergic rhinitis due to pollen: Secondary | ICD-10-CM | POA: Diagnosis not present

## 2020-12-07 DIAGNOSIS — J3089 Other allergic rhinitis: Secondary | ICD-10-CM | POA: Diagnosis not present

## 2020-12-07 DIAGNOSIS — J3081 Allergic rhinitis due to animal (cat) (dog) hair and dander: Secondary | ICD-10-CM | POA: Diagnosis not present

## 2020-12-08 ENCOUNTER — Encounter: Payer: Self-pay | Admitting: Nurse Practitioner

## 2020-12-08 ENCOUNTER — Other Ambulatory Visit: Payer: Self-pay

## 2020-12-08 ENCOUNTER — Ambulatory Visit (INDEPENDENT_AMBULATORY_CARE_PROVIDER_SITE_OTHER): Payer: PPO | Admitting: Nurse Practitioner

## 2020-12-08 VITALS — BP 145/74 | HR 80 | Temp 98.6°F | Resp 16 | Ht 60.0 in | Wt 162.4 lb

## 2020-12-08 DIAGNOSIS — E559 Vitamin D deficiency, unspecified: Secondary | ICD-10-CM

## 2020-12-08 DIAGNOSIS — R7303 Prediabetes: Secondary | ICD-10-CM | POA: Diagnosis not present

## 2020-12-08 DIAGNOSIS — R5383 Other fatigue: Secondary | ICD-10-CM | POA: Diagnosis not present

## 2020-12-08 DIAGNOSIS — Z9049 Acquired absence of other specified parts of digestive tract: Secondary | ICD-10-CM | POA: Diagnosis not present

## 2020-12-08 DIAGNOSIS — E782 Mixed hyperlipidemia: Secondary | ICD-10-CM

## 2020-12-08 DIAGNOSIS — J4520 Mild intermittent asthma, uncomplicated: Secondary | ICD-10-CM

## 2020-12-08 DIAGNOSIS — I10 Essential (primary) hypertension: Secondary | ICD-10-CM | POA: Diagnosis not present

## 2020-12-08 DIAGNOSIS — Z0001 Encounter for general adult medical examination with abnormal findings: Secondary | ICD-10-CM | POA: Diagnosis not present

## 2020-12-08 DIAGNOSIS — R300 Dysuria: Secondary | ICD-10-CM | POA: Diagnosis not present

## 2020-12-08 DIAGNOSIS — K219 Gastro-esophageal reflux disease without esophagitis: Secondary | ICD-10-CM

## 2020-12-08 DIAGNOSIS — E538 Deficiency of other specified B group vitamins: Secondary | ICD-10-CM

## 2020-12-08 MED ORDER — AMLODIPINE BESYLATE 5 MG PO TABS: 5.0000 mg | ORAL_TABLET | Freq: Every day | ORAL | 1 refills | Status: DC

## 2020-12-08 NOTE — Progress Notes (Signed)
[facility] [place], [place] [address]  Internal MEDICINE  Office Visit Note  Patient Name: Stacie Ellis  [id number]  [id number]  Date of Service: 12/08/2020  Chief Complaint  Patient presents with   Medicare Wellness    Discuss BP, discuss voice    Gastroesophageal Reflux   Asthma   Hypertension    HPI Leira presents for an annual well visit and physical exam. she has a history of hypertension, asthma, allergic rhinitis, GERD, prediabetes, hyperlipidemia, vitamin D deficiency. She has not had the covid vaccine and is not interested in getting the vaccine. She has also declined other recommended vaccinations. She lives at home with spouse. She is due for routine mammogram and BMD screening. She is due for routine labs. Her A1C was 5.6 the last 2 times it was checked which is high normal.    Current Medication: Outpatient Encounter Medications as of 12/08/2020  Medication Sig   albuterol (PROVENTIL HFA;VENTOLIN HFA) 108 (90 Base) MCG/ACT inhaler Inhale 2 puffs into the lungs every 6 (six) hours as needed for wheezing or shortness of breath.   aspirin 81 MG tablet Take 81 mg by mouth daily.   budesonide-formoterol (SYMBICORT) 80-4.5 MCG/ACT inhaler Inhale 2 puffs into the lungs 2 (two) times daily.   carvedilol (COREG) 25 MG tablet TAKE 1 TABLET BY MOUTH TWICE DAILY WITH A MEAL   loratadine (CLARITIN) 10 MG tablet Take 10 mg by mouth daily.   montelukast (SINGULAIR) 10 MG tablet Take 1 tablet (10 mg total) by mouth at bedtime.   pantoprazole (PROTONIX) 40 MG tablet TAKE 1 TABLET(40 MG) BY MOUTH DAILY   [DISCONTINUED] amLODipine (NORVASC) 2.5 MG tablet TAKE 1 TABLET BY MOUTH TWICE DAILY   [DISCONTINUED] amLODipine (NORVASC) 5 MG tablet Take 1 tablet (5 mg total) by mouth daily.   [DISCONTINUED] hydrochlorothiazide (HYDRODIURIL) 25 MG tablet Take 1 tablet (25 mg total) by mouth every morning.   [DISCONTINUED] telmisartan (MICARDIS) 20 MG tablet TAKE 1  TABLET(20 MG) BY MOUTH DAILY   amLODipine (NORVASC) 2.5 MG tablet Take 1 tablet (2.5 mg total) by mouth 2 (two) times daily.   telmisartan (MICARDIS) 20 MG tablet TAKE 1 TABLET(20 MG) BY MOUTH DAILY   No facility-administered encounter medications on file as of 12/08/2020.    Surgical History: Past Surgical History:  Procedure Laterality Date   APPENDECTOMY  2008   BREAST BIOPSY Left 2011   pt states had stereo done on mobile unit at Dr. Dwyane Luo office. DCIS   BREAST LUMPECTOMY Left 02/2010   lumpectomy with re excision 03/2010 of left breast for cancer and rad tx   BREAST SURGERY Left 02/17/2010;03/10/2010   lumpectomy and repeat wide excision   CESAREAN SECTION     COLON SURGERY Right 06/13/2006   Hand-assisted right hemicolectomy. 5.8 cm villous adenoma without atypia   COLONOSCOPY  2015   Normal exam.   COLONOSCOPY WITH PROPOFOL N/A 05/29/2019   Procedure: COLONOSCOPY WITH PROPOFOL;  Surgeon: Lucilla Lame, MD;  Location: [facility] ENDOSCOPY;  Service: Endoscopy;  Laterality: N/A;   ESOPHAGOGASTRODUODENOSCOPY (EGD) WITH PROPOFOL N/A 06/21/2015   Procedure: ESOPHAGOGASTRODUODENOSCOPY (EGD) WITH PROPOFOL;  Surgeon: Hulen Luster, MD;  Location: [facility] ENDOSCOPY;  Service: Gastroenterology;  Laterality: N/A;    Medical History: Past Medical History:  Diagnosis Date   Asthma    Breast cancer ([place]) 02/2010   GERD (gastroesophageal reflux disease) 2008   Hypertension    Malignant neoplasm of upper-outer quadrant of female breast ([place]) 2011  Intermediate grade DCIS, ER: 50%; PR 10%. left breast lumpectomy, wide excision and a repeat wide excision on 03/10/2010 for multiple positive margins on original resection   Malignant neoplasm of upper-outer quadrant of female breast [facility]) January 2012:    Re-excision to negative margins.    Obesity, unspecified    Postmenopausal bleeding 2013   S/P radiation therapy 2012   whole breast,    Special screening for malignant neoplasms, colon     Family  History: Family History  Problem Relation Age of Onset   Cancer Father        colon   Cancer Other        unknown family member with breast cancer   Colon polyps Other        unknown family member with colon polyps    Social History   Socioeconomic History   Marital status: Married    Spouse name: Not on file   Number of children: Not on file   Years of education: Not on file   Highest education level: Not on file  Occupational History   Not on file  Tobacco Use   Smoking status: Never   Smokeless tobacco: Never  Vaping Use   Vaping Use: Never used  Substance and Sexual Activity   Alcohol use: No   Drug use: No   Sexual activity: Yes    Birth control/protection: Post-menopausal  Other Topics Concern   Not on file  Social History Narrative   Not on file   Social Determinants of Health   Financial Resource Strain: Not on file  Food Insecurity: Not on file  Transportation Needs: Not on file  Physical Activity: Not on file  Stress: Not on file  Social Connections: Not on file  Intimate Partner Violence: Not on file      Review of Systems  Constitutional:  Negative for activity change, appetite change, chills, fatigue, fever and unexpected weight change.  HENT: Negative.  Negative for congestion, ear pain, rhinorrhea, sore throat and trouble swallowing.   Eyes: Negative.   Respiratory: Negative.  Negative for cough, chest tightness, shortness of breath and wheezing.   Cardiovascular: Negative.  Negative for chest pain.  Gastrointestinal: Negative.  Negative for abdominal pain, blood in stool, constipation, diarrhea, nausea and vomiting.  Endocrine: Negative.   Genitourinary: Negative.  Negative for difficulty urinating, dysuria, frequency, hematuria and urgency.  Musculoskeletal: Negative.  Negative for arthralgias, back pain, joint swelling, myalgias and neck pain.  Skin: Negative.  Negative for rash and wound.  Allergic/Immunologic: Negative.  Negative for  immunocompromised state.  Neurological: Negative.  Negative for dizziness, seizures, numbness and headaches.  Hematological: Negative.   Psychiatric/Behavioral: Negative.  Negative for behavioral problems, self-injury and suicidal ideas. The patient is not nervous/anxious.    Vital Signs: BP (!) 145/74   Pulse 80   Temp 98.6 F (37 C)   Resp 16   Ht 5' (1.524 m)   Wt 162 lb 6.4 oz (73.7 kg)   SpO2 98%   BMI 31.72 kg/m    Physical Exam Vitals reviewed.  Constitutional:      General: She is not in acute distress.    Appearance: She is well-developed. She is not diaphoretic.  HENT:     Head: Normocephalic and atraumatic.     Right Ear: External ear normal.     Left Ear: External ear normal.     Nose: Nose normal.     Mouth/Throat:     Pharynx: No oropharyngeal exudate.  Eyes:     General: No scleral icterus.       Right eye: No discharge.        Left eye: No discharge.     Conjunctiva/sclera: Conjunctivae normal.     Pupils: Pupils are equal, round, and reactive to light.  Neck:     Thyroid: No thyromegaly.     Vascular: No JVD.     Trachea: No tracheal deviation.  Cardiovascular:     Rate and Rhythm: Normal rate and regular rhythm.     Heart sounds: Normal heart sounds. No murmur heard.   No friction rub. No gallop.  Pulmonary:     Effort: Pulmonary effort is normal. No respiratory distress.     Breath sounds: Normal breath sounds. No stridor. No wheezing or rales.  Chest:     Chest wall: No tenderness.  Abdominal:     General: Bowel sounds are normal. There is no distension.     Palpations: Abdomen is soft. There is no mass.     Tenderness: There is no abdominal tenderness. There is no guarding or rebound.  Musculoskeletal:        General: No tenderness or deformity. Normal range of motion.     Cervical back: Normal range of motion and neck supple.  Lymphadenopathy:     Cervical: No cervical adenopathy.  Skin:    General: Skin is warm and dry.      Coloration: Skin is not pale.     Findings: No erythema or rash.  Neurological:     Mental Status: She is alert.     Cranial Nerves: No cranial nerve deficit.     Motor: No abnormal muscle tone.     Coordination: Coordination normal.     Deep Tendon Reflexes: Reflexes are normal and symmetric.  Psychiatric:        Behavior: Behavior normal.        Thought Content: Thought content normal.        Judgment: Judgment normal.       Assessment/Plan: 1. Encounter for general adult medical examination with abnormal findings Age-appropriate preventive screenings and vaccinations discussed, annual physical exam completed. Routine labs for health maintenance ordered, see below. PHM updated.   2. Pre-diabetes Routine labs ordered. A1C checked in office today - CMP14+EGFR - TSH + free T4 - POCT glycosylated hemoglobin (Hb A1C)  3. Essential hypertension Briefly tried to increase dose of amlodipine and stop telmisartan but patient did not like the change due to increase edema and elevated blood pressure. Medications were changed back to what she was originally taking.  - amLODipine (NORVASC) 2.5 MG tablet; Take 1 tablet (2.5 mg total) by mouth 2 (two) times daily.  Dispense: 180 tablet; Refill: 1  4. Mild intermittent asthma without complication Asthma currently stable, does not use rescue inhaler often.   5. Mixed hyperlipidemia Routine lab ordered.  - Lipid Profile  6. Gastroesophageal reflux disease without esophagitis Stable with current medications.   7. Vitamin D deficiency Routine lab ordered.  - Vitamin D (25 hydroxy)  8. B12 deficiency Routine labs ordered.  - CBC with Differential/Platelet - B12 and Folate Panel  9. S/P partial resection of colon Routine labs and labs requested by patient ordered.  - CBC with Differential/Platelet - Iron, TIBC and Ferritin Panel - C-reactive protein - Sed Rate (ESR)  10. Other fatigue Routine labs and labs requested by patient  ordered.  - CMP14+EGFR - TSH + free T4 - Magnesium, RBC  11. Dysuria  Routine urinalysis done  - UA/M w/rflx Culture, Routine     General Counseling: rhonna holster understanding of the findings of todays visit and agrees with plan of treatment. I have discussed any further diagnostic evaluation that may be needed or ordered today. We also reviewed her medications today. she has been encouraged to call the office with any questions or concerns that should arise related to todays visit.    Orders Placed This Encounter  Procedures   Microscopic Examination   CBC with Differential/Platelet   CMP14+EGFR   Lipid Profile   TSH + free T4   Vitamin D (25 hydroxy)   Magnesium, RBC   Iron, TIBC and Ferritin Panel   C-reactive protein   Sed Rate (ESR)   B12 and Folate Panel   UA/M w/rflx Culture, Routine   POCT glycosylated hemoglobin (Hb A1C)    Meds ordered this encounter  Medications   DISCONTD: amLODipine (NORVASC) 5 MG tablet    Sig: Take 1 tablet (5 mg total) by mouth daily.    Dispense:  60 tablet    Refill:  1    Note increased dose, discontinued amlodipine 2.5 mg   telmisartan (MICARDIS) 20 MG tablet    Sig: TAKE 1 TABLET(20 MG) BY MOUTH DAILY    Dispense:  30 tablet    Refill:  5   amLODipine (NORVASC) 2.5 MG tablet    Sig: Take 1 tablet (2.5 mg total) by mouth 2 (two) times daily.    Dispense:  180 tablet    Refill:  1    Note dose change. 5 mg amlodipine dose discontinued    Return in about 6 weeks (around 01/19/2021) for F/U, BP check, Aylinn Rydberg PCP.   Total time spent: 30 Minutes Time spent includes review of chart, medications, test results, and follow up plan with the patient.   [place] Controlled Substance Database was reviewed by me.  This patient was seen by Jonetta Osgood, FNP-C in collaboration with Dr. Clayborn Bigness as a part of collaborative care agreement.  Beverely Suen R. Valetta Fuller, MSN, FNP-C Internal medicine

## 2020-12-09 LAB — UA/M W/RFLX CULTURE, ROUTINE
Bilirubin, UA: NEGATIVE
Glucose, UA: NEGATIVE
Ketones, UA: NEGATIVE
Leukocytes,UA: NEGATIVE
Nitrite, UA: NEGATIVE
Protein,UA: NEGATIVE
RBC, UA: NEGATIVE
Specific Gravity, UA: 1.02 (ref 1.005–1.030)
Urobilinogen, Ur: 0.2 mg/dL (ref 0.2–1.0)
pH, UA: 7 (ref 5.0–7.5)

## 2020-12-09 LAB — MICROSCOPIC EXAMINATION
Bacteria, UA: NONE SEEN
Casts: NONE SEEN /lpf
RBC, Urine: NONE SEEN /hpf (ref 0–2)
WBC, UA: NONE SEEN /hpf (ref 0–5)

## 2020-12-10 LAB — CBC WITH DIFFERENTIAL/PLATELET
Basophils Absolute: 0 10*3/uL (ref 0.0–0.2)
Basos: 1 %
EOS (ABSOLUTE): 0.2 10*3/uL (ref 0.0–0.4)
Eos: 3 %
Hematocrit: 40 % (ref 34.0–46.6)
Hemoglobin: 13.4 g/dL (ref 11.1–15.9)
Immature Grans (Abs): 0 10*3/uL (ref 0.0–0.1)
Immature Granulocytes: 0 %
Lymphocytes Absolute: 1.9 10*3/uL (ref 0.7–3.1)
Lymphs: 37 %
MCH: 28 pg (ref 26.6–33.0)
MCHC: 33.5 g/dL (ref 31.5–35.7)
MCV: 84 fL (ref 79–97)
Monocytes Absolute: 0.5 10*3/uL (ref 0.1–0.9)
Monocytes: 10 %
Neutrophils Absolute: 2.6 10*3/uL (ref 1.4–7.0)
Neutrophils: 49 %
Platelets: 219 10*3/uL (ref 150–450)
RBC: 4.78 x10E6/uL (ref 3.77–5.28)
RDW: 13.8 % (ref 11.7–15.4)
WBC: 5.3 10*3/uL (ref 3.4–10.8)

## 2020-12-10 LAB — SEDIMENTATION RATE: Sed Rate: 36 mm/hr (ref 0–40)

## 2020-12-10 LAB — LIPID PANEL
Chol/HDL Ratio: 3.7 ratio (ref 0.0–4.4)
Cholesterol, Total: 229 mg/dL — ABNORMAL HIGH (ref 100–199)
HDL: 62 mg/dL (ref >39)
LDL Chol Calc (NIH): 145 mg/dL — ABNORMAL HIGH (ref 0–99)
Triglycerides: 126 mg/dL (ref 0–149)
VLDL Cholesterol Cal: 22 mg/dL (ref 5–40)

## 2020-12-10 LAB — CMP14+EGFR
ALT: 14 IU/L (ref 0–32)
AST: 21 IU/L (ref 0–40)
Albumin/Globulin Ratio: 1.6 (ref 1.2–2.2)
Albumin: 4.4 g/dL (ref 3.8–4.8)
Alkaline Phosphatase: 94 IU/L (ref 44–121)
BUN/Creatinine Ratio: 20 (ref 12–28)
BUN: 13 mg/dL (ref 8–27)
Bilirubin Total: 0.6 mg/dL (ref 0.0–1.2)
CO2: 27 mmol/L (ref 20–29)
Calcium: 9.6 mg/dL (ref 8.7–10.3)
Chloride: 99 mmol/L (ref 96–106)
Creatinine, Ser: 0.66 mg/dL (ref 0.57–1.00)
Globulin, Total: 2.7 g/dL (ref 1.5–4.5)
Glucose: 90 mg/dL (ref 70–99)
Potassium: 4.1 mmol/L (ref 3.5–5.2)
Sodium: 141 mmol/L (ref 134–144)
Total Protein: 7.1 g/dL (ref 6.0–8.5)
eGFR: 97 mL/min/{1.73_m2} (ref >59)

## 2020-12-10 LAB — B12 AND FOLATE PANEL
Folate: 12.3 ng/mL (ref >3.0)
Vitamin B-12: 751 pg/mL (ref 232–1245)

## 2020-12-10 LAB — TSH+FREE T4
Free T4: 1.37 ng/dL (ref 0.82–1.77)
TSH: 1.59 u[IU]/mL (ref 0.450–4.500)

## 2020-12-10 LAB — MAGNESIUM, RBC: Magnesium RBC: 5.7 mg/dL (ref 4.2–6.8)

## 2020-12-10 LAB — IRON,TIBC AND FERRITIN PANEL
Ferritin: 19 ng/mL (ref 15–150)
Iron Saturation: 19 % (ref 15–55)
Iron: 87 ug/dL (ref 27–139)
Total Iron Binding Capacity: 455 ug/dL — ABNORMAL HIGH (ref 250–450)
UIBC: 368 ug/dL (ref 118–369)

## 2020-12-10 LAB — C-REACTIVE PROTEIN: CRP: 9 mg/L (ref 0–10)

## 2020-12-10 LAB — VITAMIN D 25 HYDROXY (VIT D DEFICIENCY, FRACTURES): Vit D, 25-Hydroxy: 94.7 ng/mL (ref 30.0–100.0)

## 2020-12-14 DIAGNOSIS — J3081 Allergic rhinitis due to animal (cat) (dog) hair and dander: Secondary | ICD-10-CM | POA: Diagnosis not present

## 2020-12-14 DIAGNOSIS — J301 Allergic rhinitis due to pollen: Secondary | ICD-10-CM | POA: Diagnosis not present

## 2020-12-14 DIAGNOSIS — J3089 Other allergic rhinitis: Secondary | ICD-10-CM | POA: Diagnosis not present

## 2020-12-16 ENCOUNTER — Other Ambulatory Visit: Payer: Self-pay | Admitting: Nurse Practitioner

## 2020-12-16 DIAGNOSIS — D0512 Intraductal carcinoma in situ of left breast: Secondary | ICD-10-CM

## 2020-12-17 ENCOUNTER — Other Ambulatory Visit: Payer: Self-pay | Admitting: Internal Medicine

## 2020-12-17 DIAGNOSIS — D0512 Intraductal carcinoma in situ of left breast: Secondary | ICD-10-CM

## 2020-12-24 ENCOUNTER — Telehealth: Payer: Self-pay

## 2020-12-26 MED ORDER — TELMISARTAN 20 MG PO TABS: ORAL_TABLET | ORAL | 5 refills | Status: DC

## 2020-12-26 MED ORDER — AMLODIPINE BESYLATE 2.5 MG PO TABS: 2.5000 mg | ORAL_TABLET | Freq: Two times a day (BID) | ORAL | 1 refills | Status: DC

## 2020-12-27 NOTE — Telephone Encounter (Signed)
LMOM letting pt know that Alyssa made some changes to her meds

## 2020-12-29 NOTE — Progress Notes (Signed)
I have reviewed the lab results. There are no critically abnormal values requiring immediate intervention but there are some abnormals that will be discussed at the next office visit.  

## 2021-01-18 DIAGNOSIS — J3089 Other allergic rhinitis: Secondary | ICD-10-CM | POA: Diagnosis not present

## 2021-01-18 DIAGNOSIS — J3081 Allergic rhinitis due to animal (cat) (dog) hair and dander: Secondary | ICD-10-CM | POA: Diagnosis not present

## 2021-01-18 DIAGNOSIS — J301 Allergic rhinitis due to pollen: Secondary | ICD-10-CM | POA: Diagnosis not present

## 2021-01-19 ENCOUNTER — Ambulatory Visit: Payer: PPO | Admitting: Nurse Practitioner

## 2021-01-25 DIAGNOSIS — J3089 Other allergic rhinitis: Secondary | ICD-10-CM | POA: Diagnosis not present

## 2021-01-25 DIAGNOSIS — J301 Allergic rhinitis due to pollen: Secondary | ICD-10-CM | POA: Diagnosis not present

## 2021-01-25 DIAGNOSIS — J3081 Allergic rhinitis due to animal (cat) (dog) hair and dander: Secondary | ICD-10-CM | POA: Diagnosis not present

## 2021-01-29 ENCOUNTER — Other Ambulatory Visit: Payer: Self-pay | Admitting: Internal Medicine

## 2021-02-01 DIAGNOSIS — J3089 Other allergic rhinitis: Secondary | ICD-10-CM | POA: Diagnosis not present

## 2021-02-01 DIAGNOSIS — J3081 Allergic rhinitis due to animal (cat) (dog) hair and dander: Secondary | ICD-10-CM | POA: Diagnosis not present

## 2021-02-01 DIAGNOSIS — J301 Allergic rhinitis due to pollen: Secondary | ICD-10-CM | POA: Diagnosis not present

## 2021-02-07 ENCOUNTER — Ambulatory Visit (INDEPENDENT_AMBULATORY_CARE_PROVIDER_SITE_OTHER): Payer: PPO | Admitting: Nurse Practitioner

## 2021-02-07 ENCOUNTER — Other Ambulatory Visit: Payer: Self-pay | Admitting: Nurse Practitioner

## 2021-02-07 ENCOUNTER — Other Ambulatory Visit: Payer: Self-pay

## 2021-02-07 ENCOUNTER — Encounter: Payer: Self-pay | Admitting: Nurse Practitioner

## 2021-02-07 VITALS — BP 156/78 | HR 93 | Temp 98.3°F | Resp 16 | Ht 59.0 in | Wt 163.4 lb

## 2021-02-07 DIAGNOSIS — J4520 Mild intermittent asthma, uncomplicated: Secondary | ICD-10-CM

## 2021-02-07 DIAGNOSIS — E782 Mixed hyperlipidemia: Secondary | ICD-10-CM | POA: Diagnosis not present

## 2021-02-07 DIAGNOSIS — I10 Essential (primary) hypertension: Secondary | ICD-10-CM

## 2021-02-07 DIAGNOSIS — R7303 Prediabetes: Secondary | ICD-10-CM | POA: Diagnosis not present

## 2021-02-07 MED ORDER — LISINOPRIL 10 MG PO TABS: 10.0000 mg | ORAL_TABLET | Freq: Every day | ORAL | 1 refills | Status: DC

## 2021-02-07 NOTE — Progress Notes (Signed)
[facility] [place], [place] [address]  Internal MEDICINE  Office Visit Note  Patient Name: Stacie Ellis  [id number]  [id number]  Date of Service: 02/07/2021  Chief Complaint  Patient presents with   Follow-up    Discuss meds   Gastroesophageal Reflux   Hypertension   Asthma    HPI Kaysen presents for a follow up visit for medication review and to discuss lab results. Her labs are grossly normal except for abnormal lipid panel. Her blood pressure has remained elevated at home and is elevated today.     Current Medication: Outpatient Encounter Medications as of 02/07/2021  Medication Sig   albuterol (PROVENTIL HFA;VENTOLIN HFA) 108 (90 Base) MCG/ACT inhaler Inhale 2 puffs into the lungs every 6 (six) hours as needed for wheezing or shortness of breath.   amLODipine (NORVASC) 2.5 MG tablet Take 1 tablet (2.5 mg total) by mouth 2 (two) times daily.   aspirin 81 MG tablet Take 81 mg by mouth daily.   budesonide-formoterol (SYMBICORT) 80-4.5 MCG/ACT inhaler Inhale 2 puffs into the lungs 2 (two) times daily.   carvedilol (COREG) 25 MG tablet TAKE 1 TABLET BY MOUTH TWICE DAILY WITH A MEAL   hydrochlorothiazide (HYDRODIURIL) 25 MG tablet TAKE 1 TABLET(25 MG) BY MOUTH EVERY MORNING   loratadine (CLARITIN) 10 MG tablet Take 10 mg by mouth daily.   montelukast (SINGULAIR) 10 MG tablet Take 1 tablet (10 mg total) by mouth at bedtime.   pantoprazole (PROTONIX) 40 MG tablet TAKE 1 TABLET(40 MG) BY MOUTH DAILY   [DISCONTINUED] lisinopril (ZESTRIL) 10 MG tablet Take 1 tablet (10 mg total) by mouth daily.   [DISCONTINUED] telmisartan (MICARDIS) 20 MG tablet TAKE 1 TABLET(20 MG) BY MOUTH DAILY   No facility-administered encounter medications on file as of 02/07/2021.    Surgical History: Past Surgical History:  Procedure Laterality Date   APPENDECTOMY  2008   BREAST BIOPSY Left 2011   pt states had stereo done on mobile unit at Dr. Dwyane Luo office. DCIS    BREAST LUMPECTOMY Left 02/2010   lumpectomy with re excision 03/2010 of left breast for cancer and rad tx   BREAST SURGERY Left 02/17/2010;03/10/2010   lumpectomy and repeat wide excision   CESAREAN SECTION     COLON SURGERY Right 06/13/2006   Hand-assisted right hemicolectomy. 5.8 cm villous adenoma without atypia   COLONOSCOPY  2015   Normal exam.   COLONOSCOPY WITH PROPOFOL N/A 05/29/2019   Procedure: COLONOSCOPY WITH PROPOFOL;  Surgeon: Lucilla Lame, MD;  Location: [facility] ENDOSCOPY;  Service: Endoscopy;  Laterality: N/A;   ESOPHAGOGASTRODUODENOSCOPY (EGD) WITH PROPOFOL N/A 06/21/2015   Procedure: ESOPHAGOGASTRODUODENOSCOPY (EGD) WITH PROPOFOL;  Surgeon: Hulen Luster, MD;  Location: [facility] ENDOSCOPY;  Service: Gastroenterology;  Laterality: N/A;    Medical History: Past Medical History:  Diagnosis Date   Asthma    Breast cancer ([place]) 02/2010   GERD (gastroesophageal reflux disease) 2008   Hypertension    Malignant neoplasm of upper-outer quadrant of female breast ([place]) 2011   Intermediate grade DCIS, ER: 50%; PR 10%. left breast lumpectomy, wide excision and a repeat wide excision on 03/10/2010 for multiple positive margins on original resection   Malignant neoplasm of upper-outer quadrant of female breast [facility]) January 2012:    Re-excision to negative margins.    Obesity, unspecified    Postmenopausal bleeding 2013   S/P radiation therapy 2012   whole breast,    Special screening for malignant neoplasms, colon     Family  History: Family History  Problem Relation Age of Onset   Cancer Father        colon   Cancer Other        unknown family member with breast cancer   Colon polyps Other        unknown family member with colon polyps    Social History   Socioeconomic History   Marital status: Married    Spouse name: Not on file   Number of children: Not on file   Years of education: Not on file   Highest education level: Not on file  Occupational History   Not on file   Tobacco Use   Smoking status: Never   Smokeless tobacco: Never  Vaping Use   Vaping Use: Never used  Substance and Sexual Activity   Alcohol use: No   Drug use: No   Sexual activity: Yes    Birth control/protection: Post-menopausal  Other Topics Concern   Not on file  Social History Narrative   Not on file   Social Determinants of Health   Financial Resource Strain: Not on file  Food Insecurity: Not on file  Transportation Needs: Not on file  Physical Activity: Not on file  Stress: Not on file  Social Connections: Not on file  Intimate Partner Violence: Not on file      Review of Systems  Constitutional:  Negative for chills, fatigue and unexpected weight change.  HENT:  Negative for congestion, rhinorrhea, sneezing and sore throat.   Eyes:  Negative for redness.  Respiratory:  Negative for cough, chest tightness and shortness of breath.   Cardiovascular:  Negative for chest pain and palpitations.  Gastrointestinal:  Negative for abdominal pain, constipation, diarrhea, nausea and vomiting.  Genitourinary:  Negative for dysuria and frequency.  Musculoskeletal:  Negative for arthralgias, back pain, joint swelling and neck pain.  Skin:  Negative for rash.  Neurological: Negative.  Negative for tremors and numbness.  Hematological:  Negative for adenopathy. Does not bruise/bleed easily.  Psychiatric/Behavioral:  Negative for behavioral problems (Depression), sleep disturbance and suicidal ideas. The patient is not nervous/anxious.    Vital Signs: BP (!) 156/78   Pulse 93   Temp 98.3 F (36.8 C)   Resp 16   Ht 4\' 11"  (1.499 m)   Wt 163 lb 6.4 oz (74.1 kg)   SpO2 97%   BMI 33.00 kg/m    Physical Exam Vitals reviewed.  Constitutional:      Appearance: Normal appearance. She is normal weight.  HENT:     Head: Normocephalic and atraumatic.  Eyes:     Pupils: Pupils are equal, round, and reactive to light.  Cardiovascular:     Rate and Rhythm: Normal rate and  regular rhythm.  Pulmonary:     Effort: Pulmonary effort is normal. No respiratory distress.  Neurological:     Mental Status: She is alert and oriented to person, place, and time.     Cranial Nerves: No cranial nerve deficit.     Coordination: Coordination normal.     Gait: Gait normal.  Psychiatric:        Mood and Affect: Mood normal.        Behavior: Behavior normal.       Assessment/Plan: 1. Essential hypertension Lisinopril dose increased to 10 mg daily. She is also taking carvedilol, HCTZ and amlodipine. Follow up in 1 month  2. Mixed hyperlipidemia Lipid panel is abnormal. Not on any statin therapy. Discussed diet and lifestyle modifications and  adding a fish oil supplement.   3. Pre-diabetes Patient's last A1c was 5.6 in may this year. She was prediabetic in October 2021. Will repeat A1C at next office visit.   4. Mild intermittent asthma without complication Stable with symbicort as maintenance inhaler and has albuterol rescue inhaler as needed.    General Counseling: yachet mattson understanding of the findings of todays visit and agrees with plan of treatment. I have discussed any further diagnostic evaluation that may be needed or ordered today. We also reviewed her medications today. she has been encouraged to call the office with any questions or concerns that should arise related to todays visit.    No orders of the defined types were placed in this encounter.    Meds ordered this encounter  Medications   DISCONTD: lisinopril (ZESTRIL) 10 MG tablet    Sig: Take 1 tablet (10 mg total) by mouth daily.    Dispense:  30 tablet    Refill:  1     Return in about 4 weeks (around 03/07/2021) for F/U, BP check, Abryana Lykens PCP.   Total time spent:30 Minutes Time spent includes review of chart, medications, test results, and follow up plan with the patient.   [place] Controlled Substance Database was reviewed by me.  This patient was seen by Jonetta Osgood, FNP-C  in collaboration with Dr. Clayborn Bigness as a part of collaborative care agreement.   Kyi Romanello R. Valetta Fuller, MSN, FNP-C Internal medicine

## 2021-02-08 DIAGNOSIS — J301 Allergic rhinitis due to pollen: Secondary | ICD-10-CM | POA: Diagnosis not present

## 2021-02-08 DIAGNOSIS — J3089 Other allergic rhinitis: Secondary | ICD-10-CM | POA: Diagnosis not present

## 2021-02-08 DIAGNOSIS — J3081 Allergic rhinitis due to animal (cat) (dog) hair and dander: Secondary | ICD-10-CM | POA: Diagnosis not present

## 2021-02-15 DIAGNOSIS — J301 Allergic rhinitis due to pollen: Secondary | ICD-10-CM | POA: Diagnosis not present

## 2021-02-15 DIAGNOSIS — J3081 Allergic rhinitis due to animal (cat) (dog) hair and dander: Secondary | ICD-10-CM | POA: Diagnosis not present

## 2021-02-15 DIAGNOSIS — J3089 Other allergic rhinitis: Secondary | ICD-10-CM | POA: Diagnosis not present

## 2021-02-18 DIAGNOSIS — J301 Allergic rhinitis due to pollen: Secondary | ICD-10-CM | POA: Diagnosis not present

## 2021-02-18 DIAGNOSIS — J3089 Other allergic rhinitis: Secondary | ICD-10-CM | POA: Diagnosis not present

## 2021-02-18 DIAGNOSIS — J3081 Allergic rhinitis due to animal (cat) (dog) hair and dander: Secondary | ICD-10-CM | POA: Diagnosis not present

## 2021-03-03 DIAGNOSIS — J3089 Other allergic rhinitis: Secondary | ICD-10-CM | POA: Diagnosis not present

## 2021-03-03 DIAGNOSIS — J3081 Allergic rhinitis due to animal (cat) (dog) hair and dander: Secondary | ICD-10-CM | POA: Diagnosis not present

## 2021-03-03 DIAGNOSIS — J301 Allergic rhinitis due to pollen: Secondary | ICD-10-CM | POA: Diagnosis not present

## 2021-03-07 ENCOUNTER — Encounter: Payer: Self-pay | Admitting: Nurse Practitioner

## 2021-03-10 DIAGNOSIS — J3081 Allergic rhinitis due to animal (cat) (dog) hair and dander: Secondary | ICD-10-CM | POA: Diagnosis not present

## 2021-03-10 DIAGNOSIS — J301 Allergic rhinitis due to pollen: Secondary | ICD-10-CM | POA: Diagnosis not present

## 2021-03-10 DIAGNOSIS — J3089 Other allergic rhinitis: Secondary | ICD-10-CM | POA: Diagnosis not present

## 2021-03-14 ENCOUNTER — Ambulatory Visit (INDEPENDENT_AMBULATORY_CARE_PROVIDER_SITE_OTHER): Payer: PPO | Admitting: Nurse Practitioner

## 2021-03-14 ENCOUNTER — Encounter: Payer: Self-pay | Admitting: Nurse Practitioner

## 2021-03-14 ENCOUNTER — Other Ambulatory Visit: Payer: Self-pay

## 2021-03-14 VITALS — BP 130/78 | HR 90 | Temp 98.4°F | Resp 16 | Ht 60.0 in | Wt 163.2 lb

## 2021-03-14 DIAGNOSIS — I10 Essential (primary) hypertension: Secondary | ICD-10-CM | POA: Diagnosis not present

## 2021-03-14 DIAGNOSIS — R7303 Prediabetes: Secondary | ICD-10-CM

## 2021-03-14 DIAGNOSIS — E782 Mixed hyperlipidemia: Secondary | ICD-10-CM

## 2021-03-14 MED ORDER — LISINOPRIL 10 MG PO TABS: 10.0000 mg | ORAL_TABLET | Freq: Every day | ORAL | 1 refills | Status: DC

## 2021-03-14 NOTE — Progress Notes (Signed)
[facility] [place], [place] [address]  Internal MEDICINE  Office Visit Note  Patient Name: Stacie Ellis  [id number]  [id number]  Date of Service: 03/14/2021  Chief Complaint  Patient presents with   Follow-up   Hypertension   Gastroesophageal Reflux   Asthma    HPI Makenize presents for a follow up visit for hypertension. At her previous office visit, her telmisartan was stopped and she was started on lisinopril 10 mg daily. She also continues to be on amlodipine 2.5 mg, HCTZ 25 mg and carvedilol 25 mg. Her blood pressure is stable. She is continuing to work on diet modifications to control her cholesterol. She was prediabetic in the past but her last 2 A1C levels were 5.6 and she continues to manage her diet to control this.     Current Medication: Outpatient Encounter Medications as of 03/14/2021  Medication Sig   albuterol (PROVENTIL HFA;VENTOLIN HFA) 108 (90 Base) MCG/ACT inhaler Inhale 2 puffs into the lungs every 6 (six) hours as needed for wheezing or shortness of breath.   amLODipine (NORVASC) 2.5 MG tablet Take 1 tablet (2.5 mg total) by mouth 2 (two) times daily.   aspirin 81 MG tablet Take 81 mg by mouth daily.   budesonide-formoterol (SYMBICORT) 80-4.5 MCG/ACT inhaler Inhale 2 puffs into the lungs 2 (two) times daily.   carvedilol (COREG) 25 MG tablet TAKE 1 TABLET BY MOUTH TWICE DAILY WITH A MEAL   hydrochlorothiazide (HYDRODIURIL) 25 MG tablet TAKE 1 TABLET(25 MG) BY MOUTH EVERY MORNING   loratadine (CLARITIN) 10 MG tablet Take 10 mg by mouth daily.   montelukast (SINGULAIR) 10 MG tablet Take 1 tablet (10 mg total) by mouth at bedtime.   pantoprazole (PROTONIX) 40 MG tablet TAKE 1 TABLET(40 MG) BY MOUTH DAILY   [DISCONTINUED] lisinopril (ZESTRIL) 10 MG tablet Take 1 tablet (10 mg total) by mouth daily.   lisinopril (ZESTRIL) 10 MG tablet Take 1 tablet (10 mg total) by mouth daily.   No facility-administered encounter medications on file as  of 03/14/2021.    Surgical History: Past Surgical History:  Procedure Laterality Date   APPENDECTOMY  2008   BREAST BIOPSY Left 2011   pt states had stereo done on mobile unit at Dr. Dwyane Luo office. DCIS   BREAST LUMPECTOMY Left 02/2010   lumpectomy with re excision 03/2010 of left breast for cancer and rad tx   BREAST SURGERY Left 02/17/2010;03/10/2010   lumpectomy and repeat wide excision   CESAREAN SECTION     COLON SURGERY Right 06/13/2006   Hand-assisted right hemicolectomy. 5.8 cm villous adenoma without atypia   COLONOSCOPY  2015   Normal exam.   COLONOSCOPY WITH PROPOFOL N/A 05/29/2019   Procedure: COLONOSCOPY WITH PROPOFOL;  Surgeon: Lucilla Lame, MD;  Location[facility]COPY;  Service: Endoscopy;  Laterality: N/A;   ESOPHAGOGASTRODUODENOSCOPY (EGD) WITH PROPOFOL N/A 06/21/2015   Procedure: ESOPHAGOGASTRODUODENOSCOPY (EGD) WITH PROPOFOL;  Surgeon: Hulen Luster, MD;  Location: [facility] ENDOSCOPY;  Service: Gastroenterology;  Laterality: N/A;    Medical History: Past Medical History:  Diagnosis Date   Asthma    Breast cancer ([place]) 02/2010   GERD (gastroesophageal reflux disease) 2008   Hypertension    Malignant neoplasm of upper-outer quadrant of female breast ([place]) 2011   Intermediate grade DCIS, ER: 50%; PR 10%. left breast lumpectomy, wide excision and a repeat wide excision on 03/10/2010 for multiple positive margins on original resection   Malignant neoplasm of upper-outer quadrant of female breast [facility]) January  2012:    Re-excision to negative margins.    Obesity, unspecified    Postmenopausal bleeding 2013   S/P radiation therapy 2012   whole breast,    Special screening for malignant neoplasms, colon     Family History: Family History  Problem Relation Age of Onset   Cancer Father        colon   Cancer Other        unknown family member with breast cancer   Colon polyps Other        unknown family member with colon polyps    Social History   Socioeconomic  History   Marital status: Married    Spouse name: Not on file   Number of children: Not on file   Years of education: Not on file   Highest education level: Not on file  Occupational History   Not on file  Tobacco Use   Smoking status: Never   Smokeless tobacco: Never  Vaping Use   Vaping Use: Never used  Substance and Sexual Activity   Alcohol use: No   Drug use: No   Sexual activity: Yes    Birth control/protection: Post-menopausal  Other Topics Concern   Not on file  Social History Narrative   Not on file   Social Determinants of Health   Financial Resource Strain: Not on file  Food Insecurity: Not on file  Transportation Needs: Not on file  Physical Activity: Not on file  Stress: Not on file  Social Connections: Not on file  Intimate Partner Violence: Not on file      Review of Systems  Constitutional:  Negative for chills, fatigue and unexpected weight change.  HENT:  Negative for congestion, rhinorrhea, sneezing and sore throat.   Eyes:  Negative for redness.  Respiratory:  Negative for cough, chest tightness and shortness of breath.   Cardiovascular:  Negative for chest pain and palpitations.  Gastrointestinal:  Negative for abdominal pain, constipation, diarrhea, nausea and vomiting.  Genitourinary:  Negative for dysuria and frequency.  Musculoskeletal:  Negative for arthralgias, back pain, joint swelling and neck pain.  Skin:  Negative for rash.  Neurological: Negative.  Negative for tremors and numbness.  Hematological:  Negative for adenopathy. Does not bruise/bleed easily.  Psychiatric/Behavioral:  Negative for behavioral problems (Depression), sleep disturbance and suicidal ideas. The patient is not nervous/anxious.    Vital Signs: BP 130/78    Pulse 90    Temp 98.4 F (36.9 C)    Resp 16    Ht 5' (1.524 m)    Wt 163 lb 3.2 oz (74 kg)    SpO2 97%    BMI 31.87 kg/m    Physical Exam Vitals reviewed.  Constitutional:      General: She is not in  acute distress.    Appearance: Normal appearance. She is obese. She is not ill-appearing.  HENT:     Head: Normocephalic and atraumatic.  Eyes:     Pupils: Pupils are equal, round, and reactive to light.  Cardiovascular:     Rate and Rhythm: Normal rate and regular rhythm.  Pulmonary:     Effort: Pulmonary effort is normal. No respiratory distress.  Neurological:     Mental Status: She is alert and oriented to person, place, and time.     Cranial Nerves: No cranial nerve deficit.     Coordination: Coordination normal.     Gait: Gait normal.  Psychiatric:        Mood and Affect:  Mood normal.        Behavior: Behavior normal.       Assessment/Plan: 1. Essential hypertension Stable, refills ordered.  - lisinopril (ZESTRIL) 10 MG tablet; Take 1 tablet (10 mg total) by mouth daily.  Dispense: 90 tablet; Refill: 1  2. Mixed hyperlipidemia Continuing diet modifications to control cholesterol levels.   3. Pre-diabetes Well controlled with diet.    General Counseling: leba tibbitts understanding of the findings of todays visit and agrees with plan of treatment. I have discussed any further diagnostic evaluation that may be needed or ordered today. We also reviewed her medications today. she has been encouraged to call the office with any questions or concerns that should arise related to todays visit.    No orders of the defined types were placed in this encounter.   Meds ordered this encounter  Medications   lisinopril (ZESTRIL) 10 MG tablet    Sig: Take 1 tablet (10 mg total) by mouth daily.    Dispense:  90 tablet    Refill:  1    Return in about 6 months (around 09/11/2021) for F/U, med refill, BP check, Srihitha Tagliaferri PCP.   Total time spent:30 Minutes Time spent includes review of chart, medications, test results, and follow up plan with the patient.   [place] Controlled Substance Database was reviewed by me.  This patient was seen by Jonetta Osgood, FNP-C in  collaboration with Dr. Clayborn Bigness as a part of collaborative care agreement.   Edessa Jakubowicz R. Valetta Fuller, MSN, FNP-C Internal medicine

## 2021-03-15 DIAGNOSIS — J3081 Allergic rhinitis due to animal (cat) (dog) hair and dander: Secondary | ICD-10-CM | POA: Diagnosis not present

## 2021-03-15 DIAGNOSIS — J3089 Other allergic rhinitis: Secondary | ICD-10-CM | POA: Diagnosis not present

## 2021-03-15 DIAGNOSIS — J301 Allergic rhinitis due to pollen: Secondary | ICD-10-CM | POA: Diagnosis not present

## 2021-03-17 ENCOUNTER — Other Ambulatory Visit: Payer: Self-pay

## 2021-03-17 ENCOUNTER — Ambulatory Visit
Admission: RE | Admit: 2021-03-17 | Discharge: 2021-03-17 | Disposition: A | Discharge status: Home / Self Care | Payer: PPO | Source: Ambulatory Visit | Attending: Obstetrics and Gynecology | Admitting: Obstetrics and Gynecology

## 2021-03-17 ENCOUNTER — Encounter: Payer: Self-pay | Admitting: Obstetrics and Gynecology

## 2021-03-17 DIAGNOSIS — Z1231 Encounter for screening mammogram for malignant neoplasm of breast: Secondary | ICD-10-CM | POA: Insufficient documentation

## 2021-03-17 DIAGNOSIS — Z1239 Encounter for other screening for malignant neoplasm of breast: Secondary | ICD-10-CM

## 2021-03-17 DIAGNOSIS — M8589 Other specified disorders of bone density and structure, multiple sites: Secondary | ICD-10-CM | POA: Diagnosis not present

## 2021-03-17 DIAGNOSIS — Z923 Personal history of irradiation: Secondary | ICD-10-CM | POA: Diagnosis not present

## 2021-03-17 DIAGNOSIS — Z780 Asymptomatic menopausal state: Secondary | ICD-10-CM

## 2021-03-17 DIAGNOSIS — Z853 Personal history of malignant neoplasm of breast: Secondary | ICD-10-CM | POA: Diagnosis not present

## 2021-03-22 DIAGNOSIS — J3081 Allergic rhinitis due to animal (cat) (dog) hair and dander: Secondary | ICD-10-CM | POA: Diagnosis not present

## 2021-03-22 DIAGNOSIS — J3089 Other allergic rhinitis: Secondary | ICD-10-CM | POA: Diagnosis not present

## 2021-03-22 DIAGNOSIS — J301 Allergic rhinitis due to pollen: Secondary | ICD-10-CM | POA: Diagnosis not present

## 2021-04-05 DIAGNOSIS — J301 Allergic rhinitis due to pollen: Secondary | ICD-10-CM | POA: Diagnosis not present

## 2021-04-05 DIAGNOSIS — J3089 Other allergic rhinitis: Secondary | ICD-10-CM | POA: Diagnosis not present

## 2021-04-05 DIAGNOSIS — J3081 Allergic rhinitis due to animal (cat) (dog) hair and dander: Secondary | ICD-10-CM | POA: Diagnosis not present

## 2021-04-16 ENCOUNTER — Encounter: Payer: Self-pay | Admitting: Nurse Practitioner

## 2021-04-17 ENCOUNTER — Telehealth: Payer: Self-pay

## 2021-04-17 NOTE — Telephone Encounter (Signed)
Pt called on 04/16/21 and was c/o the lisinopril was causing her to have a bad cough.  Pt is out of town at the moment and thought she called on a Friday.  Pt advised that she was going to stop the lisinopril for now due to her taking two blood pressure meds already and will monitor her bp for now and see how it does.  Pt will call back on Monday

## 2021-04-18 ENCOUNTER — Telehealth: Payer: Self-pay

## 2021-04-19 DIAGNOSIS — J301 Allergic rhinitis due to pollen: Secondary | ICD-10-CM | POA: Diagnosis not present

## 2021-04-19 DIAGNOSIS — J3089 Other allergic rhinitis: Secondary | ICD-10-CM | POA: Diagnosis not present

## 2021-04-19 DIAGNOSIS — J3081 Allergic rhinitis due to animal (cat) (dog) hair and dander: Secondary | ICD-10-CM | POA: Diagnosis not present

## 2021-04-20 ENCOUNTER — Other Ambulatory Visit: Payer: Self-pay | Admitting: Nurse Practitioner

## 2021-04-20 ENCOUNTER — Telehealth: Payer: Self-pay

## 2021-04-20 MED ORDER — TELMISARTAN 20 MG PO TABS: 20.0000 mg | ORAL_TABLET | Freq: Every day | ORAL | 2 refills | Status: DC

## 2021-04-20 NOTE — Progress Notes (Signed)
Lisinopril is causing dry nagging cough, discontinued lisinopril. Patient reported that her BP did go up without the lisinopril so she needs and alternative.

## 2021-04-23 ENCOUNTER — Other Ambulatory Visit: Payer: Self-pay | Admitting: Internal Medicine

## 2021-04-26 ENCOUNTER — Telehealth: Payer: Self-pay

## 2021-04-26 DIAGNOSIS — J301 Allergic rhinitis due to pollen: Secondary | ICD-10-CM | POA: Diagnosis not present

## 2021-04-26 DIAGNOSIS — J3081 Allergic rhinitis due to animal (cat) (dog) hair and dander: Secondary | ICD-10-CM | POA: Diagnosis not present

## 2021-04-26 DIAGNOSIS — J3089 Other allergic rhinitis: Secondary | ICD-10-CM | POA: Diagnosis not present

## 2021-04-26 NOTE — Telephone Encounter (Signed)
LMOM to discuss med and future appt

## 2021-04-28 NOTE — Telephone Encounter (Signed)
LMOM, per Alyssa we need to know what pt's BP has been running. Alyssa may refer pt to cardiology for them to make any adjustments to her BP meds depending on how its been running.

## 2021-04-28 NOTE — Telephone Encounter (Signed)
LMOM to find out how her BP has been running

## 2021-05-02 NOTE — Telephone Encounter (Signed)
LMOM to find out how her BP is running

## 2021-05-03 DIAGNOSIS — J3081 Allergic rhinitis due to animal (cat) (dog) hair and dander: Secondary | ICD-10-CM | POA: Diagnosis not present

## 2021-05-03 DIAGNOSIS — J301 Allergic rhinitis due to pollen: Secondary | ICD-10-CM | POA: Diagnosis not present

## 2021-05-03 DIAGNOSIS — J3089 Other allergic rhinitis: Secondary | ICD-10-CM | POA: Diagnosis not present

## 2021-05-04 ENCOUNTER — Telehealth: Payer: Self-pay

## 2021-05-04 NOTE — Telephone Encounter (Signed)
Sent mychart message requesting pt call to discuss how her BP is running  ?

## 2021-05-05 NOTE — Telephone Encounter (Signed)
Spoke to pt, she said without the talmisartan or losartan her BP was high. The cough went away when she stopped the losartan but the hoarseness never went away. She has started taking talmisartan and that has kept her BP within range but she is still horse. She does not want to see a cardiologist.   ?

## 2021-05-10 DIAGNOSIS — J3089 Other allergic rhinitis: Secondary | ICD-10-CM | POA: Diagnosis not present

## 2021-05-10 DIAGNOSIS — J3081 Allergic rhinitis due to animal (cat) (dog) hair and dander: Secondary | ICD-10-CM | POA: Diagnosis not present

## 2021-05-10 DIAGNOSIS — J301 Allergic rhinitis due to pollen: Secondary | ICD-10-CM | POA: Diagnosis not present

## 2021-05-17 DIAGNOSIS — J301 Allergic rhinitis due to pollen: Secondary | ICD-10-CM | POA: Diagnosis not present

## 2021-05-17 DIAGNOSIS — J3081 Allergic rhinitis due to animal (cat) (dog) hair and dander: Secondary | ICD-10-CM | POA: Diagnosis not present

## 2021-05-17 DIAGNOSIS — J3089 Other allergic rhinitis: Secondary | ICD-10-CM | POA: Diagnosis not present

## 2021-05-24 DIAGNOSIS — J3081 Allergic rhinitis due to animal (cat) (dog) hair and dander: Secondary | ICD-10-CM | POA: Diagnosis not present

## 2021-05-24 DIAGNOSIS — J301 Allergic rhinitis due to pollen: Secondary | ICD-10-CM | POA: Diagnosis not present

## 2021-05-24 DIAGNOSIS — J3089 Other allergic rhinitis: Secondary | ICD-10-CM | POA: Diagnosis not present

## 2021-05-28 ENCOUNTER — Other Ambulatory Visit: Payer: Self-pay | Admitting: Internal Medicine

## 2021-05-28 DIAGNOSIS — D0512 Intraductal carcinoma in situ of left breast: Secondary | ICD-10-CM

## 2021-05-31 DIAGNOSIS — J3089 Other allergic rhinitis: Secondary | ICD-10-CM | POA: Diagnosis not present

## 2021-05-31 DIAGNOSIS — J301 Allergic rhinitis due to pollen: Secondary | ICD-10-CM | POA: Diagnosis not present

## 2021-05-31 DIAGNOSIS — J3081 Allergic rhinitis due to animal (cat) (dog) hair and dander: Secondary | ICD-10-CM | POA: Diagnosis not present

## 2021-06-14 DIAGNOSIS — J3081 Allergic rhinitis due to animal (cat) (dog) hair and dander: Secondary | ICD-10-CM | POA: Diagnosis not present

## 2021-06-14 DIAGNOSIS — J3089 Other allergic rhinitis: Secondary | ICD-10-CM | POA: Diagnosis not present

## 2021-06-14 DIAGNOSIS — J301 Allergic rhinitis due to pollen: Secondary | ICD-10-CM | POA: Diagnosis not present

## 2021-06-21 DIAGNOSIS — J3081 Allergic rhinitis due to animal (cat) (dog) hair and dander: Secondary | ICD-10-CM | POA: Diagnosis not present

## 2021-06-21 DIAGNOSIS — J3089 Other allergic rhinitis: Secondary | ICD-10-CM | POA: Diagnosis not present

## 2021-06-21 DIAGNOSIS — J301 Allergic rhinitis due to pollen: Secondary | ICD-10-CM | POA: Diagnosis not present

## 2021-06-28 DIAGNOSIS — J3089 Other allergic rhinitis: Secondary | ICD-10-CM | POA: Diagnosis not present

## 2021-06-28 DIAGNOSIS — J301 Allergic rhinitis due to pollen: Secondary | ICD-10-CM | POA: Diagnosis not present

## 2021-06-28 DIAGNOSIS — J3081 Allergic rhinitis due to animal (cat) (dog) hair and dander: Secondary | ICD-10-CM | POA: Diagnosis not present

## 2021-07-07 DIAGNOSIS — J3089 Other allergic rhinitis: Secondary | ICD-10-CM | POA: Diagnosis not present

## 2021-07-07 DIAGNOSIS — J3081 Allergic rhinitis due to animal (cat) (dog) hair and dander: Secondary | ICD-10-CM | POA: Diagnosis not present

## 2021-07-07 DIAGNOSIS — J4530 Mild persistent asthma, uncomplicated: Secondary | ICD-10-CM | POA: Diagnosis not present

## 2021-07-07 DIAGNOSIS — J301 Allergic rhinitis due to pollen: Secondary | ICD-10-CM | POA: Diagnosis not present

## 2021-07-14 DIAGNOSIS — J3081 Allergic rhinitis due to animal (cat) (dog) hair and dander: Secondary | ICD-10-CM | POA: Diagnosis not present

## 2021-07-14 DIAGNOSIS — J3089 Other allergic rhinitis: Secondary | ICD-10-CM | POA: Diagnosis not present

## 2021-07-14 DIAGNOSIS — J301 Allergic rhinitis due to pollen: Secondary | ICD-10-CM | POA: Diagnosis not present

## 2021-07-19 DIAGNOSIS — J3081 Allergic rhinitis due to animal (cat) (dog) hair and dander: Secondary | ICD-10-CM | POA: Diagnosis not present

## 2021-07-19 DIAGNOSIS — J301 Allergic rhinitis due to pollen: Secondary | ICD-10-CM | POA: Diagnosis not present

## 2021-07-19 DIAGNOSIS — J3089 Other allergic rhinitis: Secondary | ICD-10-CM | POA: Diagnosis not present

## 2021-07-28 DIAGNOSIS — J3089 Other allergic rhinitis: Secondary | ICD-10-CM | POA: Diagnosis not present

## 2021-07-28 DIAGNOSIS — J301 Allergic rhinitis due to pollen: Secondary | ICD-10-CM | POA: Diagnosis not present

## 2021-07-28 DIAGNOSIS — J3081 Allergic rhinitis due to animal (cat) (dog) hair and dander: Secondary | ICD-10-CM | POA: Diagnosis not present

## 2021-08-01 ENCOUNTER — Other Ambulatory Visit: Payer: Self-pay | Admitting: Internal Medicine

## 2021-08-01 DIAGNOSIS — D0512 Intraductal carcinoma in situ of left breast: Secondary | ICD-10-CM

## 2021-08-02 ENCOUNTER — Ambulatory Visit (INDEPENDENT_AMBULATORY_CARE_PROVIDER_SITE_OTHER): Payer: PPO | Admitting: Family Medicine

## 2021-08-02 ENCOUNTER — Encounter: Payer: Self-pay | Admitting: Family Medicine

## 2021-08-02 VITALS — BP 124/68 | HR 84 | Ht 60.0 in | Wt 153.8 lb

## 2021-08-02 DIAGNOSIS — E782 Mixed hyperlipidemia: Secondary | ICD-10-CM

## 2021-08-02 DIAGNOSIS — Z636 Dependent relative needing care at home: Secondary | ICD-10-CM | POA: Diagnosis not present

## 2021-08-02 DIAGNOSIS — Z7689 Persons encountering health services in other specified circumstances: Secondary | ICD-10-CM

## 2021-08-02 DIAGNOSIS — I10 Essential (primary) hypertension: Secondary | ICD-10-CM

## 2021-08-02 DIAGNOSIS — Z853 Personal history of malignant neoplasm of breast: Secondary | ICD-10-CM | POA: Diagnosis not present

## 2021-08-02 DIAGNOSIS — R7303 Prediabetes: Secondary | ICD-10-CM

## 2021-08-02 NOTE — Progress Notes (Unsigned)
Subjective:    Patient ID: Stacie Ellis, female    DOB: 10/16/1955, 66 y.o.   MRN: [id number]  Stacie Ellis is a 66 y.o. female presenting on 08/02/2021 for Establish Care  Establish care with new PCP. Previously at [facility].  HPI  Hoarseness of Voice >3-4 years approx She has been to ENT previously has had previous visualization and also has seen GI for endoscopy She has been treated for GERD / LPR and on PPI continuous for now without resolution Non smoker. Continues w/ allergies  She has seen Dr Ronnald Collum (Endocrinology) following her yearly for thyroid and he suggested possibly consider ARB medication.  ***ARB question about vocal cord. ***Angioedema???  She saw her previous PCP and they made a change by stopping her Telmisartan 39m due to concern of vocal cord issue, and they doubled the Amlodipine 2.56mBID dosing and she experienced significant swelling in lower ext ankle.  She was on Metoprolol for years. Previously, now has been doing Carvedilol. History of elevated BP will get heart palpitations and tension and pressure in face. She has been doing well now on current meds She has been on BP medication since age [age]she has significant fam history of father and older brother with HTN diagnosed in their 2032s Currently taking - Amlodipine 2.62m76mID (that is how it was ordered) - Carvedilol 262m9mD - HCTZ 262mg17mly - Telmisartan 20mg 36my  History 2008, she had weakness, fatigue. She history of Anemia with Hgb 7 at that time She had Colonoscopy and completed polyp excision by Dr ByrnetBary Castillae section of colon 11 inches removed. Father had colon CA.  ***2ndary HTN causes  Breast Cancer 2012 Dr ByrnetBary Castillaal lumpectomy without clear margins, required repeat excision.  Health Maintenance: ***     02/07/2021    1:58 PM 12/08/2020   10:19 AM 06/30/2020   11:02 AM  Depression screen PHQ 2/9  Decreased Interest 0 0 0  Down, Depressed,  Hopeless 0 0 0  PHQ - 2 Score 0 0 0    Past Medical History:  Diagnosis Date   Asthma    Breast cancer (H[facility]) [id number]   GERD (gastroesophageal reflux disease) 2008   Hypertension    Malignant neoplasm of upper-outer quadrant of female breast (H[facility]) 2[place]   Intermediate grade DCIS, ER: 50%; PR 10%. left breast lumpectomy, wide excision and a repeat wide excision on 03/10/2010 for multiple positive margins on original resection   [facility]:    Re-excision to negative margins.    Obesity, unspecified    Postmenopausal bleeding 2013   S/P radiation therapy 2012   whole breast,    Special screening for malignant neoplasms, colon    Past Surgical History:  Procedure Laterality Date   APPENDECTOMY  2008   BREAST BIOPSY Left 2011   pt states had stereo done on mobile unit at Dr. ByrnetDwyane Luoe. DCIS   BREAST LUMPECTOMY Left 02/2010   lumpectomy with re excision 03/2010 of left breast for cancer and rad tx   BREAST SURGERY Left 02/17/2010;03/10/2010   lumpectomy and repeat wide excision   CESAREAN SECTION     COLON SURGERY Right 06/13/2006   Hand-assisted right hemicolectomy. 5.8 cm villous adenoma without atypia   COLONOSCOPY  2015   Normal exam.   COLONOSCOPY WITH PROPOFOL N/A 05/29/2019   Procedure: COLONOSCOPY WITH PROPOFOL;  Surgeon: Wohl, Lucilla Lame [facility];  Service: Endoscopy;  Laterality: N/A;   ESOPHAGOGASTRODUODENOSCOPY (EGD) WITH PROPOFOL N/A 06/21/2015   Procedure: ESOPHAGOGASTRODUODENOSCOPY (EGD) WITH PROPOFOL;  Surgeon: Hulen Luster, MD;  Location: [facility] ENDOSCOPY;  Service: Gastroenterology;  Laterality: N/A;   Social History   Socioeconomic History   Marital status: Married    Spouse name: Not on file   Number of children: Not on file   Years of education: Not on file   Highest education level: Not on file  Occupational History   Not on file  Tobacco Use   Smoking status: Never    Smokeless tobacco: Never  Vaping Use   Vaping Use: Never used  Substance and Sexual Activity   Alcohol use: No   Drug use: No   Sexual activity: Yes    Birth control/protection: Post-menopausal  Other Topics Concern   Not on file  Social History Narrative   Not on file   Social Determinants of Health   Financial Resource Strain: Not on file  Food Insecurity: Not on file  Transportation Needs: Not on file  Physical Activity: Not on file  Stress: Not on file  Social Connections: Not on file  Intimate Partner Violence: Not on file   Family History  Problem Relation Age of Onset   Cancer Father        colon   Cancer Other        unknown family member with breast cancer   Colon polyps Other        unknown family member with colon polyps   Breast cancer Neg Hx    Current Outpatient Medications on File Prior to Visit  Medication Sig   albuterol (PROVENTIL HFA;VENTOLIN HFA) 108 (90 Base) MCG/ACT inhaler Inhale 2 puffs into the lungs every 6 (six) hours as needed for wheezing or shortness of breath.   amLODipine (NORVASC) 2.5 MG tablet Take 1 tablet (2.5 mg total) by mouth 2 (two) times daily.   aspirin 81 MG tablet Take 81 mg by mouth daily.   budesonide-formoterol (SYMBICORT) 80-4.5 MCG/ACT inhaler Inhale 2 puffs into the lungs 2 (two) times daily.   carvedilol (COREG) 25 MG tablet TAKE 1 TABLET BY MOUTH TWICE DAILY WITH MEAL   hydrochlorothiazide (HYDRODIURIL) 25 MG tablet TAKE 1 TABLET BY MOUTH EVERY MORNING   loratadine (CLARITIN) 10 MG tablet Take 10 mg by mouth daily.   montelukast (SINGULAIR) 10 MG tablet TAKE 1 TABLET(10 MG) BY MOUTH AT BEDTIME   pantoprazole (PROTONIX) 40 MG tablet TAKE 1 TABLET BY MOUTH DAILY   telmisartan (MICARDIS) 20 MG tablet Take 1 tablet (20 mg total) by mouth daily.   No current facility-administered medications on file prior to visit.    Review of Systems Per HPI unless specifically indicated above      Objective:    BP 124/68    Pulse 84   Ht 5' (1.524 m)   Wt 153 lb 12.8 oz (69.8 kg)   SpO2 98%   BMI 30.04 kg/m   Wt Readings from Last 3 Encounters:  08/02/21 153 lb 12.8 oz (69.8 kg)  03/14/21 163 lb 3.2 oz (74 kg)  02/07/21 163 lb 6.4 oz (74.1 kg)    Physical Exam   Results for orders placed or performed in visit on 12/08/20  Microscopic Examination   Urine  Result Value Ref Range   WBC, UA None seen 0 - 5 /hpf   RBC None seen 0 - 2 /hpf   Epithelial Cells (non renal) 0-10 0 - 10 /hpf   Casts  None seen None seen /lpf   Bacteria, UA None seen None seen/Few  CBC with Differential/Platelet  Result Value Ref Range   WBC 5.3 3.4 - 10.8 x10E3/uL   RBC 4.78 3.77 - 5.28 x10E6/uL   Hemoglobin 13.4 11.1 - 15.9 g/dL   Hematocrit 40.0 34.0 - 46.6 %   MCV 84 79 - 97 fL   MCH 28.0 26.6 - 33.0 pg   MCHC 33.5 31.5 - 35.7 g/dL   RDW 13.8 11.7 - 15.4 %   Platelets 219 150 - 450 x10E3/uL   Neutrophils 49 Not Estab. %   Lymphs 37 Not Estab. %   Monocytes 10 Not Estab. %   Eos 3 Not Estab. %   Basos 1 Not Estab. %   Neutrophils Absolute 2.6 1.4 - 7.0 x10E3/uL   Lymphocytes Absolute 1.9 0.7 - 3.1 x10E3/uL   Monocytes Absolute 0.5 0.1 - 0.9 x10E3/uL   EOS (ABSOLUTE) 0.2 0.0 - 0.4 x10E3/uL   Basophils Absolute 0.0 0.0 - 0.2 x10E3/uL   Immature Granulocytes 0 Not Estab. %   Immature Grans (Abs) 0.0 0.0 - 0.1 x10E3/uL  CMP14+EGFR  Result Value Ref Range   Glucose 90 70 - 99 mg/dL   BUN 13 8 - 27 mg/dL   Creatinine, Ser 0.66 0.57 - 1.00 mg/dL   eGFR 97 >59 mL/min/1.73   BUN/Creatinine Ratio 20 12 - 28   Sodium 141 134 - 144 mmol/L   Potassium 4.1 3.5 - 5.2 mmol/L   Chloride 99 96 - 106 mmol/L   CO2 27 20 - 29 mmol/L   Calcium 9.6 8.7 - 10.3 mg/dL   Total Protein 7.1 6.0 - 8.5 g/dL   Albumin 4.4 3.8 - 4.8 g/dL   Globulin, Total 2.7 1.5 - 4.5 g/dL   Albumin/Globulin Ratio 1.6 1.2 - 2.2   Bilirubin Total 0.6 0.0 - 1.2 mg/dL   Alkaline Phosphatase 94 44 - 121 IU/L   AST 21 0 - 40 IU/L   ALT 14 0 - 32 IU/L   Lipid Profile  Result Value Ref Range   Cholesterol, Total 229 (H) 100 - 199 mg/dL   Triglycerides 126 0 - 149 mg/dL   HDL 62 >39 mg/dL   VLDL Cholesterol Cal 22 5 - 40 mg/dL   LDL Chol Calc (NIH) 145 (H) 0 - 99 mg/dL   Chol/HDL Ratio 3.7 0.0 - 4.4 ratio  TSH + free T4  Result Value Ref Range   TSH 1.590 0.450 - 4.500 uIU/mL   Free T4 1.37 0.82 - 1.77 ng/dL  Vitamin D (25 hydroxy)  Result Value Ref Range   Vit D, 25-Hydroxy 94.7 30.0 - 100.0 ng/mL  Magnesium, RBC  Result Value Ref Range   Magnesium RBC 5.7 4.2 - 6.8 mg/dL  Iron, TIBC and Ferritin Panel  Result Value Ref Range   Total Iron Binding Capacity 455 (H) 250 - 450 ug/dL   UIBC 368 118 - 369 ug/dL   Iron 87 27 - 139 ug/dL   Iron Saturation 19 15 - 55 %   Ferritin 19 15 - 150 ng/mL  C-reactive protein  Result Value Ref Range   CRP 9 0 - 10 mg/L  Sed Rate (ESR)  Result Value Ref Range   Sed Rate 36 0 - 40 mm/hr  B12 and Folate Panel  Result Value Ref Range   Vitamin B-12 751 232 - 1,245 pg/mL   Folate 12.3 >3.0 ng/mL  UA/M w/rflx Culture, Routine   Specimen: Urine  Urine  Result Value Ref Range   Specific Gravity, UA 1.020 1.005 - 1.030   pH, UA 7.0 5.0 - 7.5   Color, UA Yellow Yellow   Appearance Ur Cloudy (A) Clear   Leukocytes,UA Negative Negative   Protein,UA Negative Negative/Trace   Glucose, UA Negative Negative   Ketones, UA Negative Negative   RBC, UA Negative Negative   Bilirubin, UA Negative Negative   Urobilinogen, Ur 0.2 0.2 - 1.0 mg/dL   Nitrite, UA Negative Negative   Microscopic Examination Comment    Microscopic Examination See below:    Urinalysis Reflex Comment       Assessment & Plan:   Problem List Items Addressed This Visit   None   No orders of the defined types were placed in this encounter.     Follow up plan: No follow-ups on file.  ***[place] patient, would need to consider respite care options for her and Murray Hodgkins. Send message to [facility] about list for respite  care  ***CMET Lipid A1c CBC  Rest of labs thyroid from Endocrine Dr Ronnald Collum  ***Referral to Dr Oval Linsey 2ndary BP???? Age, PCKD?   Nobie Putnam, [facility] 08/02/2021, 3:31 PM

## 2021-08-02 NOTE — Patient Instructions (Addendum)
Thank you for coming to the office today.  Dr Skeet Latch - Advanced Hypertension  [facility] [address] [place], [place] [address] [phone]  -------------------------------  Continue current meds for next 3 months, message on mychart when ready for refills.  DUE for FASTING BLOOD WORK (no food or drink after midnight before the lab appointment, only water or coffee without cream/sugar on the morning of)  SCHEDULE "Lab Only" visit in the morning at the clinic for lab draw in 4 MONTHS   - Make sure Lab Only appointment is at about 1 week before your next appointment, so that results will be available  For Lab Results, once available within 2-3 days of blood draw, you can can log in to MyChart online to view your results and a brief explanation. Also, we can discuss results at next follow-up visit.   Please schedule a Follow-up Appointment to: Return in about 4 months (around 12/03/2021) for 4 month fasting lab only then 1 week later Annual Physical.  If you have any other questions or concerns, please feel free to call the office or send a message through [place]. You may also schedule an earlier appointment if necessary.  Additionally, you may be receiving a survey about your experience at our office within a few days to 1 week by e-mail or mail. We value your feedback.  Nobie Putnam, DO [place]

## 2021-08-03 ENCOUNTER — Encounter: Payer: Self-pay | Admitting: Family Medicine

## 2021-08-03 ENCOUNTER — Other Ambulatory Visit: Payer: Self-pay | Admitting: Family Medicine

## 2021-08-03 DIAGNOSIS — R7303 Prediabetes: Secondary | ICD-10-CM

## 2021-08-03 DIAGNOSIS — Z636 Dependent relative needing care at home: Secondary | ICD-10-CM | POA: Insufficient documentation

## 2021-08-03 DIAGNOSIS — I10 Essential (primary) hypertension: Secondary | ICD-10-CM

## 2021-08-03 DIAGNOSIS — Z0000 Encounter for general adult medical examination without abnormal findings: Secondary | ICD-10-CM

## 2021-08-03 DIAGNOSIS — E782 Mixed hyperlipidemia: Secondary | ICD-10-CM

## 2021-08-04 ENCOUNTER — Telehealth: Payer: Self-pay | Admitting: *Deleted

## 2021-08-04 ENCOUNTER — Other Ambulatory Visit: Payer: Self-pay | Admitting: Family Medicine

## 2021-08-04 ENCOUNTER — Encounter: Payer: Self-pay | Admitting: Family Medicine

## 2021-08-04 ENCOUNTER — Ambulatory Visit (INDEPENDENT_AMBULATORY_CARE_PROVIDER_SITE_OTHER): Payer: PPO

## 2021-08-04 DIAGNOSIS — Z636 Dependent relative needing care at home: Secondary | ICD-10-CM

## 2021-08-04 DIAGNOSIS — J3081 Allergic rhinitis due to animal (cat) (dog) hair and dander: Secondary | ICD-10-CM | POA: Diagnosis not present

## 2021-08-04 DIAGNOSIS — J3089 Other allergic rhinitis: Secondary | ICD-10-CM | POA: Diagnosis not present

## 2021-08-04 DIAGNOSIS — E782 Mixed hyperlipidemia: Secondary | ICD-10-CM

## 2021-08-04 DIAGNOSIS — J301 Allergic rhinitis due to pollen: Secondary | ICD-10-CM | POA: Diagnosis not present

## 2021-08-04 DIAGNOSIS — I10 Essential (primary) hypertension: Secondary | ICD-10-CM

## 2021-08-04 MED ORDER — MAGNESIUM GLYCINATE 100 MG PO CAPS: 200.0000 mg | ORAL_CAPSULE | Freq: Every day | ORAL | Status: AC

## 2021-08-04 MED ORDER — VITAMIN D3 50 MCG (2000 UT) PO CAPS: 4000.0000 [IU] | ORAL_CAPSULE | Freq: Every day | ORAL | Status: AC

## 2021-08-04 NOTE — Telephone Encounter (Signed)
   Telephone encounter was:  Successful.  08/04/2021 Name: Stacie Ellis MRN: [id number] DOB: [birth year]  Stacie Ellis is a 66 y.o. year old female who is a primary care patient of Olin Hauser, DO . The community resource team was consulted for assistance with Caregiver Stress  Care guide performed the following interventions: Patient provided with information about care guide support team and interviewed to confirm resource needs. Will mail information [facility] , [facility] , [facility] as well as information on caregiver respite Follow Up Plan:   Will follow up in a week or so  [place], Care Management  [phone] [address] , [place] [address] Email : [email or web address]

## 2021-08-04 NOTE — Patient Instructions (Signed)
Visit Information   Thank you for taking time to visit with me today. Please don't hesitate to contact me if I can be of assistance to you before our next scheduled telephone appointment.  Following are the goals we discussed today:  (Copy and paste patient goals from clinical care plan here)  Our next appointment is by telephone on 09-08-2021 at 1 pm   Please call the care guide team at [phone] if you need to cancel or reschedule your appointment.   If you are experiencing a Mental Health or [place] or need someone to talk to, please call the Suicide and Crisis Lifeline: 988 call the [place] National Suicide Prevention Lifeline: 239-447-7300 or TTY: (816)341-0350 TTY 248-096-0117) to talk to a trained counselor call 1-800-273-TALK (toll free, 24 hour hotline)   Following is a copy of your full care plan:  Care Plan : RNCM: General Plan of Care (Adult) for Chronic Disease Management and Care Coordination Needs  Updates made by Vanita Ingles, RN since 08/04/2021 12:00 AM     Problem: RNCM: Development of plan of care for Chronic Disease Management (HTN, HLD, Caregiver stress)   Priority: High  Onset Date: 08/04/2021     Long-Range Goal: RNCM: Effective Management  of plan of care for Chronic Disease Management (HTN, HLD, Caregiver stress)   Start Date: 08/04/2021  Expected End Date: 08/05/2022  This Visit's Progress: On track  Priority: High  Note:   Current Barriers:  Care Coordination needs related to respite care services for intellectually challenged sister (patients mother and patient are caregivers to patients sister)  Chronic Disease Management support and education needs related to HTN and HLD Resources needs for respite services in [place]):  Patient will verbalize understanding of plan for management of HTN, HLD, and Caregiver stress as evidenced by routine follow up with the pcp and specialist as needed, following plan of care,  following dietary restrictions, and working with the CCM team to effectively manage health and well being.  take all medications exactly as prescribed and will call provider for medication related questions as evidenced by taking medications as ordered and not missing doses, calling for refills before running out of medications.    attend all scheduled medical appointments: with pcp and specialist  as evidenced by keeping appointments and calling for schedule change needs        demonstrate improved and ongoing adherence to prescribed treatment plan for HTN, HLD, and care give stress as evidenced by stable VS, no exacerbations of chronic conditions, and having resources available for respite care and services as needed for patients sister work with Education officer, museum to address caregiver stress and respite services related to the management of HTN, HLD, and care giver stress as evidenced by review of EMR and patient or social worker report     demonstrate ongoing self health care management ability for effective management of chronic conditions as evidenced by  working with the CCM team through collaboration with Consulting civil engineer, provider, and care team.   Interventions: 1:1 collaboration with primary care provider regarding development and update of comprehensive plan of care as evidenced by provider attestation and co-signature Inter-disciplinary care team collaboration (see longitudinal plan of care) Evaluation of current treatment plan related to  self management and patient's adherence to plan as established by provider   Caregiver stress/resources for respite care  (Status: New goal. Goal on Track (progressing): YES.) Long Term Goal  Evaluation of current treatment  plan related to  caregiver stress/resources for respite care ,  caregiver stress related to the care of 29 year old sister with intellectual disabilities  self-management and patient's adherence to plan as established by  provider. Discussed plans with patient for ongoing care management follow up and provided patient with direct contact information for care management team Advised patient to call the office for changes in mood, anxiety, questions, or concerns related to care giver stress or needed resources; Provided education to patient re: [facility] services and resources in [place] for respite services. The patients 19 year old mother cares for her sister Manuela Schwartz who is 6 and intellectually challenged. The patient assist with care also. Wants to know about resources to help with respite services available; Collaborated with pcp and LCSW regarding respite services and caregiver stress. The pcp is active in the care of the patient and patients sister. The patient agrees to talk with the LCSW as well; Provided patient with [facility] booklet by email and phone numbers with website educational materials related to resources in [facility] for respite care and services. Also a careguide referral placed for any additional resources that may be available for respite services.; Reviewed scheduled/upcoming provider appointments including saw pcp on 08-02-2021 and knows to call for changes. The patient will have AWV in July; Social Work referral for assistance with caregiver stress and support for the patient. Has upcoming appointment on 08-09-2021 at 115 pm with LCSW; Discussed plans with patient for ongoing care management follow up and provided patient with direct contact information for care management team; Advised patient to discuss new concerns or questions with provider. Also provided the patient with the RNCM direct number to call for assistance, questions, or concerns; Screening for signs and symptoms of depression related to chronic disease state;  Assessed social determinant of health barriers;   Hyperlipidemia:  (Status: New goal.) Long Term Goal  Lab Results  Component Value Date   CHOL 229 (H)  12/08/2020   HDL 62 12/08/2020   LDLCALC 145 (H) 12/08/2020   TRIG 126 12/08/2020   CHOLHDL 3.7 12/08/2020     Medication review performed; medication list updated in electronic medical record.  Provider established cholesterol goals reviewed; Counseled on importance of regular laboratory monitoring as prescribed; Provided HLD educational materials; Reviewed importance of limiting foods high in cholesterol;  Hypertension: (Status: New goal. Goal on Track (progressing): YES.) Long Term Goal  Last practice recorded BP readings:  BP Readings from Last 3 Encounters:  08/02/21 124/68  03/14/21 130/78  02/07/21 (!) 156/78  Most recent eGFR/CrCl:  Lab Results  Component Value Date   EGFR 97 12/08/2020    No components found for: CRCL Self reported blood pressure from 08-03-2021: 124/68 Evaluation of current treatment plan related to hypertension self management and patient's adherence to plan as established by provider. 08-04-2021: The patient established care with pcp this week. The patient has been dealing with HTN since her early 20's. She takes her blood pressures on a consistent basis and records. She is receptive to CCM calls and assistance with chronic conditions;   Provided education to patient re: stroke prevention, s/s of heart attack and stroke; Reviewed prescribed diet heart healthy diet- sent healthy eating and low sodium brochure by email to the patient Reviewed medications with patient and discussed importance of compliance;  Discussed plans with patient for ongoing care management follow up and provided patient with direct contact information for care management team; Advised patient, providing education and rationale, to  monitor blood pressure daily and record, calling PCP for findings outside established parameters;  Advised patient to discuss changes in HTN and heart healthy with provider; Provided education on prescribed diet Heart healthy diet, will also send Emmi  information and mychart information on HTN health and well being;  Discussed complications of poorly controlled blood pressure such as heart disease, stroke, circulatory complications, vision complications, kidney impairment, sexual dysfunction;   Patient Goals/Self-Care Activities: Take medications as prescribed   Attend all scheduled provider appointments Call pharmacy for medication refills 3-7 days in advance of running out of medications Attend church or other social activities Perform all self care activities independently  Perform IADL's (shopping, preparing meals, housekeeping, managing finances) independently Call provider office for new concerns or questions  Work with the social worker to address care coordination needs and will continue to work with the clinical team to address health care and disease management related needs call the Suicide and Crisis Lifeline: 988 call the [place] National Suicide Prevention Lifeline: 647-527-9232 or TTY: (318)101-4897 TTY 248-780-4720) to talk to a trained counselor call 1-800-273-TALK (toll free, 24 hour hotline) if experiencing a Mental Health or [place]  check blood pressure 3 times per week choose a place to take my blood pressure (home, clinic or office, retail store) write blood pressure results in a log or diary learn about high blood pressure keep a blood pressure log take blood pressure log to all doctor appointments call doctor for signs and symptoms of high blood pressure develop an action plan for high blood pressure keep all doctor appointments take medications for blood pressure exactly as prescribed report new symptoms to your doctor eat more whole grains, fruits and vegetables, lean meats and healthy fats limit salt intake to 2 grams/day - call for medicine refill 2 or 3 days before it runs out - take all medications exactly as prescribed - call doctor with any symptoms you believe are related to your  medicine - call doctor when you experience any new symptoms - go to all doctor appointments as scheduled - adhere to prescribed diet: heart healthy diet - develop an exercise routine       Consent to CCM Services: Ms. Gresham was given information about Chronic Care Management services including:  CCM service includes personalized support from designated clinical staff supervised by her physician, including individualized plan of care and coordination with other care providers 24/7 contact phone numbers for assistance for urgent and routine care needs. Service will only be billed when office clinical staff spend 20 minutes or more in a month to coordinate care. Only one practitioner may furnish and bill the service in a calendar month. The patient may stop CCM services at any time (effective at the end of the month) by phone call to the office staff. The patient will be responsible for cost sharing (co-pay) of up to 20% of the service fee (after annual deductible is met).  Patient agreed to services and verbal consent obtained.   Patient verbalizes understanding of instructions and care plan provided today and agrees to view in [facility]. Active MyChart status and patient understanding of how to access instructions and care plan via MyChart confirmed with patient.     Telephone follow up appointment with care management team member scheduled for: 09-08-2021 at 1 pm  Noreene Larsson RN, MSN, [facility] Mobile: [phone]   DASH Eating Plan DASH stands for Dietary Approaches  to Stop Hypertension. The DASH eating plan is a healthy eating plan that has been shown to: Reduce high blood pressure (hypertension). Reduce your risk for type 2 diabetes, heart disease, and stroke. Help with weight loss. What are tips for following this plan? Reading food labels Check food labels for the amount of salt (sodium) per  serving. Choose foods with less than 5 percent of the Daily Value of sodium. Generally, foods with less than 300 milligrams (mg) of sodium per serving fit into this eating plan. To find whole grains, look for the word "whole" as the first word in the ingredient list. Shopping Buy products labeled as "low-sodium" or "no salt added." Buy fresh foods. Avoid canned foods and pre-made or frozen meals. Cooking Avoid adding salt when cooking. Use salt-free seasonings or herbs instead of table salt or sea salt. Check with your health care provider or pharmacist before using salt substitutes. Do not fry foods. Cook foods using healthy methods such as baking, boiling, grilling, roasting, and broiling instead. Cook with heart-healthy oils, such as olive, canola, avocado, soybean, or sunflower oil. Meal planning  Eat a balanced diet that includes: 4 or more servings of fruits and 4 or more servings of vegetables each day. Try to fill one-half of your plate with fruits and vegetables. 6-8 servings of whole grains each day. Less than 6 oz (170 g) of lean meat, poultry, or fish each day. A 3-oz (85-g) serving of meat is about the same size as a deck of cards. One egg equals 1 oz (28 g). 2-3 servings of low-fat dairy each day. One serving is 1 cup (237 mL). 1 serving of nuts, seeds, or beans 5 times each week. 2-3 servings of heart-healthy fats. Healthy fats called omega-3 fatty acids are found in foods such as walnuts, flaxseeds, fortified milks, and eggs. These fats are also found in cold-water fish, such as sardines, salmon, and mackerel. Limit how much you eat of: Canned or prepackaged foods. Food that is high in trans fat, such as some fried foods. Food that is high in saturated fat, such as fatty meat. Desserts and other sweets, sugary drinks, and other foods with added sugar. Full-fat dairy products. Do not salt foods before eating. Do not eat more than 4 egg yolks a week. Try to eat at least 2  vegetarian meals a week. Eat more home-cooked food and less restaurant, buffet, and fast food. Lifestyle When eating at a restaurant, ask that your food be prepared with less salt or no salt, if possible. If you drink alcohol: Limit how much you use to: 0-1 drink a day for women who are not pregnant. 0-2 drinks a day for men. Be aware of how much alcohol is in your drink. In the [place] one drink equals one 12 oz bottle of beer (355 mL), one 5 oz glass of wine (148 mL), or one 1 oz glass of hard liquor (44 mL). General information Avoid eating more than 2,300 mg of salt a day. If you have hypertension, you may need to reduce your sodium intake to 1,500 mg a day. Work with your health care provider to maintain a healthy body weight or to lose weight. Ask what an ideal weight is for you. Get at least 30 minutes of exercise that causes your heart to beat faster (aerobic exercise) most days of the week. Activities may include walking, swimming, or biking. Work with your health care provider or dietitian to adjust your eating plan to your individual  calorie needs. What foods should I eat? Fruits All fresh, dried, or frozen fruit. Canned fruit in natural juice (without added sugar). Vegetables Fresh or frozen vegetables (raw, steamed, roasted, or grilled). Low-sodium or reduced-sodium tomato and vegetable juice. Low-sodium or reduced-sodium tomato sauce and tomato paste. Low-sodium or reduced-sodium canned vegetables. Grains Whole-grain or whole-wheat bread. Whole-grain or whole-wheat pasta. Brown rice. [place]. Bulgur. Whole-grain and low-sodium cereals. Pita bread. Low-fat, low-sodium crackers. Whole-wheat flour tortillas. Meats and other proteins Skinless chicken or [place]. Ground chicken or [place]. Pork with fat trimmed off. Fish and seafood. Egg whites. Dried beans, peas, or lentils. Unsalted nuts, nut butters, and seeds. Unsalted canned beans. Lean cuts of beef with fat trimmed off.  Low-sodium, lean precooked or cured meat, such as sausages or meat loaves. Dairy Low-fat (1%) or fat-free (skim) milk. Reduced-fat, low-fat, or fat-free cheeses. Nonfat, low-sodium ricotta or cottage cheese. Low-fat or nonfat yogurt. Low-fat, low-sodium cheese. Fats and oils Soft margarine without trans fats. Vegetable oil. Reduced-fat, low-fat, or light mayonnaise and salad dressings (reduced-sodium). Canola, safflower, olive, avocado, soybean, and sunflower oils. Avocado. Seasonings and condiments Herbs. Spices. Seasoning mixes without salt. Other foods Unsalted popcorn and pretzels. Fat-free sweets. The items listed above may not be a complete list of foods and beverages you can eat. Contact a dietitian for more information. What foods should I avoid? Fruits Canned fruit in a light or heavy syrup. Fried fruit. Fruit in cream or butter sauce. Vegetables Creamed or fried vegetables. Vegetables in a cheese sauce. Regular canned vegetables (not low-sodium or reduced-sodium). Regular canned tomato sauce and paste (not low-sodium or reduced-sodium). Regular tomato and vegetable juice (not low-sodium or reduced-sodium). Angie Fava. Olives. Grains Baked goods made with fat, such as croissants, muffins, or some breads. Dry pasta or rice meal packs. Meats and other proteins Fatty cuts of meat. Ribs. Fried meat. Berniece Salines. Bologna, salami, and other precooked or cured meats, such as sausages or meat loaves. Fat from the back of a pig (fatback). Bratwurst. Salted nuts and seeds. Canned beans with added salt. Canned or smoked fish. Whole eggs or egg yolks. Chicken or [place] with skin. Dairy Whole or 2% milk, cream, and half-and-half. Whole or full-fat cream cheese. Whole-fat or sweetened yogurt. Full-fat cheese. Nondairy creamers. Whipped toppings. Processed cheese and cheese spreads. Fats and oils Butter. Stick margarine. Lard. Shortening. Ghee. Bacon fat. Tropical oils, such as coconut, palm kernel, or palm  oil. Seasonings and condiments Onion salt, garlic salt, seasoned salt, table salt, and sea salt. Worcestershire sauce. Tartar sauce. Barbecue sauce. Teriyaki sauce. Soy sauce, including reduced-sodium. Steak sauce. Canned and packaged gravies. Fish sauce. Oyster sauce. Cocktail sauce. Store-bought horseradish. Ketchup. Mustard. Meat flavorings and tenderizers. Bouillon cubes. Hot sauces. Pre-made or packaged marinades. Pre-made or packaged taco seasonings. Relishes. Regular salad dressings. Other foods Salted popcorn and pretzels. The items listed above may not be a complete list of foods and beverages you should avoid. Contact a dietitian for more information. Where to find more information National Heart, Lung, and Blood Institute: https://wilson-eaton.com/ American Heart Association: www.heart.org Academy of Nutrition and Dietetics: www.eatright.[place]: www.kidney.org Summary The DASH eating plan is a healthy eating plan that has been shown to reduce high blood pressure (hypertension). It may also reduce your risk for type 2 diabetes, heart disease, and stroke. When on the DASH eating plan, aim to eat more fresh fruits and vegetables, whole grains, lean proteins, low-fat dairy, and heart-healthy fats. With the DASH eating plan, you should limit salt (sodium)  intake to 2,300 mg a day. If you have hypertension, you may need to reduce your sodium intake to 1,500 mg a day. Work with your health care provider or dietitian to adjust your eating plan to your individual calorie needs. This information is not intended to replace advice given to you by your health care provider. Make sure you discuss any questions you have with your health care provider. Document Revised: 01/24/2019 Document Reviewed: 01/24/2019 Elsevier Patient Education  [place]. Hypertension, Adult Hypertension is another name for high blood pressure. High blood pressure forces your heart to work harder to  pump blood. This can cause problems over time. There are two numbers in a blood pressure reading. There is a top number (systolic) over a bottom number (diastolic). It is best to have a blood pressure that is below 120/80. What are the causes? The cause of this condition is not known. Some other conditions can lead to high blood pressure. What increases the risk? Some lifestyle factors can make you more likely to develop high blood pressure: Smoking. Not getting enough exercise or physical activity. Being overweight. Having too much fat, sugar, calories, or salt (sodium) in your diet. Drinking too much alcohol. Other risk factors include: Having any of these conditions: Heart disease. Diabetes. High cholesterol. Kidney disease. Obstructive sleep apnea. Having a family history of high blood pressure and high cholesterol. Age. The risk increases with age. Stress. What are the signs or symptoms? High blood pressure may not cause symptoms. Very high blood pressure (hypertensive crisis) may cause: Headache. Fast or uneven heartbeats (palpitations). Shortness of breath. Nosebleed. Vomiting or feeling like you may vomit (nauseous). Changes in how you see. Very bad chest pain. Feeling dizzy. Seizures. How is this treated? This condition is treated by making healthy lifestyle changes, such as: Eating healthy foods. Exercising more. Drinking less alcohol. Your doctor may prescribe medicine if lifestyle changes do not help enough and if: Your top number is above 130. Your bottom number is above 80. Your personal target blood pressure may vary. Follow these instructions at home: Eating and drinking  If told, follow the DASH eating plan. To follow this plan: Fill one half of your plate at each meal with fruits and vegetables. Fill one fourth of your plate at each meal with whole grains. Whole grains include whole-wheat pasta, brown rice, and whole-grain bread. Eat or drink low-fat  dairy products, such as skim milk or low-fat yogurt. Fill one fourth of your plate at each meal with low-fat (lean) proteins. Low-fat proteins include fish, chicken without skin, eggs, beans, and tofu. Avoid fatty meat, cured and processed meat, or chicken with skin. Avoid pre-made or processed food. Limit the amount of salt in your diet to less than 1,500 mg each day. Do not drink alcohol if: Your doctor tells you not to drink. You are pregnant, may be pregnant, or are planning to become pregnant. If you drink alcohol: Limit how much you have to: 0-1 drink a day for women. 0-2 drinks a day for men. Know how much alcohol is in your drink. In the [place] one drink equals one 12 oz bottle of beer (355 mL), one 5 oz glass of wine (148 mL), or one 1 oz glass of hard liquor (44 mL). Lifestyle  Work with your doctor to stay at a healthy weight or to lose weight. Ask your doctor what the best weight is for you. Get at least 30 minutes of exercise that causes your heart to  beat faster (aerobic exercise) most days of the week. This may include walking, swimming, or biking. Get at least 30 minutes of exercise that strengthens your muscles (resistance exercise) at least 3 days a week. This may include lifting weights or doing Pilates. Do not smoke or use any products that contain nicotine or tobacco. If you need help quitting, ask your doctor. Check your blood pressure at home as told by your doctor. Keep all follow-up visits. Medicines Take over-the-counter and prescription medicines only as told by your doctor. Follow directions carefully. Do not skip doses of blood pressure medicine. The medicine does not work as well if you skip doses. Skipping doses also puts you at risk for problems. Ask your doctor about side effects or reactions to medicines that you should watch for. Contact a doctor if: You think you are having a reaction to the medicine you are taking. You have headaches that keep coming  back. You feel dizzy. You have swelling in your ankles. You have trouble with your vision. Get help right away if: You get a very bad headache. You start to feel mixed up (confused). You feel weak or numb. You feel faint. You have very bad pain in your: Chest. Belly (abdomen). You vomit more than once. You have trouble breathing. These symptoms may be an emergency. Get help right away. Call 911. Do not wait to see if the symptoms will go away. Do not drive yourself to the hospital. Summary Hypertension is another name for high blood pressure. High blood pressure forces your heart to work harder to pump blood. For most people, a normal blood pressure is less than 120/80. Making healthy choices can help lower blood pressure. If your blood pressure does not get lower with healthy choices, you may need to take medicine. This information is not intended to replace advice given to you by your health care provider. Make sure you discuss any questions you have with your health care provider. Document Revised: 12/09/2020 Document Reviewed: 12/09/2020 Elsevier Patient Education  [employer].

## 2021-08-04 NOTE — Chronic Care Management (AMB) (Signed)
Chronic Care Management   CCM RN Visit Note  08/04/2021 Name: Stacie Ellis MRN: [id number] DOB: 1955-08-06  Subjective: Stacie Ellis is a 66 y.o. year old female who is a primary care patient of Olin Hauser, DO. The care management team was consulted for assistance with disease management and care coordination needs.    Engaged with patient by telephone for initial visit in response to provider referral for case management and/or care coordination services.   Consent to Services:  The patient was given the following information about Chronic Care Management services today, agreed to services, and gave verbal consent: 1. CCM service includes personalized support from designated clinical staff supervised by the primary care provider, including individualized plan of care and coordination with other care providers 2. 24/7 contact phone numbers for assistance for urgent and routine care needs. 3. Service will only be billed when office clinical staff spend 20 minutes or more in a month to coordinate care. 4. Only one practitioner may furnish and bill the service in a calendar month. 5.The patient may stop CCM services at any time (effective at the end of the month) by phone call to the office staff. 6. The patient will be responsible for cost sharing (co-pay) of up to 20% of the service fee (after annual deductible is met). Patient agreed to services and consent obtained.  Patient agreed to services and verbal consent obtained.   Assessment: Review of patient past medical history, allergies, medications, health status, including review of consultants reports, laboratory and other test data, was performed as part of comprehensive evaluation and provision of chronic care management services.   SDOH (Social Determinants of Health) assessments and interventions performed:  SDOH Interventions    Flowsheet Row Most Recent Value  SDOH Interventions   Food Insecurity Interventions  Intervention Not Indicated  Financial Strain Interventions Intervention Not Indicated  Housing Interventions Intervention Not Indicated  Stress Interventions Intervention Not Indicated  Social Connections Interventions Intervention Not Indicated  Transportation Interventions Intervention Not Indicated        CCM Care Plan  Allergies  Allergen Reactions   Levaquin [Levofloxacin] Other (See Comments)    Severe tightness in left leg   Penicillins Rash    Outpatient Encounter Medications as of 08/04/2021  Medication Sig   albuterol (PROVENTIL HFA;VENTOLIN HFA) 108 (90 Base) MCG/ACT inhaler Inhale 2 puffs into the lungs every 6 (six) hours as needed for wheezing or shortness of breath.   amLODipine (NORVASC) 2.5 MG tablet Take 1 tablet (2.5 mg total) by mouth 2 (two) times daily.   aspirin 81 MG tablet Take 81 mg by mouth daily.   budesonide-formoterol (SYMBICORT) 80-4.5 MCG/ACT inhaler Inhale 2 puffs into the lungs 2 (two) times daily.   carvedilol (COREG) 25 MG tablet TAKE 1 TABLET BY MOUTH TWICE DAILY WITH MEAL   hydrochlorothiazide (HYDRODIURIL) 25 MG tablet TAKE 1 TABLET BY MOUTH EVERY MORNING   loratadine (CLARITIN) 10 MG tablet Take 10 mg by mouth daily.   montelukast (SINGULAIR) 10 MG tablet TAKE 1 TABLET(10 MG) BY MOUTH AT BEDTIME   pantoprazole (PROTONIX) 40 MG tablet TAKE 1 TABLET BY MOUTH DAILY   telmisartan (MICARDIS) 20 MG tablet Take 1 tablet (20 mg total) by mouth daily.   No facility-administered encounter medications on file as of 08/04/2021.    Patient Active Problem List   Diagnosis Date Noted   Caregiver burden 08/03/2021   Localized swelling of both lower legs 09/08/2019   Dysuria 09/08/2019   Personal  history of colon cancer    Polyp of descending colon    Gastroesophageal reflux disease without esophagitis 04/07/2019   Mild intermittent asthma without complication 92/01/9416   Pre-diabetes 01/16/2018   Hypomagnesemia 01/16/2018   Essential hypertension  08/05/2017   Impaired fasting glucose 08/05/2017   Mixed hyperlipidemia 08/05/2017   Vitamin D deficiency 08/05/2017   Allergic rhinitis due to pollen 06/27/2017   Ventral hernia without obstruction or gangrene 05/29/2016   History of tachycardia 06/29/2014   Skin lesion of chest wall 03/20/2014   Disorder of skin or subcutaneous tissue 03/20/2014   Personal history of colonic polyps 08/08/2013   Encounter for general adult medical examination with abnormal findings 08/08/2013   History of breast cancer 02/11/2013   Malignant neoplasm of upper-outer quadrant of female breast ([place])     Conditions to be addressed/monitored:HTN, HLD, and Caregiver stress and resources for respite services  Care Plan : RNCM: General Plan of Care (Adult) for Chronic Disease Management and Care Coordination Needs  Updates made by Vanita Ingles, RN since 08/04/2021 12:00 AM     Problem: RNCM: Development of plan of care for Chronic Disease Management (HTN, HLD, Caregiver stress)   Priority: High  Onset Date: 08/04/2021     Long-Range Goal: RNCM: Effective Management  of plan of care for Chronic Disease Management (HTN, HLD, Caregiver stress)   Start Date: 08/04/2021  Expected End Date: 08/05/2022  This Visit's Progress: On track  Priority: High  Note:   Current Barriers:  Care Coordination needs related to respite care services for intellectually challenged sister (patients mother and patient are caregivers to patients sister)  Chronic Disease Management support and education needs related to HTN and HLD Resources needs for respite services in [place]):  Patient will verbalize understanding of plan for management of HTN, HLD, and Caregiver stress as evidenced by routine follow up with the pcp and specialist as needed, following plan of care, following dietary restrictions, and working with the CCM team to effectively manage health and well being.  take all medications exactly as  prescribed and will call provider for medication related questions as evidenced by taking medications as ordered and not missing doses, calling for refills before running out of medications.    attend all scheduled medical appointments: with pcp and specialist  as evidenced by keeping appointments and calling for schedule change needs        demonstrate improved and ongoing adherence to prescribed treatment plan for HTN, HLD, and care give stress as evidenced by stable VS, no exacerbations of chronic conditions, and having resources available for respite care and services as needed for patients sister work with Education officer, museum to address caregiver stress and respite services related to the management of HTN, HLD, and care giver stress as evidenced by review of EMR and patient or social worker report     demonstrate ongoing self health care management ability for effective management of chronic conditions as evidenced by  working with the CCM team through collaboration with Consulting civil engineer, provider, and care team.   Interventions: 1:1 collaboration with primary care provider regarding development and update of comprehensive plan of care as evidenced by provider attestation and co-signature Inter-disciplinary care team collaboration (see longitudinal plan of care) Evaluation of current treatment plan related to  self management and patient's adherence to plan as established by provider   Caregiver stress/resources for respite care  (Status: New goal. Goal on Track (progressing): YES.) Long Term  Goal  Evaluation of current treatment plan related to  caregiver stress/resources for respite care ,  caregiver stress related to the care of 16 year old sister with intellectual disabilities  self-management and patient's adherence to plan as established by provider. Discussed plans with patient for ongoing care management follow up and provided patient with direct contact information for care management  team Advised patient to call the office for changes in mood, anxiety, questions, or concerns related to care giver stress or needed resources; Provided education to patient re: [facility] services and resources in [place] for respite services. The patients 50 year old mother cares for her sister Manuela Schwartz who is 72 and intellectually challenged. The patient assist with care also. Wants to know about resources to help with respite services available; Collaborated with pcp and LCSW regarding respite services and caregiver stress. The pcp is active in the care of the patient and patients sister. The patient agrees to talk with the LCSW as well; Provided patient with [facility] booklet by email and phone numbers with website educational materials related to resources in [facility] for respite care and services. Also a careguide referral placed for any additional resources that may be available for respite services.; Reviewed scheduled/upcoming provider appointments including saw pcp on 08-02-2021 and knows to call for changes. The patient will have AWV in July; Social Work referral for assistance with caregiver stress and support for the patient. Has upcoming appointment on 08-09-2021 at 115 pm with LCSW; Discussed plans with patient for ongoing care management follow up and provided patient with direct contact information for care management team; Advised patient to discuss new concerns or questions with provider. Also provided the patient with the RNCM direct number to call for assistance, questions, or concerns; Screening for signs and symptoms of depression related to chronic disease state;  Assessed social determinant of health barriers;   Hyperlipidemia:  (Status: New goal.) Long Term Goal  Lab Results  Component Value Date   CHOL 229 (H) 12/08/2020   HDL 62 12/08/2020   LDLCALC 145 (H) 12/08/2020   TRIG 126 12/08/2020   CHOLHDL 3.7 12/08/2020     Medication review performed; medication  list updated in electronic medical record.  Provider established cholesterol goals reviewed; Counseled on importance of regular laboratory monitoring as prescribed; Provided HLD educational materials; Reviewed importance of limiting foods high in cholesterol;  Hypertension: (Status: New goal. Goal on Track (progressing): YES.) Long Term Goal  Last practice recorded BP readings:  BP Readings from Last 3 Encounters:  08/02/21 124/68  03/14/21 130/78  02/07/21 (!) 156/78  Most recent eGFR/CrCl:  Lab Results  Component Value Date   EGFR 97 12/08/2020    No components found for: CRCL Self reported blood pressure from 08-03-2021: 124/68 Evaluation of current treatment plan related to hypertension self management and patient's adherence to plan as established by provider. 08-04-2021: The patient established care with pcp this week. The patient has been dealing with HTN since her early 20's. She takes her blood pressures on a consistent basis and records. She is receptive to CCM calls and assistance with chronic conditions;   Provided education to patient re: stroke prevention, s/s of heart attack and stroke; Reviewed prescribed diet heart healthy diet- sent healthy eating and low sodium brochure by email to the patient Reviewed medications with patient and discussed importance of compliance;  Discussed plans with patient for ongoing care management follow up and provided patient with direct contact information for care management team; Advised  patient, providing education and rationale, to monitor blood pressure daily and record, calling PCP for findings outside established parameters;  Advised patient to discuss changes in HTN and heart healthy with provider; Provided education on prescribed diet Heart healthy diet, will also send Emmi information and mychart information on HTN health and well being;  Discussed complications of poorly controlled blood pressure such as heart disease, stroke,  circulatory complications, vision complications, kidney impairment, sexual dysfunction;   Patient Goals/Self-Care Activities: Take medications as prescribed   Attend all scheduled provider appointments Call pharmacy for medication refills 3-7 days in advance of running out of medications Attend church or other social activities Perform all self care activities independently  Perform IADL's (shopping, preparing meals, housekeeping, managing finances) independently Call provider office for new concerns or questions  Work with the social worker to address care coordination needs and will continue to work with the clinical team to address health care and disease management related needs call the Suicide and Crisis Lifeline: 988 call the [place] National Suicide Prevention Lifeline: 623-622-5765 or TTY: 812 560 5419 TTY 986-263-3302) to talk to a trained counselor call 1-800-273-TALK (toll free, 24 hour hotline) if experiencing a Mental Health or [place]  check blood pressure 3 times per week choose a place to take my blood pressure (home, clinic or office, retail store) write blood pressure results in a log or diary learn about high blood pressure keep a blood pressure log take blood pressure log to all doctor appointments call doctor for signs and symptoms of high blood pressure develop an action plan for high blood pressure keep all doctor appointments take medications for blood pressure exactly as prescribed report new symptoms to your doctor eat more whole grains, fruits and vegetables, lean meats and healthy fats limit salt intake to 2 grams/day - call for medicine refill 2 or 3 days before it runs out - take all medications exactly as prescribed - call doctor with any symptoms you believe are related to your medicine - call doctor when you experience any new symptoms - go to all doctor appointments as scheduled - adhere to prescribed diet: heart healthy diet -  develop an exercise routine       Plan:Telephone follow up appointment with care management team member scheduled for:  09-08-2021 at 1 pm  [place], MSN, [facility] Mobile: [phone]

## 2021-08-09 DIAGNOSIS — J3081 Allergic rhinitis due to animal (cat) (dog) hair and dander: Secondary | ICD-10-CM | POA: Diagnosis not present

## 2021-08-09 DIAGNOSIS — J301 Allergic rhinitis due to pollen: Secondary | ICD-10-CM | POA: Diagnosis not present

## 2021-08-09 DIAGNOSIS — J3089 Other allergic rhinitis: Secondary | ICD-10-CM | POA: Diagnosis not present

## 2021-08-10 ENCOUNTER — Telehealth: Payer: Self-pay | Admitting: *Deleted

## 2021-08-10 DIAGNOSIS — L209 Atopic dermatitis, unspecified: Secondary | ICD-10-CM | POA: Diagnosis not present

## 2021-08-10 DIAGNOSIS — E063 Autoimmune thyroiditis: Secondary | ICD-10-CM | POA: Diagnosis not present

## 2021-08-10 DIAGNOSIS — E042 Nontoxic multinodular goiter: Secondary | ICD-10-CM | POA: Diagnosis not present

## 2021-08-10 DIAGNOSIS — K219 Gastro-esophageal reflux disease without esophagitis: Secondary | ICD-10-CM | POA: Diagnosis not present

## 2021-08-10 DIAGNOSIS — C50919 Malignant neoplasm of unspecified site of unspecified female breast: Secondary | ICD-10-CM | POA: Diagnosis not present

## 2021-08-10 DIAGNOSIS — I493 Ventricular premature depolarization: Secondary | ICD-10-CM | POA: Diagnosis not present

## 2021-08-10 DIAGNOSIS — E6609 Other obesity due to excess calories: Secondary | ICD-10-CM | POA: Diagnosis not present

## 2021-08-10 DIAGNOSIS — I10 Essential (primary) hypertension: Secondary | ICD-10-CM | POA: Diagnosis not present

## 2021-08-10 NOTE — Telephone Encounter (Signed)
   Telephone encounter was:  Unsuccessful.  08/10/2021 Name: Wilbert Schouten MRN: [id number] DOB: Aug 02, 1955  Unsuccessful outbound call made today to assist with:  Food Insecurity  Outreach Attempt:  2nd Attempt  A HIPAA compliant voice message was left requesting a return call.  Instructed patient to call back at   Instructed patient to call back at [phone]  at their earliest convenience. .  [place], Care Management  [phone] [address] , [place] [address] Email : Ashby Dawes. [email or web address]-[email or web address]

## 2021-08-12 DIAGNOSIS — H2513 Age-related nuclear cataract, bilateral: Secondary | ICD-10-CM | POA: Diagnosis not present

## 2021-08-16 ENCOUNTER — Telehealth: Payer: Self-pay | Admitting: *Deleted

## 2021-08-16 DIAGNOSIS — J3081 Allergic rhinitis due to animal (cat) (dog) hair and dander: Secondary | ICD-10-CM | POA: Diagnosis not present

## 2021-08-16 DIAGNOSIS — J3089 Other allergic rhinitis: Secondary | ICD-10-CM | POA: Diagnosis not present

## 2021-08-16 DIAGNOSIS — J301 Allergic rhinitis due to pollen: Secondary | ICD-10-CM | POA: Diagnosis not present

## 2021-08-16 NOTE — Telephone Encounter (Signed)
   Telephone encounter was:  Successful.  08/16/2021 Name: Stacie Ellis MRN: [id number] DOB: 1955/07/28  Stacie Ellis is a 66 y.o. year old female who is a primary care patient of Olin Hauser, DO . The community resource team was consulted for assistance with Caregiver Stress and services  for mom and sister   Care guide performed the following interventions: Patient provided with information about care guide support team and interviewed to confirm resource needs. Patient  received the information and has no other needs at this time Follow Up Plan:  No further follow up planned at this time. The patient has been provided with needed resources.  [place], Care Management  [phone] [address] , [place] [address] Email : Ashby Dawes. [email or web address]-[email or web address]

## 2021-08-23 DIAGNOSIS — I10 Essential (primary) hypertension: Secondary | ICD-10-CM | POA: Diagnosis not present

## 2021-08-23 DIAGNOSIS — E6609 Other obesity due to excess calories: Secondary | ICD-10-CM | POA: Diagnosis not present

## 2021-08-23 DIAGNOSIS — J3089 Other allergic rhinitis: Secondary | ICD-10-CM | POA: Diagnosis not present

## 2021-08-23 DIAGNOSIS — I493 Ventricular premature depolarization: Secondary | ICD-10-CM | POA: Diagnosis not present

## 2021-08-23 DIAGNOSIS — L209 Atopic dermatitis, unspecified: Secondary | ICD-10-CM | POA: Diagnosis not present

## 2021-08-23 DIAGNOSIS — C50919 Malignant neoplasm of unspecified site of unspecified female breast: Secondary | ICD-10-CM | POA: Diagnosis not present

## 2021-08-23 DIAGNOSIS — E063 Autoimmune thyroiditis: Secondary | ICD-10-CM | POA: Diagnosis not present

## 2021-08-23 DIAGNOSIS — J301 Allergic rhinitis due to pollen: Secondary | ICD-10-CM | POA: Diagnosis not present

## 2021-08-23 DIAGNOSIS — J3081 Allergic rhinitis due to animal (cat) (dog) hair and dander: Secondary | ICD-10-CM | POA: Diagnosis not present

## 2021-08-23 DIAGNOSIS — K219 Gastro-esophageal reflux disease without esophagitis: Secondary | ICD-10-CM | POA: Diagnosis not present

## 2021-08-23 DIAGNOSIS — E042 Nontoxic multinodular goiter: Secondary | ICD-10-CM | POA: Diagnosis not present

## 2021-08-25 ENCOUNTER — Encounter (HOSPITAL_BASED_OUTPATIENT_CLINIC_OR_DEPARTMENT_OTHER): Payer: Self-pay | Admitting: *Deleted

## 2021-08-25 ENCOUNTER — Ambulatory Visit (HOSPITAL_BASED_OUTPATIENT_CLINIC_OR_DEPARTMENT_OTHER): Payer: PPO | Admitting: Family

## 2021-08-25 ENCOUNTER — Telehealth (HOSPITAL_BASED_OUTPATIENT_CLINIC_OR_DEPARTMENT_OTHER): Payer: Self-pay | Admitting: *Deleted

## 2021-08-25 ENCOUNTER — Telehealth (HOSPITAL_BASED_OUTPATIENT_CLINIC_OR_DEPARTMENT_OTHER): Payer: Self-pay

## 2021-08-25 ENCOUNTER — Encounter (HOSPITAL_BASED_OUTPATIENT_CLINIC_OR_DEPARTMENT_OTHER): Payer: Self-pay | Admitting: Family

## 2021-08-25 VITALS — BP 132/80 | HR 81 | Ht 60.0 in | Wt 155.0 lb

## 2021-08-25 DIAGNOSIS — I6523 Occlusion and stenosis of bilateral carotid arteries: Secondary | ICD-10-CM | POA: Diagnosis not present

## 2021-08-25 DIAGNOSIS — I10 Essential (primary) hypertension: Secondary | ICD-10-CM | POA: Diagnosis not present

## 2021-08-25 DIAGNOSIS — G4730 Sleep apnea, unspecified: Secondary | ICD-10-CM

## 2021-08-25 DIAGNOSIS — R0683 Snoring: Secondary | ICD-10-CM | POA: Diagnosis not present

## 2021-08-25 MED ORDER — CHLORTHALIDONE 25 MG PO TABS: 25.0000 mg | ORAL_TABLET | Freq: Every day | ORAL | 3 refills | Status: DC

## 2021-08-25 NOTE — Telephone Encounter (Signed)
[place] sleep study ordered per Laurann Montana, NP. Waiting for determination of approval. If approved, PIN # is [id number].

## 2021-08-25 NOTE — Progress Notes (Signed)
Advanced Hypertension Clinic Initial Assessment:    Date:  08/25/2021   ID:  Devika, Dragovich 1955/04/29, MRN [id number]  PCP:  Olin Hauser, DO  Cardiologist:  None  Nephrologist:  Referring MD: Nobie Putnam *   CC: Hypertension  History of Present Illness:    Stacie Ellis is a 66 y.o. female with a hx of hypertension, carotid stenosis, GERD here to establish care in the Advanced Hypertension Clinic.  Very pleasant lady who lives at home with her husband and has one adult daughter.  Notes family history of hypertension in both her father and brother who were diagnosed in their 30s.  Stacie Ellis was diagnosed with hypertension in her early 19s. It has been overall easy to control.  Did have some difficulty finding beta-blocker due to elevated well.  Atenolol and metoprolol were not effective, propanolol gave her nightmares, she tolerates carvedilol.  Had persistent cough with lisinopril.  She thinks she may have been trialed on hydralazine at one time with palpitations.  Her prior primary care provider did try to get her off telmisartan as it was thought to be causing hoarseness however when she increased amlodipine to 10 mg she had swelling.  Tolerates 5 mg without difficulty.  She has never had secondary hypertension work-up.  Blood pressure checked with arm cuff at home. Readings have been 127-145/70s-80s. For exercise she walks 20-30 minutes three times per week. she eats at home and outside of the home and does follow low sodium diet. She did join [employer] online and has been able to lose 10 pounds since the start of the year.  Reports no shortness of breath nor dyspnea on exertion. Reports no chest pain, pressure, or tightness. No edema, orthopnea, PND. Reports no palpitations.    Previous antihypertensives: Metoprolol (switched to Carvedilol) Propranolol (nightmares) Pindolol Atenolol (not effective) Amlodipine ('10mg'$  had swelling  - tolerates '5mg'$  dose) Lisinopril (cough) ?Hydralazine (palpitations)  Past Medical History:  Diagnosis Date   Asthma    Breast cancer ([place]) 02/2010   GERD (gastroesophageal reflux disease) 2008   Hypertension    Malignant neoplasm of upper-outer quadrant of female breast ([place]) 2011   Intermediate grade DCIS, ER: 50%; PR 10%. left breast lumpectomy, wide excision and a repeat wide excision on 03/10/2010 for multiple positive margins on original resection   Malignant neoplasm of upper-outer quadrant of female breast [facility]) January 2012:    Re-excision to negative margins.    Obesity, unspecified    Postmenopausal bleeding 2013   S/P radiation therapy 2012   whole breast,    Special screening for malignant neoplasms, colon     Past Surgical History:  Procedure Laterality Date   APPENDECTOMY  2008   BREAST BIOPSY Left 2011   pt states had stereo done on mobile unit at Dr. Dwyane Luo office. DCIS   BREAST LUMPECTOMY Left 02/2010   lumpectomy with re excision 03/2010 of left breast for cancer and rad tx   BREAST SURGERY Left 02/17/2010;03/10/2010   lumpectomy and repeat wide excision   CESAREAN SECTION  1998   COLON SURGERY Right 06/13/2006   Hand-assisted right hemicolectomy. 5.8 cm villous adenoma without atypia   COLONOSCOPY  2015   Normal exam.   COLONOSCOPY WITH PROPOFOL N/A 05/29/2019   Procedure: COLONOSCOPY WITH PROPOFOL;  Surgeon: Lucilla Lame, MD;  Location: [facility] ENDOSCOPY;  Service: Endoscopy;  Laterality: N/A;   ESOPHAGOGASTRODUODENOSCOPY (EGD) WITH PROPOFOL N/A 06/21/2015   Procedure: ESOPHAGOGASTRODUODENOSCOPY (EGD) WITH PROPOFOL;  Surgeon:  Hulen Luster, MD;  Location: [facility] ENDOSCOPY;  Service: Gastroenterology;  Laterality: N/A;    Current Medications: Current Meds  Medication Sig   albuterol (PROVENTIL HFA;VENTOLIN HFA) 108 (90 Base) MCG/ACT inhaler Inhale 2 puffs into the lungs every 6 (six) hours as needed for wheezing or shortness of breath.   amLODipine (NORVASC) 2.5  MG tablet Take 1 tablet (2.5 mg total) by mouth 2 (two) times daily.   aspirin 81 MG tablet Take 81 mg by mouth daily.   budesonide-formoterol (SYMBICORT) 80-4.5 MCG/ACT inhaler Inhale 2 puffs into the lungs 2 (two) times daily.   carvedilol (COREG) 25 MG tablet TAKE 1 TABLET BY MOUTH TWICE DAILY WITH MEAL   chlorthalidone (HYGROTON) 25 MG tablet Take 1 tablet (25 mg total) by mouth daily.   Cholecalciferol (VITAMIN D3) 50 MCG (2000 UT) capsule Take 2 capsules (4,000 Units total) by mouth daily.   loratadine (CLARITIN) 10 MG tablet Take 10 mg by mouth daily.   Magnesium Glycinate 100 MG CAPS Take 200 mg by mouth daily.   montelukast (SINGULAIR) 10 MG tablet TAKE 1 TABLET(10 MG) BY MOUTH AT BEDTIME   pantoprazole (PROTONIX) 40 MG tablet TAKE 1 TABLET BY MOUTH DAILY   [DISCONTINUED] hydrochlorothiazide (HYDRODIURIL) 25 MG tablet TAKE 1 TABLET BY MOUTH EVERY MORNING   [DISCONTINUED] telmisartan (MICARDIS) 20 MG tablet Take 1 tablet (20 mg total) by mouth daily.     Allergies:   Levaquin [levofloxacin] and Penicillins   Social History   Socioeconomic History   Marital status: Married    Spouse name: Not on file   Number of children: Not on file   Years of education: Not on file   Highest education level: Not on file  Occupational History   Not on file  Tobacco Use   Smoking status: Never   Smokeless tobacco: Never  Vaping Use   Vaping Use: Never used  Substance and Sexual Activity   Alcohol use: No   Drug use: No   Sexual activity: Yes    Birth control/protection: Post-menopausal  Other Topics Concern   Not on file  Social History Narrative   Not on file   Social Determinants of Health   Financial Resource Strain: Low Risk  (08/25/2021)   Overall Financial Resource Strain (CARDIA)    Difficulty of Paying Living Expenses: Not hard at all  Food Insecurity: No Food Insecurity (08/04/2021)   Hunger Vital Sign    Worried About Running Out of Food in the Last Year: Never true     [place] in the Last Year: Never true  Transportation Needs: No Transportation Needs (08/25/2021)   PRAPARE - Transportation    Lack of Transportation (Medical): No    Lack of Transportation (Non-Medical): No  Physical Activity: Not on file  Stress: No Stress Concern Present (08/04/2021)   [place]    Feeling of Stress : Only a little  Social Connections: Socially Integrated (08/25/2021)   Social Connection and Isolation Panel [NHANES]    Frequency of Communication with Friends and Family: More than three times a week    Frequency of Social Gatherings with Friends and Family: More than three times a week    Attends Religious Services: More than 4 times per year    Active Member of [employer] or Organizations: Yes    Attends Music therapist: More than 4 times per year    Marital Status: Married     Family  History: The patient's family history includes Cancer in her father and another family member; Colon polyps in an other family member; Hypertension in her brother and father. There is no history of Breast cancer.  ROS:   Please see the history of present illness.     All other systems reviewed and are negative.  EKGs/Labs/Other Studies Reviewed:    EKG:  EKG is ordered today.  The ekg ordered today demonstrates NSR 81 bpm with no acute ST/ T wave changes.   Recent Labs: 12/08/2020: ALT 14; BUN 13; Creatinine, Ser 0.66; Hemoglobin 13.4; Platelets 219; Potassium 4.1; Sodium 141; TSH 1.590   Recent Lipid Panel    Component Value Date/Time   CHOL 229 (H) 12/08/2020 1136   TRIG 126 12/08/2020 1136   HDL 62 12/08/2020 1136   CHOLHDL 3.7 12/08/2020 1136   LDLCALC 145 (H) 12/08/2020 1136    Physical Exam:   VS:  BP 132/80   Pulse 81   Ht 5' (1.524 m)   Wt 155 lb (70.3 kg)   BMI 30.27 kg/m  , BMI Body mass index is 30.27 kg/m. GENERAL:  Well appearing HEENT: Pupils equal round and reactive, fundi  not visualized, oral mucosa unremarkable NECK:  No jugular venous distention, waveform within normal limits, carotid upstroke brisk and symmetric, no bruits, no thyromegaly LYMPHATICS:  No cervical adenopathy LUNGS:  Clear to auscultation bilaterally HEART:  RRR.  PMI not displaced or sustained,S1 and S2 within normal limits, no S3, no S4, no clicks, no rubs,  murmurs ABD:  Flat, positive bowel sounds normal in frequency in pitch, no bruits, no rebound, no guarding, no midline pulsatile mass, no hepatomegaly, no splenomegaly EXT:  2 plus pulses throughout, no edema, no cyanosis no clubbing SKIN:  No rashes no nodules NEURO:  Cranial nerves II through XII grossly intact, motor grossly intact throughout PSYCH:  Cognitively intact, oriented to person place and time   ASSESSMENT/PLAN:    HTN - Diagnosed in her 35s.  Notes hoarseness since starting telmisartan which is worsened.  She is interested in being off this medication.  We will discontinue telmisartan.  Stop hydrochlorothiazide and transition to chlorthalidone 25 mg daily.  Renal and aldosterone, BMP, magnesium in 1 week. If hoarseness does not resolve off Telmisartan could utilize ARB in the future.  Sleep study to rule out OSA as secondary cause. Renal artery duplex to rule out secondary cause.  She politely declines to participate in research study.   Politely declines to enroll in PrEP as this too far for her home but she does have a good walking regimen.  Snores /sleep disordered breathing -Itamar WatchPAT provided in clinic due to snoring, nonrestorative sleep. STOP [place] 5.  Carotid artery stenosis -prior carotid duplex October 2021 with bilateral less than 50% stenosis with minimal plaque formation on the left and right.  No lightheadedness, dizziness, amaurosis fugax.   Screening for Secondary Hypertension:     08/25/2021    2:09 PM  Causes  Renovascular HTN Screened     - Comments Renal duplex ordered 08/25/2021  Sleep Apnea  Screened     - Comments Home sleep study 08/25/21  Thyroid Disease Screened  Hyperaldosteronism Screened     - Comments Renal and aldosterone ordered 08/25/2021  Cushing's Syndrome N/A     - Comments Non cushingoid appearance  Coarctation of the Aorta Screened     - Comments 12/2019 carotid duplex  Compliance Screened    Relevant Labs/Studies:    Latest Ref  Rng & Units 12/08/2020   11:36 AM 09/04/2019   11:32 AM 07/09/2018    1:07 PM  Basic Labs  Sodium 134 - 144 mmol/L 141  138  140   Potassium 3.5 - 5.2 mmol/L 4.1  4.1  3.8   Creatinine 0.57 - 1.00 mg/dL 0.66  0.69  0.75        Latest Ref Rng & Units 12/08/2020   11:36 AM 09/04/2019   11:32 AM  Thyroid   TSH 0.450 - 4.500 uIU/mL 1.590  1.850                 08/25/2021   10:06 AM  Renovascular   Renal Artery [place] Completed Yes     Disposition:    FU with MD/PharmD/APP in 1 month    Medication Adjustments/Labs and Tests Ordered: Current medicines are reviewed at length with the patient today.  Concerns regarding medicines are outlined above.  Orders Placed This Encounter  Procedures   Aldosterone + renin activity w/ ratio   Basic metabolic panel   Magnesium   EKG 12-Lead   Itamar Sleep Study   VAS US RENAL ARTERY DUPLEX   Meds ordered this encounter  Medications   chlorthalidone (HYGROTON) 25 MG tablet    Sig: Take 1 tablet (25 mg total) by mouth daily.    Dispense:  90 tablet    Refill:  3    D/C TELMISARTAN AND HCTZ     Signed, Loel Dubonnet, NP  08/25/2021 2:11 PM    [facility]

## 2021-08-25 NOTE — Telephone Encounter (Signed)
Mychart message sent to patient ok to activate Itamar, code [phone]

## 2021-08-25 NOTE — Patient Instructions (Addendum)
Medication Instructions:  STOP TELMISARTAN   STOP HYDROCHLOROTHIAZIDE  START CHLORTHALIDONE 25 MG DAILY    Labwork: RENIN/ALDOSTERONE/BMET/MAGNESIUM IN 1 WEEK THESE NEED TO BE DONE FIRST THING IN THE MORNING  Testing/Procedures: Your physician has requested that you have a renal artery duplex. During this test, an ultrasound is used to evaluate blood flow to the kidneys. Allow one hour for this exam. Do not eat after midnight the day before and avoid carbonated beverages. Take your medications as you usually do.  HOME ITAMAR SLEEP STUDY, INSTRUCTIONS BELOW   Follow-Up: 09/27/2021 11:00 AM WITH PHARM D AT [facility] OFFICE   Any Other Special Instructions Will Be Listed Below (If Applicable).  MONITOR YOUR BLOOD PRESSURE TWICE A DAY, LOG IN THE BOOK PROVIDED. BRING THE BOOK AND YOUR BLOOD PRESSURE MACHINE TO YOUR FOLLOW UP IN 1 MONTH   WatchPAT?  Is a FDA cleared portable home sleep study test that uses a watch and 3 points of contact to monitor 7 different channels, including your heart rate, oxygen saturations, body position, snoring, and chest motion.  The study is easy to use from the comfort of your own home and accurately detect sleep apnea.  Before bed, you attach the chest sensor, attached the sleep apnea bracelet to your nondominant hand, and attach the finger probe.  After the study, the raw data is downloaded from the watch and scored for apnea events.   For more information: [email or web address]   Patient Testing Instructions:  Do not put battery into the device until bedtime when you are ready to begin the test. Please call the support number if you need assistance after following the instructions below: 24 hour support line- [phone] or ITAMAR support at [phone] (option 2)  Download the [employer] AmericanWatchPAT One" app through the google play store or App Store  Be sure to turn on or enable access to bluetooth in [place] on your smartphone/ device   Make sure no other bluetooth devices are on and within the vicinity of your smartphone/ device and WatchPAT watch during testing.  Make sure to leave your smart phone/ device plugged in and charging all night.  When ready for bed:  Follow the instructions step by step in the WatchPAT One App to activate the testing device. For additional instructions, including video instruction, visit the WatchPAT One video on Youtube. You can search for [place] One within Youtube (video is 4 minutes and 18 seconds) or enter: https://youtube/watch?[email or web address]_vbiwxE Please note: You will be prompted to enter a Pin to connect via bluetooth when starting the test. The PIN will be assigned to you when you receive the test.  The device is disposable, but it recommended that you retain the device until you receive a call letting you know the study has been received and the results have been interpreted.  We will let you know if the study did not transmit to [place] properly after the test is completed. You do not need to call us to confirm the receipt of the test.  Please complete the test within 48 hours of receiving PIN.    Frequently Asked Questions:  What is Watch [place] one?  A single use fully disposable home sleep apnea testing device and will not need to be returned after completion.  What are the requirements to use WatchPAT one?  The be able to have a successful watchpat one sleep study, you should have your Watch pat one device, your smart phone, watch pat one app, your PIN number and Internet access  What type of phone do I need?  You should have a smart phone that uses Android 5.1 and above or any Iphone with IOS 10 and above How can I download the WatchPAT one app?  Based on your device type search for WatchPAT one app either in google play for android devices or APP store for Iphone's Where will I get my PIN for the study?  Your PIN will be provided by your physician's office. It is used for authentication and if  you lose/forget your PIN, please reach out to your providers office.  I do not have Internet at home. Can I do WatchPAT one study?  WatchPAT One needs Internet connection throughout the night to be able to transmit the sleep data. You can use your home/local internet or your cellular's data package. However, it is always recommended to use home/local Internet. It is estimated that between 20MB-30MB will be used with each study.However, the application will be looking for 80MB space in the phone to start the study.  What happens if I lose internet or bluetooth connection?  During the internet disconnection, your phone will not be able to transmit the sleep data. All the data, will be stored in your phone. As soon as the internet connection is back on, the phone will being sending the sleep data. During the bluetooth disconnection, WatchPAT one will not be able to to send the sleep data to your phone. Data will be kept in the [facility] one until two devices have bluetooth connection back on. As soon as the connection is back on, WatchPAT one will send the sleep data to the phone.  How long do I need to wear the WatchPAT one?  After you start the study, you should wear the device at least 6 hours.  How far should I keep my phone from the device?  During the night, your phone should be within 15 feet.  What happens if I leave the room for restroom or other reasons?  Leaving the room for any reason will not cause any problem. As soon as your get back to the room, both devices will reconnect and will continue to send the sleep data. Can I use my phone during the sleep study?  Yes, you can use your phone as usual during the study. But it is recommended to put your watchpat one on when you are ready to go to bed.  How will I get my study results?  A soon as you completed your study, your sleep data will be sent to the provider. They will then share the results with you when they are ready.

## 2021-08-30 DIAGNOSIS — J3089 Other allergic rhinitis: Secondary | ICD-10-CM | POA: Diagnosis not present

## 2021-08-30 DIAGNOSIS — J301 Allergic rhinitis due to pollen: Secondary | ICD-10-CM | POA: Diagnosis not present

## 2021-08-30 DIAGNOSIS — J3081 Allergic rhinitis due to animal (cat) (dog) hair and dander: Secondary | ICD-10-CM | POA: Diagnosis not present

## 2021-08-31 ENCOUNTER — Encounter (INDEPENDENT_AMBULATORY_CARE_PROVIDER_SITE_OTHER): Payer: PPO | Admitting: Cardiology

## 2021-08-31 DIAGNOSIS — G4733 Obstructive sleep apnea (adult) (pediatric): Secondary | ICD-10-CM

## 2021-09-01 ENCOUNTER — Ambulatory Visit: Payer: PPO

## 2021-09-01 DIAGNOSIS — I10 Essential (primary) hypertension: Secondary | ICD-10-CM

## 2021-09-01 DIAGNOSIS — R0683 Snoring: Secondary | ICD-10-CM

## 2021-09-01 DIAGNOSIS — G4730 Sleep apnea, unspecified: Secondary | ICD-10-CM

## 2021-09-01 NOTE — Procedures (Signed)
   SLEEP STUDY REPORT Patient Information Study Date: 09/01/21 Patient Name: Stacie Ellis Patient ID: [id number] Birth Date: [birth year] Age: 66 Gender: Female BMI: 30.3 (W=154 lb, H=5' 0'') Referring Physician: Laurann Montana, NP  TEST DESCRIPTION: Home sleep apnea testing was completed using the WatchPat, a Type 1 device, utilizing peripheral arterial tonometry (PAT), chest movement, actigraphy, pulse oximetry, pulse rate, body position and snore. AHI was calculated with apnea and hypopnea using valid sleep time as the denominator. RDI includes apneas, hypopneas, and RERAs. The data acquired and the scoring of sleep and all associated events were performed in accordance with the recommended standards and specifications as outlined in the AASM Manual for the Scoring of Sleep and Associated Events 2.2.0 (2015).  FINDINGS: 1. Mild Obstructive Sleep Apnea with AHI 14.7/hr. 2. No Central Sleep Apnea with pAHIc 1.4/hr. 3. Oxygen desaturations as low as 81%. 4. Mild snoring was present. O2 sats were < 88% for 8.5 min. 5. Total sleep time was 6 hrs and 28 min. 6. 28.3% of total sleep time was spent in REM sleep. 7. sleep onset latency at 5 min. 8. REM sleep onset latency at 69 min. 9. Total awakenings were 21.  DIAGNOSIS: Mild Obstructive Sleep Apnea (G47.33) Nocturnal Hypoxemia  RECOMMENDATIONS: 1. Clinical correlation of these findings is necessary. The decision to treat obstructive sleep apnea (OSA) is usually based on the presence of apnea symptoms or the presence of associated medical conditions such as Hypertension, Congestive Heart Failure, Atrial Fibrillation or Obesity. The most common symptoms of OSA are snoring, gasping for breath while sleeping, daytime sleepiness and fatigue.  2. Initiating apnea therapy is recommended given the presence of symptoms and/or associated conditions. Recommend proceeding with one of the following:   a. Auto-CPAP therapy with a pressure range  of 5-20cm H2O.   b. An oral appliance (OA) that can be obtained from certain dentists with expertise in sleep medicine. These are primarily of use in non-obese patients with mild and moderate disease.   c. An ENT consultation which may be useful to look for specific causes of obstruction and possible treatment options.   d. If patient is intolerant to PAP therapy, consider referral to ENT for evaluation for hypoglossal nerve stimulator.  3. Close follow-up is necessary to ensure success with CPAP or oral appliance therapy for maximum benefit .  4. A follow-up oximetry study on CPAP is recommended to assess the adequacy of therapy and determine the need for supplemental oxygen or the potential need for Bi-level therapy. An arterial blood gas to determine the adequacy of baseline ventilation and oxygenation should also be considered.  5. Healthy sleep recommendations include: adequate nightly sleep (normal 7-9 hrs/night), avoidance of caffeine after noon and alcohol near bedtime, and maintaining a sleep environment that is cool, dark and quiet.  6. Weight loss for overweight patients is recommended. Even modest amounts of weight loss can significantly improve the severity of sleep apnea.  7. Snoring recommendations include: weight loss where appropriate, side sleeping, and avoidance of alcohol before bed.  8. Operation of motor vehicle should be avoided when sleepy.  Signature: Electronically Signed: 09/01/21 Fransico Him, MD; [facility]; [place], American Board of Sleep Medicine Report prepared by: Fransico Him

## 2021-09-02 ENCOUNTER — Telehealth (HOSPITAL_BASED_OUTPATIENT_CLINIC_OR_DEPARTMENT_OTHER): Payer: Self-pay

## 2021-09-02 ENCOUNTER — Other Ambulatory Visit
Admission: RE | Admit: 2021-09-02 | Discharge: 2021-09-02 | Disposition: A | Discharge status: Home / Self Care | Payer: PPO | Attending: Family | Admitting: Family

## 2021-09-02 DIAGNOSIS — E785 Hyperlipidemia, unspecified: Secondary | ICD-10-CM

## 2021-09-02 DIAGNOSIS — I10 Essential (primary) hypertension: Secondary | ICD-10-CM

## 2021-09-02 LAB — MAGNESIUM: Magnesium: 2 mg/dL (ref 1.7–2.4)

## 2021-09-02 LAB — BASIC METABOLIC PANEL
Anion gap: 9 (ref 5–15)
BUN: 15 mg/dL (ref 8–23)
CO2: 29 mmol/L (ref 22–32)
Calcium: 9.3 mg/dL (ref 8.9–10.3)
Chloride: 102 mmol/L (ref 98–111)
Creatinine, Ser: 0.68 mg/dL (ref 0.44–1.00)
GFR, Estimated: >60 mL/min (ref >60)
Glucose, Bld: 101 mg/dL — ABNORMAL HIGH (ref 70–99)
Potassium: 3.2 mmol/L — ABNORMAL LOW (ref 3.5–5.1)
Sodium: 140 mmol/L (ref 135–145)

## 2021-09-02 MED ORDER — POTASSIUM CHLORIDE CRYS ER 10 MEQ PO TBCR: 10.0000 meq | EXTENDED_RELEASE_TABLET | Freq: Every day | ORAL | 3 refills | Status: DC

## 2021-09-02 NOTE — Addendum Note (Signed)
Addended by: Antonieta Iba C on: 09/02/2021 11:38 AM   Modules accepted: Orders

## 2021-09-02 NOTE — Telephone Encounter (Addendum)
Seen by patient Stacie Ellis on 09/02/2021  2:19 PM, labs ordered and mailed to patient with follow up mychart message to confirm with patient and get BP log   ----- Message from Loel Dubonnet, NP sent at 09/02/2021  1:34 PM EDT ----- Normal kidney function.  Magnesium normal.  Potassium mildly low.  This is likely due to the chlorthalidone.  Recommend potassium 20 mEq daily x 3 days then 10 mEq QD with repeat BMP in 1 week.   Can we ask her for a log of recent BP?

## 2021-09-02 NOTE — Addendum Note (Signed)
Addended by: Gerald Stabs on: 09/02/2021 04:19 PM   Modules accepted: Orders

## 2021-09-05 ENCOUNTER — Telehealth: Payer: Self-pay | Admitting: *Deleted

## 2021-09-05 NOTE — Telephone Encounter (Signed)
Patient returned a call to me and was given her sleep study results and recommendations. She is already vaguely familiar with OSA and CPAP due to having both a brother and sister that uses CPAP. She wants to think about starting CPAP and contact us back when she has made her decision.

## 2021-09-05 NOTE — Telephone Encounter (Signed)
-----   Message from Sueanne Margarita, MD sent at 09/01/2021  1:40 PM EDT ----- Please let patient know that they have sleep apnea and recommend treating with CPAP.  Please order an auto CPAP from 4-15cm H2O with heated humidity and mask of choice.  Order overnight pulse ox on CPAP.  Followup with me in 6 weeks.

## 2021-09-05 NOTE — Telephone Encounter (Signed)
Blood pressure readings overall look good! Any improvement with hoarseness with discontinuation of Telmisartan? May be too soon to tell. Nail is not of concern and looks normal. Would not be related to her blood pressure or blood pressure medications.   Loel Dubonnet, NP

## 2021-09-05 NOTE — Telephone Encounter (Signed)
BP log as requested 

## 2021-09-05 NOTE — Telephone Encounter (Signed)
Left message to return a call to discuss sleep study results and recommendations. 

## 2021-09-08 ENCOUNTER — Telehealth: Payer: PPO

## 2021-09-08 ENCOUNTER — Telehealth: Payer: Self-pay

## 2021-09-08 ENCOUNTER — Ambulatory Visit (INDEPENDENT_AMBULATORY_CARE_PROVIDER_SITE_OTHER): Payer: PPO

## 2021-09-08 DIAGNOSIS — J3081 Allergic rhinitis due to animal (cat) (dog) hair and dander: Secondary | ICD-10-CM | POA: Diagnosis not present

## 2021-09-08 DIAGNOSIS — J301 Allergic rhinitis due to pollen: Secondary | ICD-10-CM | POA: Diagnosis not present

## 2021-09-08 DIAGNOSIS — J3089 Other allergic rhinitis: Secondary | ICD-10-CM | POA: Diagnosis not present

## 2021-09-08 DIAGNOSIS — Z636 Dependent relative needing care at home: Secondary | ICD-10-CM

## 2021-09-08 DIAGNOSIS — E782 Mixed hyperlipidemia: Secondary | ICD-10-CM

## 2021-09-08 DIAGNOSIS — I10 Essential (primary) hypertension: Secondary | ICD-10-CM

## 2021-09-08 NOTE — Telephone Encounter (Signed)
  Care Management   Follow Up Note   09/08/2021 Name: Stacie Ellis MRN: [id number] DOB: 05/31/1955   Referred by: Olin Hauser, DO Reason for referral : Chronic Care Management (RNCM: Follow up for Chronic Disease Management and Care Coordination Needs/Attempt)   An unsuccessful telephone outreach was attempted today. The patient was referred to the case management team for assistance with care management and care coordination.   Follow Up Plan: A HIPPA compliant phone message was left for the patient providing contact information and requesting a return call.   Noreene Larsson RN, MSN, [place] Mobile: [phone]

## 2021-09-08 NOTE — Chronic Care Management (AMB) (Signed)
Chronic Care Management   CCM RN Visit Note  09/08/2021 Name: Stacie Ellis MRN: [id number] DOB: 1956/02/07  Subjective: Stacie Ellis is a 66 y.o. year old female who is a primary care patient of Olin Hauser, DO. The care management team was consulted for assistance with disease management and care coordination needs.    Engaged with patient by telephone for follow up visit in response to provider referral for case management and/or care coordination services.   Consent to Services:  The patient was given information about Chronic Care Management services, agreed to services, and gave verbal consent prior to initiation of services.  Please see initial visit note for detailed documentation.   Patient agreed to services and verbal consent obtained.   Assessment: Review of patient past medical history, allergies, medications, health status, including review of consultants reports, laboratory and other test data, was performed as part of comprehensive evaluation and provision of chronic care management services.   SDOH (Social Determinants of Health) assessments and interventions performed:    CCM Care Plan  Allergies  Allergen Reactions   Levaquin [Levofloxacin] Other (See Comments)    Severe tightness in left leg   Penicillins Rash    Outpatient Encounter Medications as of 09/08/2021  Medication Sig   albuterol (PROVENTIL HFA;VENTOLIN HFA) 108 (90 Base) MCG/ACT inhaler Inhale 2 puffs into the lungs every 6 (six) hours as needed for wheezing or shortness of breath.   amLODipine (NORVASC) 2.5 MG tablet Take 1 tablet (2.5 mg total) by mouth 2 (two) times daily.   aspirin 81 MG tablet Take 81 mg by mouth daily.   budesonide-formoterol (SYMBICORT) 80-4.5 MCG/ACT inhaler Inhale 2 puffs into the lungs 2 (two) times daily.   carvedilol (COREG) 25 MG tablet TAKE 1 TABLET BY MOUTH TWICE DAILY WITH MEAL   chlorthalidone (HYGROTON) 25 MG tablet Take 1 tablet (25 mg total)  by mouth daily.   Cholecalciferol (VITAMIN D3) 50 MCG (2000 UT) capsule Take 2 capsules (4,000 Units total) by mouth daily.   loratadine (CLARITIN) 10 MG tablet Take 10 mg by mouth daily.   Magnesium Glycinate 100 MG CAPS Take 200 mg by mouth daily.   montelukast (SINGULAIR) 10 MG tablet TAKE 1 TABLET(10 MG) BY MOUTH AT BEDTIME   pantoprazole (PROTONIX) 40 MG tablet TAKE 1 TABLET BY MOUTH DAILY   potassium chloride SA (KLOR-CON M) 10 MEQ tablet Take 1 tablet (10 mEq total) by mouth daily.   No facility-administered encounter medications on file as of 09/08/2021.    Patient Active Problem List   Diagnosis Date Noted   Caregiver burden 08/03/2021   Localized swelling of both lower legs 09/08/2019   Dysuria 09/08/2019   Personal history of colon cancer    Polyp of descending colon    Gastroesophageal reflux disease without esophagitis 04/07/2019   Mild intermittent asthma without complication 16/60/6301   Pre-diabetes 01/16/2018   Hypomagnesemia 01/16/2018   Essential hypertension 08/05/2017   Impaired fasting glucose 08/05/2017   Mixed hyperlipidemia 08/05/2017   Vitamin D deficiency 08/05/2017   Allergic rhinitis due to pollen 06/27/2017   Ventral hernia without obstruction or gangrene 05/29/2016   History of tachycardia 06/29/2014   Skin lesion of chest wall 03/20/2014   Disorder of skin or subcutaneous tissue 03/20/2014   Personal history of colonic polyps 08/08/2013   Encounter for general adult medical examination with abnormal findings 08/08/2013   History of breast cancer 02/11/2013   Malignant neoplasm of upper-outer quadrant of female breast ([place])  Conditions to be addressed/monitored:HTN, HLD, and caregiver stress  Care Plan : RNCM: General Plan of Care (Adult) for Chronic Disease Management and Care Coordination Needs  Updates made by Vanita Ingles, RN since 09/08/2021 12:00 AM     Problem: RNCM: Development of plan of care for Chronic Disease Management (HTN, HLD,  Caregiver stress)   Priority: High  Onset Date: 08/04/2021     Long-Range Goal: RNCM: Effective Management  of plan of care for Chronic Disease Management (HTN, HLD, Caregiver stress)   Start Date: 08/04/2021  Expected End Date: 08/05/2022  Recent Progress: On track  Priority: High  Note:   Current Barriers:  Care Coordination needs related to respite care services for intellectually challenged sister (patients mother and patient are caregivers to patients sister)  Chronic Disease Management support and education needs related to HTN and HLD Resources needs for respite services in [place]):  Patient will verbalize understanding of plan for management of HTN, HLD, and Caregiver stress as evidenced by routine follow up with the pcp and specialist as needed, following plan of care, following dietary restrictions, and working with the CCM team to effectively manage health and well being.  take all medications exactly as prescribed and will call provider for medication related questions as evidenced by taking medications as ordered and not missing doses, calling for refills before running out of medications.    attend all scheduled medical appointments: with pcp and specialist  as evidenced by keeping appointments and calling for schedule change needs        demonstrate improved and ongoing adherence to prescribed treatment plan for HTN, HLD, and care give stress as evidenced by stable VS, no exacerbations of chronic conditions, and having resources available for respite care and services as needed for patients sister work with Education officer, museum to address caregiver stress and respite services related to the management of HTN, HLD, and care giver stress as evidenced by review of EMR and patient or social worker report     demonstrate ongoing self health care management ability for effective management of chronic conditions as evidenced by  working with the CCM team through  collaboration with Consulting civil engineer, provider, and care team.   Interventions: 1:1 collaboration with primary care provider regarding development and update of comprehensive plan of care as evidenced by provider attestation and co-signature Inter-disciplinary care team collaboration (see longitudinal plan of care) Evaluation of current treatment plan related to  self management and patient's adherence to plan as established by provider   Caregiver stress/resources for respite care  (Status: Goal on Track (progressing): YES.) Long Term Goal  Evaluation of current treatment plan related to caregiver stress/resources for respite care, caregiver stress related to the care of 109 year old sister with intellectual disabilities self-management and patient's adherence to plan as established by provider. 09-08-2021: The patient is thankful for the support of the CCM team. She has received the information from the care guides and RNCM and this has been helpful. She also ask about [place] services in the area. Gave the information to the patient about [facility] at [phone]. She is wanting to see if there are [facility] resources in the area. Education and support given.  Discussed plans with patient for ongoing care management follow up and provided patient with direct contact information for care management team Advised patient to call the office for changes in mood, anxiety, questions, or concerns related to care giver stress or needed resources; Provided  education to patient re: [facility] services and resources in [place] for respite services. The patients 82 year old mother cares for her sister Manuela Schwartz who is 53 and intellectually challenged. The patient assist with care also. Wants to know about resources to help with respite services available. 09-08-2021: Review of resources and the patient is thankful for the available resources. The patient states she has noticed her sister is more hyper than usual and this is  concerning to her. She says she wants to explore other options as the ones currently they have tried are not working. Education and support given.  Collaborated with pcp and LCSW regarding respite services and caregiver stress. The pcp is active in the care of the patient and patients sister. The patient agrees to talk with the LCSW as well; Provided patient with [facility] booklet by email and phone numbers with website educational materials related to resources in [facility] for respite care and services. Also a careguide referral placed for any additional resources that may be available for respite services.; Reviewed scheduled/upcoming provider appointments including saw pcp on 08-02-2021 and knows to call for changes. The patient will have AWV in July; Social Work referral for assistance with caregiver stress and support for the patient. Has upcoming appointment on 08-09-2021 at 115 pm with LCSW; Discussed plans with patient for ongoing care management follow up and provided patient with direct contact information for care management team; Advised patient to discuss new concerns or questions with provider. Also provided the patient with the RNCM direct number to call for assistance, questions, or concerns; Screening for signs and symptoms of depression related to chronic disease state;  Assessed social determinant of health barriers;   Hyperlipidemia:  (Status: Goal on Track (progressing): YES.) Long Term Goal  Lab Results  Component Value Date   CHOL 229 (H) 12/08/2020   HDL 62 12/08/2020   LDLCALC 145 (H) 12/08/2020   TRIG 126 12/08/2020   CHOLHDL 3.7 12/08/2020     Medication review performed; medication list updated in electronic medical record.  Provider established cholesterol goals reviewed; Counseled on importance of regular laboratory monitoring as prescribed. 09-08-2021: The patient has regular lab work ; Provided HLD Scientist, clinical (histocompatibility and immunogenetics); Reviewed importance of limiting foods  high in cholesterol. 09-08-2021: The patient is compliant with a heart healthy diet.  Hypertension: (Status: Goal on Track (progressing): YES.) Long Term Goal  Last practice recorded BP readings:  BP Readings from Last 3 Encounters:  08/25/21 132/80  08/02/21 124/68  03/14/21 130/78  Most recent eGFR/CrCl:  Lab Results  Component Value Date   EGFR 97 12/08/2020    No components found for: CRCL Self reported blood pressure from 08-03-2021: 124/68 Evaluation of current treatment plan related to hypertension self management and patient's adherence to plan as established by provider. 08-04-2021: The patient established care with pcp this week. The patient has been dealing with HTN since her early 20's. She takes her blood pressures on a consistent basis and records. She is receptive to CCM calls and assistance with chronic conditions. 09-08-2021: The patient is working with the HTN clinic and cardiologist on helping with management of her blood pressures. Blood pressures are trending down and the patient is happy about this. The patient is taking at home. Has not seen an improvement in the hoarseness but has in other areas. Over all she is pleases with the small changes. Is mindful of her blood pressures and proactive in her care.;   Provided education to patient re: stroke  prevention, s/s of heart attack and stroke; Reviewed prescribed diet heart healthy diet- sent healthy eating and low sodium brochure by email to the patient Reviewed medications with patient and discussed importance of compliance. 09-08-2021: The patient is doing well and has had medication changes. Is compliant with her medications.;  Discussed plans with patient for ongoing care management follow up and provided patient with direct contact information for care management team; Advised patient, providing education and rationale, to monitor blood pressure daily and record, calling PCP for findings outside established parameters;  Advised  patient to discuss changes in HTN and heart healthy with provider; Provided education on prescribed diet Heart healthy diet, will also send Emmi information and mychart information on HTN health and well being;  Discussed complications of poorly controlled blood pressure such as heart disease, stroke, circulatory complications, vision complications, kidney impairment, sexual dysfunction;   Patient Goals/Self-Care Activities: Take medications as prescribed   Attend all scheduled provider appointments Call pharmacy for medication refills 3-7 days in advance of running out of medications Attend church or other social activities Perform all self care activities independently  Perform IADL's (shopping, preparing meals, housekeeping, managing finances) independently Call provider office for new concerns or questions  Work with the social worker to address care coordination needs and will continue to work with the clinical team to address health care and disease management related needs call the Suicide and Crisis Lifeline: 988 call the [place] National Suicide Prevention Lifeline: 217-173-0861 or TTY: 8128338727 TTY 727 605 1696) to talk to a trained counselor call 1-800-273-TALK (toll free, 24 hour hotline) if experiencing a Mental Health or [place]  check blood pressure 3 times per week choose a place to take my blood pressure (home, clinic or office, retail store) write blood pressure results in a log or diary learn about high blood pressure keep a blood pressure log take blood pressure log to all doctor appointments call doctor for signs and symptoms of high blood pressure develop an action plan for high blood pressure keep all doctor appointments take medications for blood pressure exactly as prescribed report new symptoms to your doctor eat more whole grains, fruits and vegetables, lean meats and healthy fats limit salt intake to 2 grams/day - call for medicine refill  2 or 3 days before it runs out - take all medications exactly as prescribed - call doctor with any symptoms you believe are related to your medicine - call doctor when you experience any new symptoms - go to all doctor appointments as scheduled - adhere to prescribed diet: heart healthy diet - develop an exercise routine       Plan:Telephone follow up appointment with care management team member scheduled for:  11-21-2021 at 1 pm  [place], MSN, [place] [facility] Mobile: [phone]

## 2021-09-08 NOTE — Patient Instructions (Signed)
Visit Information  Thank you for taking time to visit with me today. Please don't hesitate to contact me if I can be of assistance to you before our next scheduled telephone appointment.  Following are the goals we discussed today:  Caregiver stress/resources for respite care  (Status: Goal on Track (progressing): YES.) Long Term Goal  Evaluation of current treatment plan related to caregiver stress/resources for respite care, caregiver stress related to the care of 68 year old sister with intellectual disabilities self-management and patient's adherence to plan as established by provider. 09-08-2021: The patient is thankful for the support of the CCM team. She has received the information from the care guides and RNCM and this has been helpful. She also ask about [place] services in the area. Gave the information to the patient about [facility] at [phone]. She is wanting to see if there are [facility] resources in the area. Education and support given.  Discussed plans with patient for ongoing care management follow up and provided patient with direct contact information for care management team Advised patient to call the office for changes in mood, anxiety, questions, or concerns related to care giver stress or needed resources; Provided education to patient re: [facility] services and resources in [place] for respite services. The patients 66 year old mother cares for her sister Manuela Schwartz who is 61 and intellectually challenged. The patient assist with care also. Wants to know about resources to help with respite services available. 09-08-2021: Review of resources and the patient is thankful for the available resources. The patient states she has noticed her sister is more hyper than usual and this is concerning to her. She says she wants to explore other options as the ones currently they have tried are not working. Education and support given.  Collaborated with pcp and LCSW regarding respite services and  caregiver stress. The pcp is active in the care of the patient and patients sister. The patient agrees to talk with the LCSW as well; Provided patient with [facility] booklet by email and phone numbers with website educational materials related to resources in [facility] for respite care and services. Also a careguide referral placed for any additional resources that may be available for respite services.; Reviewed scheduled/upcoming provider appointments including saw pcp on 08-02-2021 and knows to call for changes. The patient will have AWV in July; Social Work referral for assistance with caregiver stress and support for the patient. Has upcoming appointment on 08-09-2021 at 115 pm with LCSW; Discussed plans with patient for ongoing care management follow up and provided patient with direct contact information for care management team; Advised patient to discuss new concerns or questions with provider. Also provided the patient with the RNCM direct number to call for assistance, questions, or concerns; Screening for signs and symptoms of depression related to chronic disease state;  Assessed social determinant of health barriers;    Hyperlipidemia:  (Status: Goal on Track (progressing): YES.) Long Term Goal       Lab Results  Component Value Date    CHOL 229 (H) 12/08/2020    HDL 62 12/08/2020    LDLCALC 145 (H) 12/08/2020    TRIG 126 12/08/2020    CHOLHDL 3.7 12/08/2020      Medication review performed; medication list updated in electronic medical record.  Provider established cholesterol goals reviewed; Counseled on importance of regular laboratory monitoring as prescribed. 09-08-2021: The patient has regular lab work ; Provided HLD Scientist, clinical (histocompatibility and immunogenetics); Reviewed importance of limiting foods high  in cholesterol. 09-08-2021: The patient is compliant with a heart healthy diet.   Hypertension: (Status: Goal on Track (progressing): YES.) Long Term Goal  Last practice recorded BP readings:      BP Readings from Last 3 Encounters:  08/25/21 132/80  08/02/21 124/68  03/14/21 130/78  Most recent eGFR/CrCl:       Lab Results  Component Value Date    EGFR 97 12/08/2020    No components found for: CRCL Self reported blood pressure from 08-03-2021: 124/68 Evaluation of current treatment plan related to hypertension self management and patient's adherence to plan as established by provider. 08-04-2021: The patient established care with pcp this week. The patient has been dealing with HTN since her early 20's. She takes her blood pressures on a consistent basis and records. She is receptive to CCM calls and assistance with chronic conditions. 09-08-2021: The patient is working with the HTN clinic and cardiologist on helping with management of her blood pressures. Blood pressures are trending down and the patient is happy about this. The patient is taking at home. Has not seen an improvement in the hoarseness but has in other areas. Over all she is pleases with the small changes. Is mindful of her blood pressures and proactive in her care.;   Provided education to patient re: stroke prevention, s/s of heart attack and stroke; Reviewed prescribed diet heart healthy diet- sent healthy eating and low sodium brochure by email to the patient Reviewed medications with patient and discussed importance of compliance. 09-08-2021: The patient is doing well and has had medication changes. Is compliant with her medications.;  Discussed plans with patient for ongoing care management follow up and provided patient with direct contact information for care management team; Advised patient, providing education and rationale, to monitor blood pressure daily and record, calling PCP for findings outside established parameters;  Advised patient to discuss changes in HTN and heart healthy with provider; Provided education on prescribed diet Heart healthy diet, will also send Emmi information and mychart information on  HTN health and well being;  Discussed complications of poorly controlled blood pressure such as heart disease, stroke, circulatory complications, vision complications, kidney impairment, sexual dysfunction;     Our next appointment is by telephone on 11-21-2021 at 1 pm  Please call the care guide team at [phone] if you need to cancel or reschedule your appointment.   If you are experiencing a Mental Health or [place] or need someone to talk to, please call the Suicide and Crisis Lifeline: 988 call the [place] National Suicide Prevention Lifeline: (606)208-1865 or TTY: 272-408-8413 TTY (334) 366-1998) to talk to a trained counselor call 1-800-273-TALK (toll free, 24 hour hotline)   Patient verbalizes understanding of instructions and care plan provided today and agrees to view in [place]. Active MyChart status and patient understanding of how to access instructions and care plan via MyChart confirmed with patient.     Noreene Larsson RN, MSN, [place] Mobile: [phone]

## 2021-09-12 ENCOUNTER — Ambulatory Visit: Payer: PPO | Admitting: Nurse Practitioner

## 2021-09-15 DIAGNOSIS — J301 Allergic rhinitis due to pollen: Secondary | ICD-10-CM | POA: Diagnosis not present

## 2021-09-15 DIAGNOSIS — J3089 Other allergic rhinitis: Secondary | ICD-10-CM | POA: Diagnosis not present

## 2021-09-15 DIAGNOSIS — J3081 Allergic rhinitis due to animal (cat) (dog) hair and dander: Secondary | ICD-10-CM | POA: Diagnosis not present

## 2021-09-16 LAB — ALDOSTERONE + RENIN ACTIVITY W/ RATIO
ALDO / PRA Ratio: 4.4 (ref 0.0–30.0)
Aldosterone: 10.3 ng/dL (ref 0.0–30.0)
PRA LC/MS/MS: 2.326 ng/mL/hr (ref 0.167–5.380)

## 2021-09-19 ENCOUNTER — Telehealth (HOSPITAL_BASED_OUTPATIENT_CLINIC_OR_DEPARTMENT_OTHER): Payer: Self-pay

## 2021-09-19 ENCOUNTER — Other Ambulatory Visit
Admission: RE | Admit: 2021-09-19 | Discharge: 2021-09-19 | Disposition: A | Discharge status: Home / Self Care | Payer: PPO | Attending: Family | Admitting: Family

## 2021-09-19 DIAGNOSIS — I10 Essential (primary) hypertension: Secondary | ICD-10-CM | POA: Insufficient documentation

## 2021-09-19 DIAGNOSIS — E876 Hypokalemia: Secondary | ICD-10-CM

## 2021-09-19 LAB — BASIC METABOLIC PANEL
Anion gap: 9 (ref 5–15)
BUN: 16 mg/dL (ref 8–23)
CO2: 29 mmol/L (ref 22–32)
Calcium: 9.4 mg/dL (ref 8.9–10.3)
Chloride: 103 mmol/L (ref 98–111)
Creatinine, Ser: 0.66 mg/dL (ref 0.44–1.00)
GFR, Estimated: >60 mL/min (ref >60)
Glucose, Bld: 100 mg/dL — ABNORMAL HIGH (ref 70–99)
Potassium: 3.1 mmol/L — ABNORMAL LOW (ref 3.5–5.1)
Sodium: 141 mmol/L (ref 135–145)

## 2021-09-19 MED ORDER — POTASSIUM CHLORIDE CRYS ER 20 MEQ PO TBCR: 20.0000 meq | EXTENDED_RELEASE_TABLET | Freq: Every day | ORAL | 3 refills | Status: DC

## 2021-09-19 NOTE — Telephone Encounter (Addendum)
Results called to patient who verbalizes understanding! Labs ordered and mailed, prescriptions updated and sent to pharmacy on file     ----- Message from Loel Dubonnet, NP sent at 09/19/2021 12:21 PM EDT ----- Potassium remains low.  Recommend potassium 40 mEq daily for 3 days then 20 mEq daily. Repeat BMP, magnesium in one week.

## 2021-09-23 ENCOUNTER — Telehealth: Payer: Self-pay | Admitting: Licensed Clinical Social Worker

## 2021-09-23 NOTE — Telephone Encounter (Signed)
    Clinical Social Work  Care Management   Phone Outreach    09/23/2021 Name: Stacie Ellis MRN: [id number] DOB: 1955/03/09  Stacie Ellis is a 66 y.o. year old female who is a primary care patient of Olin Hauser, DO .   Reason for referral: [employer]  and Caregiver Stress.    CCM LCSW reached out to patient today by phone to introduce self, assess needs and offer Care Management services and interventions.    Telephone outreach was unsuccessful. A HIPPA compliant phone message was left for the patient providing contact information and requesting a return call.   Plan:CCM LCSW will wait for return call. If no return call is received, Will route chart to Care Guide to see if patient would like to reschedule phone appointment   Review of patient status, including review of consultants reports, relevant laboratory and other test results, and collaboration with appropriate care team members and the patient's provider was performed as part of comprehensive patient evaluation and provision of care management services.    Christa See, MSW, [place] [facility] Care Management [email or web address] Phone [phone] 2:15 PM

## 2021-09-27 ENCOUNTER — Ambulatory Visit: Payer: PPO

## 2021-10-03 ENCOUNTER — Other Ambulatory Visit
Admission: RE | Admit: 2021-10-03 | Discharge: 2021-10-03 | Disposition: A | Discharge status: Home / Self Care | Payer: PPO | Attending: Cardiovascular Disease | Admitting: Cardiovascular Disease

## 2021-10-03 DIAGNOSIS — I10 Essential (primary) hypertension: Secondary | ICD-10-CM | POA: Diagnosis not present

## 2021-10-03 DIAGNOSIS — E782 Mixed hyperlipidemia: Secondary | ICD-10-CM | POA: Diagnosis not present

## 2021-10-03 DIAGNOSIS — E876 Hypokalemia: Secondary | ICD-10-CM | POA: Diagnosis not present

## 2021-10-03 LAB — BASIC METABOLIC PANEL
Anion gap: 11 (ref 5–15)
BUN: 15 mg/dL (ref 8–23)
CO2: 27 mmol/L (ref 22–32)
Calcium: 9.3 mg/dL (ref 8.9–10.3)
Chloride: 103 mmol/L (ref 98–111)
Creatinine, Ser: 0.63 mg/dL (ref 0.44–1.00)
GFR, Estimated: >60 mL/min (ref >60)
Glucose, Bld: 98 mg/dL (ref 70–99)
Potassium: 3.3 mmol/L — ABNORMAL LOW (ref 3.5–5.1)
Sodium: 141 mmol/L (ref 135–145)

## 2021-10-03 LAB — MAGNESIUM: Magnesium: 2 mg/dL (ref 1.7–2.4)

## 2021-10-04 ENCOUNTER — Encounter: Payer: Self-pay | Admitting: Family Medicine

## 2021-10-04 DIAGNOSIS — J301 Allergic rhinitis due to pollen: Secondary | ICD-10-CM | POA: Diagnosis not present

## 2021-10-04 DIAGNOSIS — R7303 Prediabetes: Secondary | ICD-10-CM

## 2021-10-04 DIAGNOSIS — K219 Gastro-esophageal reflux disease without esophagitis: Secondary | ICD-10-CM

## 2021-10-04 DIAGNOSIS — J3089 Other allergic rhinitis: Secondary | ICD-10-CM | POA: Diagnosis not present

## 2021-10-04 DIAGNOSIS — R300 Dysuria: Secondary | ICD-10-CM

## 2021-10-04 DIAGNOSIS — E538 Deficiency of other specified B group vitamins: Secondary | ICD-10-CM

## 2021-10-04 DIAGNOSIS — Z9049 Acquired absence of other specified parts of digestive tract: Secondary | ICD-10-CM

## 2021-10-04 DIAGNOSIS — J4520 Mild intermittent asthma, uncomplicated: Secondary | ICD-10-CM

## 2021-10-04 DIAGNOSIS — J3081 Allergic rhinitis due to animal (cat) (dog) hair and dander: Secondary | ICD-10-CM | POA: Diagnosis not present

## 2021-10-04 DIAGNOSIS — I10 Essential (primary) hypertension: Secondary | ICD-10-CM

## 2021-10-04 DIAGNOSIS — R5383 Other fatigue: Secondary | ICD-10-CM

## 2021-10-04 DIAGNOSIS — E782 Mixed hyperlipidemia: Secondary | ICD-10-CM

## 2021-10-04 DIAGNOSIS — Z0001 Encounter for general adult medical examination with abnormal findings: Secondary | ICD-10-CM

## 2021-10-04 DIAGNOSIS — E559 Vitamin D deficiency, unspecified: Secondary | ICD-10-CM

## 2021-10-04 MED ORDER — AMLODIPINE BESYLATE 2.5 MG PO TABS: 2.5000 mg | ORAL_TABLET | Freq: Two times a day (BID) | ORAL | 3 refills | Status: DC

## 2021-10-06 ENCOUNTER — Ambulatory Visit (INDEPENDENT_AMBULATORY_CARE_PROVIDER_SITE_OTHER): Payer: PPO

## 2021-10-06 DIAGNOSIS — I10 Essential (primary) hypertension: Secondary | ICD-10-CM

## 2021-10-07 ENCOUNTER — Encounter (HOSPITAL_BASED_OUTPATIENT_CLINIC_OR_DEPARTMENT_OTHER): Payer: Self-pay | Admitting: Pharmacist Clinician (PhC)/ Clinical Pharmacy Specialist

## 2021-10-07 ENCOUNTER — Other Ambulatory Visit (HOSPITAL_BASED_OUTPATIENT_CLINIC_OR_DEPARTMENT_OTHER): Payer: Self-pay | Admitting: *Deleted

## 2021-10-07 ENCOUNTER — Ambulatory Visit: Payer: PPO

## 2021-10-07 ENCOUNTER — Ambulatory Visit (HOSPITAL_BASED_OUTPATIENT_CLINIC_OR_DEPARTMENT_OTHER): Payer: PPO | Admitting: Pharmacist Clinician (PhC)/ Clinical Pharmacy Specialist

## 2021-10-07 ENCOUNTER — Encounter (HOSPITAL_BASED_OUTPATIENT_CLINIC_OR_DEPARTMENT_OTHER): Payer: Self-pay | Admitting: *Deleted

## 2021-10-07 DIAGNOSIS — I10 Essential (primary) hypertension: Secondary | ICD-10-CM | POA: Diagnosis not present

## 2021-10-07 DIAGNOSIS — E876 Hypokalemia: Secondary | ICD-10-CM

## 2021-10-07 NOTE — Patient Instructions (Signed)
Return for a a follow up appointment November 14 at 11 am at [facility] office  Check your blood pressure at home daily and keep record of the readings.  Take your BP meds as follows:  Continue with your current medications  Bring all of your meds, your BP cuff and your record of home blood pressures to your next appointment.  Exercise as you're able, try to walk approximately 30 minutes per day.  Keep salt intake to a minimum, especially watch canned and prepared boxed foods.  Eat more fresh fruits and vegetables and fewer canned items.  Avoid eating in fast food restaurants.    HOW TO TAKE YOUR BLOOD PRESSURE: Rest 5 minutes before taking your blood pressure.  Don't smoke or drink caffeinated beverages for at least 30 minutes before. Take your blood pressure before (not after) you eat. Sit comfortably with your back supported and both feet on the floor (don't cross your legs). Elevate your arm to heart level on a table or a desk. Use the proper sized cuff. It should fit smoothly and snugly around your bare upper arm. There should be enough room to slip a fingertip under the cuff. The bottom edge of the cuff should be 1 inch above the crease of the elbow. Ideally, take 3 measurements at one sitting and record the average.

## 2021-10-07 NOTE — Progress Notes (Signed)
10/07/2021 Stacie Ellis 1955/11/05 [id number]   HPI:  Stacie Ellis is a 66 y.o. female patient of Dr Oval Linsey, with a [place] below who presents today for hypertension clinic evaluation.  She was seen by Laurann Montana in the advanced hypertension clinic at the request of her PCP Dr. Parks Ranger.  At the visit with [facility] her pressure was 132/80.  Patient was reporting problems with hoarseness that she was told could be because of telmisartan.  Caitlin d/c'd this and gave her chlorthalidone 25 mg daily instead.  Patient was also scheduled for sleep study and renal artery dopplers.  She was found to have mild OSA but renal artery dopplers were negative.  Patient reports developing hypertension around the age of 68, similar to her father and brother.    Today she returns for follow up.  Has done well with the chlorthalidone and her home blood pressure readings verify this.    Past Medical History: hyperlipidemia 10/22 LDL 145  Pre-diabetes A1c in 2019 at 5.9  OSA Not yet started CPAP    Blood Pressure Goal:  130/80  Current Medications: chlorthalidone 25 mg, amlodipine 2.5 mg bid, carvedilol 25 mg bid  Family Hx: father and brother both with hypertension diagnosed in their 42's; father had multiple MI, first at 75, died at 35 after stroke; brother doing well on meds; mother 109 on no meds; daughter with heart issues, not sure what problem is, tachycardia  Social Hx: no tobacco, no alchohol, only occasional caffeine  Diet: joined [employer] online - has lost 10 lbs since beginning of year, eats out regularly, but not adding salt; occasional snacks, but watches everything closely  Exercise: walks at times  Home BP readings:   10 am readings average 126/76 HR 75  7 pm readings average 131/81 HR 84  Intolerances:  propranolol - nightmares, atenolol/metoprolol - not effective, lisinopril - cough, amlodipine 10 mg - edema; telmisartan - hoarseness?, hydralazine -  palpitations  Labs:  09/19/21:  Na 141, K 3.1, Glu 100, BUN 16, SCr 0.66 GFR > 60  10/03/21: Na 141, K 3.3, Glu 98, BUN 15, SCr 0.63, GFR > 60, Mg 2.0   Wt Readings from Last 3 Encounters:  10/07/21 150 lb 2.1 oz (68.1 kg)  08/25/21 155 lb (70.3 kg)  08/02/21 153 lb 12.8 oz (69.8 kg)   BP Readings from Last 3 Encounters:  10/07/21 (!) 156/88  08/25/21 132/80  08/02/21 124/68   Pulse Readings from Last 3 Encounters:  08/25/21 81  08/02/21 84  03/14/21 90    Current Outpatient Medications  Medication Sig Dispense Refill   albuterol (PROVENTIL HFA;VENTOLIN HFA) 108 (90 Base) MCG/ACT inhaler Inhale 2 puffs into the lungs every 6 (six) hours as needed for wheezing or shortness of breath.     amLODipine (NORVASC) 2.5 MG tablet Take 1 tablet (2.5 mg total) by mouth 2 (two) times daily. 180 tablet 3   aspirin 81 MG tablet Take 81 mg by mouth daily.     budesonide-formoterol (SYMBICORT) 80-4.5 MCG/ACT inhaler Inhale 2 puffs into the lungs 2 (two) times daily.     carvedilol (COREG) 25 MG tablet TAKE 1 TABLET BY MOUTH TWICE DAILY WITH MEAL 180 tablet 1   chlorthalidone (HYGROTON) 25 MG tablet Take 1 tablet (25 mg total) by mouth daily. 90 tablet 3   Cholecalciferol (VITAMIN D3) 50 MCG (2000 UT) capsule Take 2 capsules (4,000 Units total) by mouth daily.     loratadine (CLARITIN) 10  MG tablet Take 10 mg by mouth daily.     Magnesium Glycinate 100 MG CAPS Take 200 mg by mouth daily.     montelukast (SINGULAIR) 10 MG tablet TAKE 1 TABLET(10 MG) BY MOUTH AT BEDTIME 30 tablet 5   pantoprazole (PROTONIX) 40 MG tablet TAKE 1 TABLET BY MOUTH DAILY 90 tablet 1   potassium chloride (KLOR-CON M) 20 MEQ tablet Take 1 tablet (20 mEq total) by mouth daily. 90 tablet 3   No current facility-administered medications for this visit.    Allergies  Allergen Reactions   Levaquin [Levofloxacin] Other (See Comments)    Severe tightness in left leg   Penicillins Rash    Past Medical History:  Diagnosis  Date   Asthma    Breast cancer ([place]) 02/2010   GERD (gastroesophageal reflux disease) 2008   Hypertension    Malignant neoplasm of upper-outer quadrant of female breast ([place]) 2011   Intermediate grade DCIS, ER: 50%; PR 10%. left breast lumpectomy, wide excision and a repeat wide excision on 03/10/2010 for multiple positive margins on original resection   Malignant neoplasm of upper-outer quadrant of female breast [facility]) January 2012:    Re-excision to negative margins.    Obesity, unspecified    Postmenopausal bleeding 2013   S/P radiation therapy 2012   whole breast,    Special screening for malignant neoplasms, colon     Blood pressure (!) 156/88, weight 150 lb 2.1 oz (68.1 kg).  Essential hypertension Patient with essential hypertension, now doing well on chlorthalidone, amlodipine and carvedilol.  Will have her continue with these as well as regular home monitoring through [place].  She will return to see Dr. Oval Linsey in 2 months for completion of the study.  We can continue to work with her after that as needed.     Tommy Medal PharmD CPP [facility] [address] [place], [place] [address] [phone]

## 2021-10-07 NOTE — Assessment & Plan Note (Signed)
Patient with essential hypertension, now doing well on chlorthalidone, amlodipine and carvedilol.  Will have her continue with these as well as regular home monitoring through [place].  She will return to see Dr. Oval Linsey in 2 months for completion of the study.  We can continue to work with her after that as needed.

## 2021-10-11 DIAGNOSIS — J3089 Other allergic rhinitis: Secondary | ICD-10-CM | POA: Diagnosis not present

## 2021-10-11 DIAGNOSIS — J3081 Allergic rhinitis due to animal (cat) (dog) hair and dander: Secondary | ICD-10-CM | POA: Diagnosis not present

## 2021-10-11 DIAGNOSIS — J301 Allergic rhinitis due to pollen: Secondary | ICD-10-CM | POA: Diagnosis not present

## 2021-10-13 MED ORDER — POTASSIUM CHLORIDE CRYS ER 20 MEQ PO TBCR: 40.0000 meq | EXTENDED_RELEASE_TABLET | Freq: Every day | ORAL | 3 refills | Status: DC

## 2021-10-18 DIAGNOSIS — J3081 Allergic rhinitis due to animal (cat) (dog) hair and dander: Secondary | ICD-10-CM | POA: Diagnosis not present

## 2021-10-18 DIAGNOSIS — J301 Allergic rhinitis due to pollen: Secondary | ICD-10-CM | POA: Diagnosis not present

## 2021-10-18 DIAGNOSIS — J3089 Other allergic rhinitis: Secondary | ICD-10-CM | POA: Diagnosis not present

## 2021-10-20 ENCOUNTER — Encounter: Payer: Self-pay | Admitting: Family Medicine

## 2021-10-20 ENCOUNTER — Ambulatory Visit (INDEPENDENT_AMBULATORY_CARE_PROVIDER_SITE_OTHER): Payer: PPO | Admitting: Licensed Clinical Social Worker

## 2021-10-20 DIAGNOSIS — I10 Essential (primary) hypertension: Secondary | ICD-10-CM

## 2021-10-20 DIAGNOSIS — Z636 Dependent relative needing care at home: Secondary | ICD-10-CM

## 2021-10-25 DIAGNOSIS — J301 Allergic rhinitis due to pollen: Secondary | ICD-10-CM | POA: Diagnosis not present

## 2021-10-25 DIAGNOSIS — J3089 Other allergic rhinitis: Secondary | ICD-10-CM | POA: Diagnosis not present

## 2021-10-25 DIAGNOSIS — J3081 Allergic rhinitis due to animal (cat) (dog) hair and dander: Secondary | ICD-10-CM | POA: Diagnosis not present

## 2021-10-26 DIAGNOSIS — J301 Allergic rhinitis due to pollen: Secondary | ICD-10-CM | POA: Diagnosis not present

## 2021-10-31 ENCOUNTER — Other Ambulatory Visit
Admission: RE | Admit: 2021-10-31 | Discharge: 2021-10-31 | Disposition: A | Discharge status: Home / Self Care | Payer: PPO | Attending: Cardiovascular Disease | Admitting: Cardiovascular Disease

## 2021-10-31 ENCOUNTER — Telehealth (HOSPITAL_BASED_OUTPATIENT_CLINIC_OR_DEPARTMENT_OTHER): Payer: Self-pay

## 2021-10-31 DIAGNOSIS — I10 Essential (primary) hypertension: Secondary | ICD-10-CM

## 2021-10-31 DIAGNOSIS — E876 Hypokalemia: Secondary | ICD-10-CM | POA: Diagnosis not present

## 2021-10-31 LAB — BASIC METABOLIC PANEL
Anion gap: 9 (ref 5–15)
BUN: 11 mg/dL (ref 8–23)
CO2: 29 mmol/L (ref 22–32)
Calcium: 9.2 mg/dL (ref 8.9–10.3)
Chloride: 101 mmol/L (ref 98–111)
Creatinine, Ser: 0.72 mg/dL (ref 0.44–1.00)
GFR, Estimated: >60 mL/min (ref >60)
Glucose, Bld: 99 mg/dL (ref 70–99)
Potassium: 3.2 mmol/L — ABNORMAL LOW (ref 3.5–5.1)
Sodium: 139 mmol/L (ref 135–145)

## 2021-10-31 MED ORDER — CHLORTHALIDONE 25 MG PO TABS: 12.5000 mg | ORAL_TABLET | Freq: Every day | ORAL | 3 refills | Status: DC

## 2021-10-31 NOTE — Telephone Encounter (Addendum)
Seen by patient Stacie Ellis on 10/31/2021  1:07 PM; follow up mychart message sent to patient; meds updated, labs ordered and mailed to patient.    ----- Message from Loel Dubonnet, NP sent at 10/31/2021 12:53 PM EDT ----- Potassium remains low.   Recommend reduce Chlorthalidone to half tablet daily.  Increase potassium to 42mq twice daily for 2 days then return to 447m daily.  BMP, magnesium level in 1 week for monitoring.

## 2021-10-31 NOTE — Addendum Note (Signed)
Addended by: Joycie Peek on: 10/31/2021 08:52 AM   Modules accepted: Orders

## 2021-11-01 DIAGNOSIS — J301 Allergic rhinitis due to pollen: Secondary | ICD-10-CM | POA: Diagnosis not present

## 2021-11-01 DIAGNOSIS — J3089 Other allergic rhinitis: Secondary | ICD-10-CM | POA: Diagnosis not present

## 2021-11-01 DIAGNOSIS — J3081 Allergic rhinitis due to animal (cat) (dog) hair and dander: Secondary | ICD-10-CM | POA: Diagnosis not present

## 2021-11-03 DIAGNOSIS — I10 Essential (primary) hypertension: Secondary | ICD-10-CM

## 2021-11-03 DIAGNOSIS — Z6829 Body mass index (BMI) 29.0-29.9, adult: Secondary | ICD-10-CM | POA: Diagnosis not present

## 2021-11-03 DIAGNOSIS — L309 Dermatitis, unspecified: Secondary | ICD-10-CM | POA: Diagnosis not present

## 2021-11-09 NOTE — Patient Instructions (Signed)
Visit Information  Thank you for taking time to visit with me today. Please don't hesitate to contact me if I can be of assistance to you before our next scheduled telephone appointment.  Following are the goals we discussed today:  Attend PCP appts Utilize healthy coping skills and supportive resources Contact PCP with any questions or concerns  Please call the care guide team at [phone] if you need to cancel or reschedule your appointment.   If you are experiencing a Mental Health or [place] or need someone to talk to, please call the Suicide and Crisis Lifeline: 988 call 911   Following is a copy of your full plan of care:  Care Plan : LCSW Plan of Care  Updates made by Rebekah Chesterfield, LCSW since 11/09/2021 12:00 AM     Problem: Caregiver Stress      Goal: Caregiver Coping Optimized Completed 10/20/2021  Start Date: 10/20/2021  This Visit's Progress: On track  Note:   Current Barriers:  Caregiver Stress  CSW Clinical Goal(s):  Patient  will demonstrate a reduction in symptoms related to :Caregiver Stress  through collaboration with [place] representative, provider, and care team.   Interventions: 1:1 collaboration with primary care provider regarding development and update of comprehensive plan of care as evidenced by provider attestation and co-signature Inter-disciplinary care team collaboration (Ellis longitudinal plan of care) Evaluation of current treatment plan related to  self management and patient's adherence to plan as established by provider Pt endorses caregiver fatigue due to caring for elderly mother and sister, with mental handicap Pt shared sister's behavior has changed noting impulsivity and disruptive patterns. Concerned that medications aren't effective. Pt agreed to contact PCP through MyChart for suggestions and/or referral  Care Coordination Interventions: Mindfulness or Relaxation training provided Active listening / Reflection  utilized  Emotional Support Provided Caregiver stress acknowledged    Task & activities to accomplish goals: Attend provider appts Utilize healthy coping skills and/or supportive resources Contact PCP office with any questions or concerns          Stacie Ellis was given information about Care Management services by the embedded care coordination team including:  Care Management services include personalized support from designated clinical staff supervised by her physician, including individualized plan of care and coordination with other care providers 24/7 contact phone numbers for assistance for urgent and routine care needs. The patient may stop CCM services at any time (effective at the end of the month) by phone call to the office staff.  Patient agreed to services and verbal consent obtained.   Patient verbalizes understanding of instructions and care plan provided today and agrees to view in [place]. Active MyChart status and patient understanding of how to access instructions and care plan via MyChart confirmed with patient.     Stacie Ellis, MSW, [email or web address] Phone [phone] 1:57 AM

## 2021-11-09 NOTE — Chronic Care Management (AMB) (Signed)
Chronic Care Management    Clinical Social Work Note  11/09/2021 Name: Stacie Ellis MRN: [id number] DOB: [birth year]  Stacie Ellis is a 66 y.o. year old female who is a primary care patient of Olin Hauser, DO. The CCM team was consulted to assist the patient with chronic disease management and/or care coordination needs related to: Caregiver Stress.   Engaged with patient by telephone for initial visit in response to provider referral for social work chronic care management and care coordination services.   Consent to Services:  The patient was given the following information about Chronic Care Management services today, agreed to services, and gave verbal consent: 1. CCM service includes personalized support from designated clinical staff supervised by the primary care provider, including individualized plan of care and coordination with other care providers 2. 24/7 contact phone numbers for assistance for urgent and routine care needs. 3. Service will only be billed when office clinical staff spend 20 minutes or more in a month to coordinate care. 4. Only one practitioner may furnish and bill the service in a calendar month. 5.The patient may stop CCM services at any time (effective at the end of the month) by phone call to the office staff. 6. The patient will be responsible for cost sharing (co-pay) of up to 20% of the service fee (after annual deductible is met). Patient agreed to services and consent obtained.  Patient agreed to services and consent obtained.   Assessment: Review of patient past medical history, allergies, medications, and health status, including review of relevant consultants reports was performed today as part of a comprehensive evaluation and provision of chronic care management and care coordination services.  Pt endorses caregiver fatigue due to caring for elderly mother and sister, with mental handicap Pt shared sister's behavior has changed  noting impulsivity and disruptive patterns. Concerned that medications aren't effective. Pt agreed to contact PCP through MyChart for suggestions and/or referral   SDOH (Social Determinants of Health) assessments and interventions performed:  SDOH Interventions    [place] Office Visit from 08/25/2021 in [facility] Chronic Care Management from 08/04/2021 in [facility]  SDOH Interventions    Food Insecurity Interventions -- Intervention Not Indicated  Housing Interventions Intervention Not Indicated Intervention Not Indicated  Transportation Interventions Intervention Not Indicated Intervention Not Indicated  Financial Strain Interventions Intervention Not Indicated Intervention Not Indicated  Stress Interventions -- Intervention Not Indicated  Social Connections Interventions Intervention Not Indicated Intervention Not Indicated        Advanced Directives Status: Not addressed in this encounter.  CCM Care Plan  Allergies  Allergen Reactions   Levaquin [Levofloxacin] Other (See Comments)    Severe tightness in left leg   Penicillins Rash    Outpatient Encounter Medications as of 10/20/2021  Medication Sig   albuterol (PROVENTIL HFA;VENTOLIN HFA) 108 (90 Base) MCG/ACT inhaler Inhale 2 puffs into the lungs every 6 (six) hours as needed for wheezing or shortness of breath.   amLODipine (NORVASC) 2.5 MG tablet Take 1 tablet (2.5 mg total) by mouth 2 (two) times daily.   aspirin 81 MG tablet Take 81 mg by mouth daily.   budesonide-formoterol (SYMBICORT) 80-4.5 MCG/ACT inhaler Inhale 2 puffs into the lungs 2 (two) times daily.   carvedilol (COREG) 25 MG tablet TAKE 1 TABLET BY MOUTH TWICE DAILY WITH MEAL   Cholecalciferol (VITAMIN D3) 50 MCG (2000 UT) capsule Take 2 capsules (4,000 Units total) by mouth daily.   loratadine (CLARITIN) 10  MG tablet Take 10 mg by mouth daily.   Magnesium Glycinate 100 MG CAPS Take 200 mg by mouth daily.    montelukast (SINGULAIR) 10 MG tablet TAKE 1 TABLET(10 MG) BY MOUTH AT BEDTIME   pantoprazole (PROTONIX) 40 MG tablet TAKE 1 TABLET BY MOUTH DAILY   potassium chloride SA (KLOR-CON M) 20 MEQ tablet Take 2 tablets (40 mEq total) by mouth daily.   [DISCONTINUED] chlorthalidone (HYGROTON) 25 MG tablet Take 1 tablet (25 mg total) by mouth daily.   No facility-administered encounter medications on file as of 10/20/2021.    Patient Active Problem List   Diagnosis Date Noted   Caregiver burden 08/03/2021   Localized swelling of both lower legs 09/08/2019   Dysuria 09/08/2019   Personal history of colon cancer    Polyp of descending colon    Gastroesophageal reflux disease without esophagitis 04/07/2019   Mild intermittent asthma without complication 58/85/0277   Pre-diabetes 01/16/2018   Hypomagnesemia 01/16/2018   Essential hypertension 08/05/2017   Impaired fasting glucose 08/05/2017   Mixed hyperlipidemia 08/05/2017   Vitamin D deficiency 08/05/2017   Allergic rhinitis due to pollen 06/27/2017   Ventral hernia without obstruction or gangrene 05/29/2016   History of tachycardia 06/29/2014   Skin lesion of chest wall 03/20/2014   Disorder of skin or subcutaneous tissue 03/20/2014   Personal history of colonic polyps 08/08/2013   Encounter for general adult medical examination with abnormal findings 08/08/2013   History of breast cancer 02/11/2013   Malignant neoplasm of upper-outer quadrant of female breast ([place])     Conditions to be addressed/monitored: Caregiver Stress  There are no care plans that you recently modified to display for this patient.    Follow Up Plan: Pt will continue with CCM services       Christa See, MSW, [email or web address] Phone [phone] 1:50 AM

## 2021-11-10 ENCOUNTER — Telehealth: Payer: PPO

## 2021-11-14 ENCOUNTER — Encounter (HOSPITAL_BASED_OUTPATIENT_CLINIC_OR_DEPARTMENT_OTHER): Payer: Self-pay

## 2021-11-15 DIAGNOSIS — J301 Allergic rhinitis due to pollen: Secondary | ICD-10-CM | POA: Diagnosis not present

## 2021-11-15 DIAGNOSIS — J3081 Allergic rhinitis due to animal (cat) (dog) hair and dander: Secondary | ICD-10-CM | POA: Diagnosis not present

## 2021-11-15 DIAGNOSIS — J3089 Other allergic rhinitis: Secondary | ICD-10-CM | POA: Diagnosis not present

## 2021-11-15 NOTE — Telephone Encounter (Signed)
Please advise 

## 2021-11-21 ENCOUNTER — Ambulatory Visit (INDEPENDENT_AMBULATORY_CARE_PROVIDER_SITE_OTHER): Payer: PPO

## 2021-11-21 DIAGNOSIS — I10 Essential (primary) hypertension: Secondary | ICD-10-CM

## 2021-11-21 DIAGNOSIS — E782 Mixed hyperlipidemia: Secondary | ICD-10-CM

## 2021-11-21 NOTE — Patient Instructions (Signed)
Visit Information  Thank you for taking time to visit with me today. Please don't hesitate to contact me if I can be of assistance to you before our next scheduled telephone appointment.  Following are the goals we discussed today:  Take medications as prescribed   Attend all scheduled provider appointments Call pharmacy for medication refills 3-7 days in advance of running out of medications Attend church or other social activities Perform all self care activities independently  Perform IADL's (shopping, preparing meals, housekeeping, managing finances) independently Call provider office for new concerns or questions  Work with the social worker to address care coordination needs and will continue to work with the clinical team to address health care and disease management related needs call the Suicide and Crisis Lifeline: 988 call the [place] National Suicide Prevention Lifeline: 7027301523 or TTY: 830 173 2078 TTY 706-563-1108) to talk to a trained counselor call 1-800-273-TALK (toll free, 24 hour hotline) if experiencing a Mental Health or [place]  check blood pressure 3 times per week choose a place to take my blood pressure (home, clinic or office, retail store) write blood pressure results in a log or diary learn about high blood pressure keep a blood pressure log take blood pressure log to all doctor appointments call doctor for signs and symptoms of high blood pressure develop an action plan for high blood pressure keep all doctor appointments take medications for blood pressure exactly as prescribed report new symptoms to your doctor eat more whole grains, fruits and vegetables, lean meats and healthy fats limit salt intake to 2 grams/day call for medicine refill 2 or 3 days before it runs out take all medications exactly as prescribed call doctor with any symptoms you believe are related to your medicine call doctor when you experience any new  symptoms go to all doctor appointments as scheduled adhere to prescribed diet: heart healthy diet develop an exercise routine Continue to work with Education officer, museum for resources/ guidance/ support related to being a caregiver and making sound decisions Please take care of yourself, get outside daily and walk, get plenty of sleep and drink adequate fluids  Your next appointment is with social worker on 11/28/21 at 1130 am.  Please call the care guide team at [phone] if you need to cancel or reschedule your appointment.   If you are experiencing a Mental Health or [place] or need someone to talk to, please call the Suicide and Crisis Lifeline: 988 call the [place] National Suicide Prevention Lifeline: 4580680338 or TTY: 603-824-5670 TTY (431)828-8102) to talk to a trained counselor call 1-800-273-TALK (toll free, 24 hour hotline) go to [facility] [address], [place] [phone]) call 911   Patient verbalizes understanding of instructions and care plan provided today and agrees to view in [place]. Active MyChart status and patient understanding of how to access instructions and care plan via MyChart confirmed with patient.     Jacqlyn Larsen RNC, BSN RN Case Manager [place] [phone]

## 2021-11-21 NOTE — Chronic Care Management (AMB) (Signed)
Chronic Care Management   CCM RN Visit Note  11/21/2021 Name: Shunte Senseney MRN: [id number] DOB: 13-Jun-1955  Subjective: Chondra Boyde is a 66 y.o. year old female who is a primary care patient of Olin Hauser, DO. The care management team was consulted for assistance with disease management and care coordination needs.    Engaged with patient by telephone for follow up visit in response to provider referral for case management and/or care coordination services.   Consent to Services:  The patient was given information about Chronic Care Management services, agreed to services, and gave verbal consent prior to initiation of services.  Please see initial visit note for detailed documentation.   Patient agreed to services and verbal consent obtained.   Assessment: Review of patient past medical history, allergies, medications, health status, including review of consultants reports, laboratory and other test data, was performed as part of comprehensive evaluation and provision of chronic care management services.   SDOH (Social Determinants of Health) assessments and interventions performed:  SDOH Interventions    Flowsheet Row Office Visit from 08/25/2021 in [facility] Chronic Care Management from 08/04/2021 in [facility]  SDOH Interventions    Food Insecurity Interventions -- Intervention Not Indicated  Housing Interventions Intervention Not Indicated Intervention Not Indicated  Transportation Interventions Intervention Not Indicated Intervention Not Indicated  Financial Strain Interventions Intervention Not Indicated Intervention Not Indicated  Stress Interventions -- Intervention Not Indicated  Social Connections Interventions Intervention Not Indicated Intervention Not Indicated        CCM Care Plan  Allergies  Allergen Reactions   Levaquin [Levofloxacin] Other (See Comments)    Severe tightness in left leg    Penicillins Rash    Outpatient Encounter Medications as of 11/21/2021  Medication Sig   albuterol (PROVENTIL HFA;VENTOLIN HFA) 108 (90 Base) MCG/ACT inhaler Inhale 2 puffs into the lungs every 6 (six) hours as needed for wheezing or shortness of breath.   amLODipine (NORVASC) 2.5 MG tablet Take 1 tablet (2.5 mg total) by mouth 2 (two) times daily.   aspirin 81 MG tablet Take 81 mg by mouth daily.   budesonide-formoterol (SYMBICORT) 80-4.5 MCG/ACT inhaler Inhale 2 puffs into the lungs 2 (two) times daily.   carvedilol (COREG) 25 MG tablet TAKE 1 TABLET BY MOUTH TWICE DAILY WITH MEAL   chlorthalidone (HYGROTON) 25 MG tablet Take 0.5 tablets (12.5 mg total) by mouth daily.   Cholecalciferol (VITAMIN D3) 50 MCG (2000 UT) capsule Take 2 capsules (4,000 Units total) by mouth daily.   loratadine (CLARITIN) 10 MG tablet Take 10 mg by mouth daily.   Magnesium Glycinate 100 MG CAPS Take 200 mg by mouth daily.   montelukast (SINGULAIR) 10 MG tablet TAKE 1 TABLET(10 MG) BY MOUTH AT BEDTIME   pantoprazole (PROTONIX) 40 MG tablet TAKE 1 TABLET BY MOUTH DAILY   potassium chloride SA (KLOR-CON M) 20 MEQ tablet Take 2 tablets (40 mEq total) by mouth daily.   No facility-administered encounter medications on file as of 11/21/2021.    Patient Active Problem List   Diagnosis Date Noted   Caregiver burden 08/03/2021   Localized swelling of both lower legs 09/08/2019   Dysuria 09/08/2019   Personal history of colon cancer    Polyp of descending colon    Gastroesophageal reflux disease without esophagitis 04/07/2019   Mild intermittent asthma without complication 55/97/4163   Pre-diabetes 01/16/2018   Hypomagnesemia 01/16/2018   Essential hypertension 08/05/2017   Impaired fasting glucose 08/05/2017  Mixed hyperlipidemia 08/05/2017   Vitamin D deficiency 08/05/2017   Allergic rhinitis due to pollen 06/27/2017   Ventral hernia without obstruction or gangrene 05/29/2016   History of tachycardia  06/29/2014   Skin lesion of chest wall 03/20/2014   Disorder of skin or subcutaneous tissue 03/20/2014   Personal history of colonic polyps 08/08/2013   Encounter for general adult medical examination with abnormal findings 08/08/2013   History of breast cancer 02/11/2013   Malignant neoplasm of upper-outer quadrant of female breast ([place])     Conditions to be addressed/monitored:HTN and HLD  Care Plan : RNCM: General Plan of Care (Adult) for Chronic Disease Management and Care Coordination Needs  Updates made by Kassie Mends, RN since 11/21/2021 12:00 AM     Problem: RNCM: Development of plan of care for Chronic Disease Management (HTN, HLD, Caregiver stress)   Priority: High  Onset Date: 08/04/2021     Long-Range Goal: RNCM: Effective Management  of plan of care for Chronic Disease Management (HTN, HLD, Caregiver stress)   Start Date: 08/04/2021  Expected End Date: 08/05/2022  Recent Progress: On track  Priority: High  Note:   Current Barriers:  Care Coordination needs related to respite care services for intellectually challenged sister (patients mother and patient are caregivers to patients sister)  Chronic Disease Management support and education needs related to HTN and HLD Spoke with patient who reports she is not "worried about blood pressure or other health issues", pt reports she checks blood pressure at home and "readings are good", pt reports her biggest concern is issues with her sister's medications and where her sister will be seen for care, pt reports she is working with LCSW for resources/ guidance.   RNCM Clinical Goal(s):  Patient will verbalize understanding of plan for management of HTN, HLD, and Caregiver stress as evidenced by routine follow up with the pcp and specialist as needed, following plan of care, following dietary restrictions, and working with the CCM team to effectively manage health and well being.  take all medications exactly as prescribed and will  call provider for medication related questions as evidenced by taking medications as ordered and not missing doses, calling for refills before running out of medications.    attend all scheduled medical appointments: with pcp and specialist  as evidenced by keeping appointments and calling for schedule change needs        demonstrate improved and ongoing adherence to prescribed treatment plan for HTN, HLD, and care give stress as evidenced by stable VS, no exacerbations of chronic conditions, and having resources available for respite care and services as needed for patients sister work with Education officer, museum to address caregiver stress and respite services related to the management of HTN, HLD, and care giver stress as evidenced by review of EMR and patient or social worker report     demonstrate ongoing self health care management ability for effective management of chronic conditions as evidenced by  working with the CCM team through collaboration with Consulting civil engineer, provider, and care team.   Interventions: 1:1 collaboration with primary care provider regarding development and update of comprehensive plan of care as evidenced by provider attestation and co-signature Inter-disciplinary care team collaboration (see longitudinal plan of care) Evaluation of current treatment plan related to  self management and patient's adherence to plan as established by provider   Caregiver stress/resources for respite care  (Status: Goal on Track (progressing): YES.) Long Term Goal  Evaluation of current treatment plan related  to caregiver stress/resources for respite care, caregiver stress related to the care of 2 year old sister with intellectual disabilities self-management and patient's adherence to plan as established by provider. 09-08-2021: The patient is thankful for the support of the CCM team. She has received the information from the care guides and RNCM and this has been helpful. She also ask about [place]  services in the area. Gave the information to the patient about [facility] at [phone]. She is wanting to see if there are [facility] resources in the area. Education and support given.  Discussed plans with patient for ongoing care management follow up and provided patient with direct contact information for care management team Advised patient to call the office for changes in mood, anxiety, questions, or concerns related to care giver stress or needed resources; Provided education to patient re: [facility] services and resources in [place] for respite services. The patients 31 year old mother cares for her sister Manuela Schwartz who is 7 and intellectually challenged. The patient assist with care also. Wants to know about resources to help with respite services available. 09-08-2021: Review of resources and the patient is thankful for the available resources. The patient states she has noticed her sister is more hyper than usual and this is concerning to her. She says she wants to explore other options as the ones currently they have tried are not working. Education and support given.  Collaborated with LCSW to inform of case closure for nursing, pt feels biggest concern at present are decisions to be made for her sister (where her care will be, medications etc) Encouraged pt to continue working with LCSW for resources, guidance with caregiver stress/ support (pt states not interested in caregiver support group) Reviewed scheduled/upcoming scheduled provider appointments  Reinforced with patient to discuss new concerns or questions with provider   Hyperlipidemia:  (Status: Goal on Track (progressing): YES.) Long Term Goal  Lab Results  Component Value Date   CHOL 229 (H) 12/08/2020   HDL 62 12/08/2020   Cantril 145 (H) 12/08/2020   TRIG 126 12/08/2020   CHOLHDL 3.7 12/08/2020  Medication review performed; medication list updated in electronic medical record.  Provider established cholesterol goals  reviewed; Counseled on importance of regular laboratory monitoring as prescribed. The patient has regular lab work ; Reviewed importance of following a heart healthy diet Reinforced importance of limiting foods high in cholesterol.   Hypertension: (Status: Goal on Track (progressing): YES.) Long Term Goal  Last practice recorded BP readings:  BP Readings from Last 3 Encounters:  08/25/21 132/80  08/02/21 124/68  03/14/21 130/78  Most recent eGFR/CrCl:  Lab Results  Component Value Date   EGFR 97 12/08/2020    No components found for: CRCL Evaluation of current treatment plan related to hypertension self management and patient's adherence to plan as established by provider.  The patient has been dealing with HTN since her early 20's. She takes her blood pressures on a consistent basis and records.  The patient is working with the HTN clinic and cardiologist on helping with management of her blood pressures. Blood pressures are trending down and the patient is happy about this. The patient is checking blood pressure at home. Has not seen an improvement in the hoarseness but has in other areas.  Reinforced education to patient re: stroke prevention, s/s of heart attack and stroke; Reviewed medications with patient and discussed importance of compliance.  The patient is doing well and has had medication changes. Is compliant with  her medications.;  Discussed plan of care with patient including will continue to work with LCSW and case closure for RN care manager Advised patient, providing education and rationale, to monitor blood pressure daily and record, calling PCP for findings outside established parameters;  Reinforced with patient to discuss changes in HTN and heart healthy with provider   Patient Goals/Self-Care Activities: Take medications as prescribed   Attend all scheduled provider appointments Call pharmacy for medication refills 3-7 days in advance of running out of  medications Attend church or other social activities Perform all self care activities independently  Perform IADL's (shopping, preparing meals, housekeeping, managing finances) independently Call provider office for new concerns or questions  Work with the social worker to address care coordination needs and will continue to work with the clinical team to address health care and disease management related needs call the Suicide and Crisis Lifeline: 988 call the [place] National Suicide Prevention Lifeline: 716-314-4660 or TTY: 828-275-2232 TTY 9344797458) to talk to a trained counselor call 1-800-273-TALK (toll free, 24 hour hotline) if experiencing a Mental Health or [place]  check blood pressure 3 times per week choose a place to take my blood pressure (home, clinic or office, retail store) write blood pressure results in a log or diary learn about high blood pressure keep a blood pressure log take blood pressure log to all doctor appointments call doctor for signs and symptoms of high blood pressure develop an action plan for high blood pressure keep all doctor appointments take medications for blood pressure exactly as prescribed report new symptoms to your doctor eat more whole grains, fruits and vegetables, lean meats and healthy fats limit salt intake to 2 grams/day - call for medicine refill 2 or 3 days before it runs out - take all medications exactly as prescribed - call doctor with any symptoms you believe are related to your medicine - call doctor when you experience any new symptoms - go to all doctor appointments as scheduled - adhere to prescribed diet: heart healthy diet - develop an exercise routine Continue to work with Education officer, museum for resources/ guidance/ support related to being a caregiver and making sound decisions Please take care of yourself, get outside daily and walk, get plenty of sleep and drink adequate fluids       Plan:Telephone  follow up appointment with care management team member scheduled for:  social worker on 11/28/21 at 10 am,  RN case closure today.  Jacqlyn Larsen RNC, BSN RN Case Manager [facility] [phone]

## 2021-11-22 DIAGNOSIS — I10 Essential (primary) hypertension: Secondary | ICD-10-CM | POA: Diagnosis not present

## 2021-11-22 DIAGNOSIS — J3089 Other allergic rhinitis: Secondary | ICD-10-CM | POA: Diagnosis not present

## 2021-11-22 DIAGNOSIS — J301 Allergic rhinitis due to pollen: Secondary | ICD-10-CM | POA: Diagnosis not present

## 2021-11-22 DIAGNOSIS — J3081 Allergic rhinitis due to animal (cat) (dog) hair and dander: Secondary | ICD-10-CM | POA: Diagnosis not present

## 2021-11-23 ENCOUNTER — Telehealth (HOSPITAL_BASED_OUTPATIENT_CLINIC_OR_DEPARTMENT_OTHER): Payer: Self-pay

## 2021-11-23 LAB — BASIC METABOLIC PANEL
BUN/Creatinine Ratio: 19 (ref 12–28)
BUN: 15 mg/dL (ref 8–27)
CO2: 25 mmol/L (ref 20–29)
Calcium: 9.6 mg/dL (ref 8.7–10.3)
Chloride: 100 mmol/L (ref 96–106)
Creatinine, Ser: 0.77 mg/dL (ref 0.57–1.00)
Glucose: 109 mg/dL — ABNORMAL HIGH (ref 70–99)
Potassium: 3.7 mmol/L (ref 3.5–5.2)
Sodium: 141 mmol/L (ref 134–144)
eGFR: 84 mL/min/{1.73_m2} (ref >59)

## 2021-11-23 LAB — MAGNESIUM: Magnesium: 1.9 mg/dL (ref 1.6–2.3)

## 2021-11-23 NOTE — Telephone Encounter (Addendum)
Results released to mychart and follow up mychart message sent asking for bP log and K+ dose.   ----- Message from Loel Dubonnet, NP sent at 11/23/2021  8:10 AM EDT ----- Normal kidney function, electrolytes. Potassium has returned to normal. Good result!  If she will please provide some recent blood pressure readings and let us know what dose of potassium she is currently taking. This will help [place] address the previous cramping she noted.

## 2021-11-23 NOTE — Telephone Encounter (Signed)
BP log and potassium dosing as requested

## 2021-11-28 ENCOUNTER — Ambulatory Visit: Payer: Self-pay | Admitting: *Deleted

## 2021-11-28 NOTE — Telephone Encounter (Signed)
Message from Luciana Axe sent at 11/28/2021 11:24 AM EDT  Summary: BMP Advice   Pt is calling to ask that she had a BMP completed last week by her cardiologist. She was coming in tomorrow for a fasting BMP. Pt would like to know can Dr. Raliegh Ip review the results from her cardiologist or does she still need to come in?           Call History   Type Contact Phone/Fax User  11/28/2021 11:22 AM EDT Phone (Incoming) Ansel Bong "Diane" (Self) [phone] (Confidential) Luciana Axe

## 2021-11-28 NOTE — Telephone Encounter (Signed)
I have called patient and advised her to re-schedule her lab for 4-6 weeks and follow up at that time.  No repeat needed tomorrow 9/26, it can be re-scheduled  Stacie Ellis, [facility] 11/28/2021, 2:19 PM

## 2021-11-28 NOTE — Patient Outreach (Signed)
  Care Coordination   Initial Visit Note   11/28/2021 Name: Stacie Ellis MRN: [id number] DOB: Jan 21, 1956  Stacie Ellis is a 66 y.o. year old female who sees Olin Hauser, DO for primary care. I spoke with  Ansel Bong by phone today.  What matters to the patients health and wellness today?  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 for medication review for possible changes, phone number provided for follow up Patient agreeable to calling [facility] to follow up Solution-Focused Strategies employed:  Active listening / Reflection utilized          SDOH assessments and interventions completed:  Yes  SDOH Interventions Today    Flowsheet Row Most Recent Value  SDOH Interventions   Food Insecurity Interventions Intervention Not Indicated  Transportation Interventions Intervention Not Indicated        Care Coordination Interventions Activated:  Yes  Care Coordination Interventions:  Yes, provided   Follow up plan: Follow up call scheduled for 12/12/21    Encounter Outcome:  Pt. Visit Completed

## 2021-11-28 NOTE — Telephone Encounter (Signed)
Attempted to return her call.  Left a voicemail to call back that any of the nurses could assist her.

## 2021-11-28 NOTE — Telephone Encounter (Signed)
Summary: BMP Advice   Pt is calling to ask that she had a BMP completed last week by her cardiologist. She was coming in tomorrow for a fasting BMP. Pt would like to know can Dr. Raliegh Ip review the results from her cardiologist or does she still need to come in?         Patient returned call. Patient reports recent BMP collected from cardiologist but she was not fasting. Do you want patient to continue to come in for appt tomorrow to collect labs fasting? Please advise . Patient would like a call back today.

## 2021-11-28 NOTE — Patient Instructions (Signed)
Visit Information  Thank you for taking time to visit with me today. Please don't hesitate to contact me if I can be of assistance to you.   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   If you are experiencing a Mental Health or [place] or need someone to talk to, please call the Suicide and Crisis Lifeline: 988 call 911   Patient verbalizes understanding of instructions and care plan provided today and agrees to view in [place]. Active MyChart status and patient understanding of how to access instructions and care plan via MyChart confirmed with patient.     Telephone follow up appointment with care management team member scheduled for: 12/29/21  Elliot Gurney, [place] Worker  [facility] Care Management [phone]

## 2021-11-29 ENCOUNTER — Other Ambulatory Visit: Payer: PPO

## 2021-11-29 DIAGNOSIS — J301 Allergic rhinitis due to pollen: Secondary | ICD-10-CM | POA: Diagnosis not present

## 2021-11-29 DIAGNOSIS — J3089 Other allergic rhinitis: Secondary | ICD-10-CM | POA: Diagnosis not present

## 2021-11-29 DIAGNOSIS — J3081 Allergic rhinitis due to animal (cat) (dog) hair and dander: Secondary | ICD-10-CM | POA: Diagnosis not present

## 2021-12-03 DIAGNOSIS — I10 Essential (primary) hypertension: Secondary | ICD-10-CM

## 2021-12-03 DIAGNOSIS — E785 Hyperlipidemia, unspecified: Secondary | ICD-10-CM

## 2021-12-06 ENCOUNTER — Ambulatory Visit: Payer: PPO | Admitting: Family Medicine

## 2021-12-06 DIAGNOSIS — J301 Allergic rhinitis due to pollen: Secondary | ICD-10-CM | POA: Diagnosis not present

## 2021-12-06 DIAGNOSIS — J3089 Other allergic rhinitis: Secondary | ICD-10-CM | POA: Diagnosis not present

## 2021-12-08 DIAGNOSIS — Z299 Encounter for prophylactic measures, unspecified: Secondary | ICD-10-CM | POA: Insufficient documentation

## 2021-12-08 DIAGNOSIS — J4530 Mild persistent asthma, uncomplicated: Secondary | ICD-10-CM | POA: Insufficient documentation

## 2021-12-08 DIAGNOSIS — J309 Allergic rhinitis, unspecified: Secondary | ICD-10-CM | POA: Insufficient documentation

## 2021-12-08 DIAGNOSIS — J3081 Allergic rhinitis due to animal (cat) (dog) hair and dander: Secondary | ICD-10-CM | POA: Insufficient documentation

## 2021-12-09 ENCOUNTER — Ambulatory Visit (INDEPENDENT_AMBULATORY_CARE_PROVIDER_SITE_OTHER): Payer: PPO

## 2021-12-09 VITALS — Ht 60.0 in | Wt 150.0 lb

## 2021-12-09 DIAGNOSIS — Z0000 Encounter for general adult medical examination without abnormal findings: Secondary | ICD-10-CM | POA: Diagnosis not present

## 2021-12-09 NOTE — Progress Notes (Signed)
Virtual Visit via Telephone Note  I connected with  Stacie Ellis on 12/09/21 at 11:45 AM EDT by telephone and verified that I am speaking with the correct person using two identifiers.  Location: Patient: home Provider: [facility] Persons participating in the virtual visit: [place]   I discussed the limitations, risks, security and privacy concerns of performing an evaluation and management service by telephone and the availability of in person appointments. The patient expressed understanding and agreed to proceed.  Interactive audio and video telecommunications were attempted between this nurse and patient, however failed, due to patient having technical difficulties OR patient did not have access to video capability.  We continued and completed visit with audio only.  Some vital signs may be absent or patient reported.   Dionisio David, LPN  Subjective:   Stacie Ellis is a 66 y.o. female who presents for Medicare Annual (Subsequent) preventive examination.  Review of Systems     Cardiac Risk Factors include: advanced age (>66mn, >>12women);hypertension     Objective:    There were no vitals filed for this visit. There is no height or weight on file to calculate BMI.     12/09/2021   11:43 AM 05/29/2019   11:52 AM 08/09/2015    2:33 PM 06/21/2015    2:08 PM 08/05/2014    3:14 PM  Advanced Directives  Does Patient Have a Medical Advance Directive? No No No No No  Would patient like information on creating a medical advance directive? No - Patient declined No - Patient declined Yes - Educational materials given  No - patient declined information    Current Medications (verified) Outpatient Encounter Medications as of 12/09/2021  Medication Sig   albuterol (PROVENTIL HFA;VENTOLIN HFA) 108 (90 Base) MCG/ACT inhaler Inhale 2 puffs into the lungs every 6 (six) hours as needed for wheezing or shortness of breath.   amLODipine (NORVASC) 2.5 MG tablet  Take 1 tablet (2.5 mg total) by mouth 2 (two) times daily.   aspirin 81 MG tablet Take 81 mg by mouth daily.   budesonide-formoterol (SYMBICORT) 80-4.5 MCG/ACT inhaler Inhale 2 puffs into the lungs 2 (two) times daily.   carvedilol (COREG) 12.5 MG tablet    carvedilol (COREG) 25 MG tablet TAKE 1 TABLET BY MOUTH TWICE DAILY WITH MEAL   cetirizine (ZYRTEC ALLERGY) 10 MG tablet 1 tablet   chlorthalidone (HYGROTON) 25 MG tablet Take 0.5 tablets (12.5 mg total) by mouth daily.   Cholecalciferol (VITAMIN D3) 50 MCG (2000 UT) capsule Take 2 capsules (4,000 Units total) by mouth daily.   EPINEPHrine 0.3 mg/0.3 mL IJ SOAJ injection See admin instructions.   fluticasone (FLONASE) 50 MCG/ACT nasal spray 1-2 sprays   halobetasol (ULTRAVATE) 0.05 % cream Apply topically daily.   loratadine (CLARITIN) 10 MG tablet Take 10 mg by mouth daily.   Magnesium Glycinate 100 MG CAPS Take 200 mg by mouth daily.   montelukast (SINGULAIR) 10 MG tablet TAKE 1 TABLET(10 MG) BY MOUTH AT BEDTIME   pantoprazole (PROTONIX) 20 MG tablet    pantoprazole (PROTONIX) 40 MG tablet TAKE 1 TABLET BY MOUTH DAILY   potassium chloride SA (KLOR-CON M) 20 MEQ tablet Take 2 tablets (40 mEq total) by mouth daily.   Spacer/Aero-Holding Chambers (AEROCHAMBER MV) inhaler See admin instructions.   [DISCONTINUED] pantoprazole (PROTONIX) 40 MG tablet Take by mouth.   hydrochlorothiazide (HYDRODIURIL) 25 MG tablet Take by mouth. (Patient not taking: Reported on 12/09/2021)   lisinopril (ZESTRIL) 10 MG tablet  (  Patient not taking: Reported on 12/09/2021)   telmisartan (MICARDIS) 20 MG tablet  (Patient not taking: Reported on 12/09/2021)   [DISCONTINUED] montelukast (SINGULAIR) 10 MG tablet 1 tablet in the evening once a day orally 30 days once a day orally 30 days   No facility-administered encounter medications on file as of 12/09/2021.    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 left breast lumpectomy, wide excision and a repeat wide excision on 03/10/2010 for multiple positive margins on original resection   Malignant neoplasm of upper-outer quadrant of female breast [facility]) January 2012:    Re-excision to negative margins.    Obesity, unspecified    Postmenopausal bleeding 2013   S/P radiation therapy 2012   whole breast,    Special screening for malignant neoplasms, colon    Past Surgical History:  Procedure Laterality Date   APPENDECTOMY  2008   BREAST BIOPSY Left 2011   pt states had stereo done on mobile unit at Dr. Dwyane Luo office. DCIS   BREAST LUMPECTOMY Left 02/2010   lumpectomy with re excision 03/2010 of left breast for cancer and rad tx   BREAST SURGERY Left 02/17/2010;03/10/2010   lumpectomy and repeat wide excision   CESAREAN SECTION  1998   COLON SURGERY Right 06/13/2006   Hand-assisted right hemicolectomy. 5.8 cm villous adenoma without atypia   COLONOSCOPY  2015   Normal exam.   COLONOSCOPY WITH PROPOFOL N/A 05/29/2019   Procedure: COLONOSCOPY WITH PROPOFOL;  Surgeon: Lucilla Lame, MD;  Location: [facility] ENDOSCOPY;  Service: Endoscopy;  Laterality: N/A;   ESOPHAGOGASTRODUODENOSCOPY (EGD) WITH PROPOFOL N/A 06/21/2015   Procedure: ESOPHAGOGASTRODUODENOSCOPY (EGD) WITH PROPOFOL;  Surgeon: Hulen Luster, MD;  Location: [facility] ENDOSCOPY;  Service: Gastroenterology;  Laterality: N/A;   Family History  Problem Relation Age of Onset   Hypertension Father    Cancer Father        colon   Hypertension Brother    Cancer Other        unknown family member with breast cancer   Colon polyps Other        unknown family member with colon polyps   Breast cancer Neg Hx    Social History   Socioeconomic History   Marital  status: Married    Spouse name: Not on file   Number of children: Not on file   Years of education: Not on file   Highest education level: Not on file  Occupational History   Not on file  Tobacco Use   Smoking status: Never   Smokeless tobacco: Never  Vaping Use   Vaping Use: Never used  Substance and Sexual Activity   Alcohol use: No   Drug use: No   Sexual activity: Yes    Birth control/protection: Post-menopausal  Other Topics Concern   Not on file  Social History Narrative   Not on file   Social Determinants of Health   Financial Resource Strain: Low Risk  (12/09/2021)   Overall Financial Resource Strain (CARDIA)    Difficulty of Paying Living Expenses: Not hard at all  Food Insecurity: No Food Insecurity (12/09/2021)   Hunger Vital Sign    Worried About Running Out of Food in the Last Year:  Never true    Ran Out of Food in the Last Year: Never true  Transportation Needs: No Transportation Needs (12/09/2021)   PRAPARE - Hydrologist (Medical): No    Lack of Transportation (Non-Medical): No  Physical Activity: Insufficiently Active (12/09/2021)   Exercise Vital Sign    Days of Exercise per Week: 3 days    Minutes of Exercise per Session: 30 min  Stress: No Stress Concern Present (12/09/2021)   [place]    Feeling of Stress : Not at all  Social Connections: Moderately Integrated (12/09/2021)   Social Connection and Isolation Panel [NHANES]    Frequency of Communication with Friends and Family: More than three times a week    Frequency of Social Gatherings with Friends and Family: More than three times a week    Attends Religious Services: More than 4 times per year    Active Member of [employer] or Organizations: No    Attends Music therapist: Never    Marital Status: Married    Tobacco Counseling Counseling given: Not Answered   Clinical Intake:  Pre-visit  preparation completed: Yes  Pain : No/denies pain     Nutritional Risks: None Diabetes: No  How often do you need to have someone help you when you read instructions, pamphlets, or other written materials from your doctor or pharmacy?: 1 - Never  Diabetic?no  Interpreter Needed?: No  Information entered by :: Kirke Shaggy, LPN   Activities of Daily Living    12/09/2021   11:43 AM  In your present state of health, do you have any difficulty performing the following activities:  Hearing? 0  Vision? 0  Difficulty concentrating or making decisions? 0  Walking or climbing stairs? 0  Dressing or bathing? 0  Doing errands, shopping? 0  Preparing Food and eating ? N  Using the Toilet? N  In the past six months, have you accidently leaked urine? N  Do you have problems with loss of bowel control? N  Managing your Medications? N  Managing your Finances? N  Housekeeping or managing your Housekeeping? N    Patient Care Team: Olin Hauser, DO as PCP - General (Family Medicine) Bary Castilla Forest Gleason, MD (General Surgery) Ammie Dalton, Okey Regal, MD (Unknown Physician Specialty)  Indicate any recent Medical Services you may have received from other than [facility] providers in the past year (date may be approximate).     Assessment:   This is a routine wellness examination for [place].  Hearing/Vision screen Hearing Screening - Comments:: No aids Vision Screening - Comments:: Wears glasses- [facility]  Dietary issues and exercise activities discussed: Current Exercise Habits: Home exercise routine, Type of exercise: walking, Time (Minutes): 30, Frequency (Times/Week): 3, Weekly Exercise (Minutes/Week): 90, Intensity: Mild   Goals Addressed             This Visit's Progress    DIET - EAT MORE FRUITS AND VEGETABLES         Depression Screen    12/09/2021   11:42 AM 08/04/2021   10:53 AM 02/07/2021    1:58 PM 12/08/2020   10:19 AM 06/30/2020   11:02 AM 06/02/2020    10:45 AM 04/01/2020    9:49 AM  PHQ 2/9 Scores  PHQ - 2 Score 0 0 0 0 0 0 0  PHQ- 9 Score 0     1     Fall Risk  12/09/2021   11:43 AM 02/07/2021    1:58 PM 12/08/2020   10:18 AM 06/30/2020   11:02 AM 04/01/2020    9:49 AM  Fall Risk   Falls in the past year? 0 0 0 0 0  Number falls in past yr: 0      Injury with Fall? 0      Risk for fall due to : No Fall Risks No Fall Risks No Fall Risks    Follow up Falls prevention discussed;Falls evaluation completed Falls evaluation completed Falls evaluation completed      FALL RISK PREVENTION PERTAINING TO THE HOME:  Any stairs in or around the home? Yes  If so, are there any without handrails? No  Home free of loose throw rugs in walkways, pet beds, electrical cords, etc? Yes  Adequate lighting in your home to reduce risk of falls? Yes   ASSISTIVE DEVICES UTILIZED TO PREVENT FALLS:  Life alert? No  Use of a cane, walker or w/c? No  Grab bars in the bathroom? No  Shower chair or bench in shower? No  Elevated toilet seat or a handicapped toilet? No   Cognitive Function:    12/08/2020   10:22 AM 12/08/2019   11:09 AM  MMSE - Mini Mental State Exam  Orientation to time 5 5  Orientation to Place 5 5  Registration 3 3  Attention/ Calculation 5 5  Recall 3 3  Language- name 2 objects 2 2  Language- repeat 1 1  Language- follow 3 step command 3 3  Language- read & follow direction 1 1  Write a sentence 1 1  Copy design 1 1  Total score 30 30        12/09/2021   11:44 AM  6CIT Screen  What Year? 0 points  What month? 0 points  What time? 0 points  Count back from 20 0 points  Months in reverse 0 points  Repeat phrase 0 points  Total Score 0 points    Immunizations There is no immunization history for the selected administration types on file for this patient.  TDAP status: Due, Education has been provided regarding the importance of this vaccine. Advised may receive this vaccine at local pharmacy or Health Dept. Aware  to provide a copy of the vaccination record if obtained from local pharmacy or Health Dept. Verbalized acceptance and understanding.  Flu Vaccine status: Declined, Education has been provided regarding the importance of this vaccine but patient still declined. Advised may receive this vaccine at local pharmacy or Health Dept. Aware to provide a copy of the vaccination record if obtained from local pharmacy or Health Dept. Verbalized acceptance and understanding.  Pneumococcal vaccine status: Declined,  Education has been provided regarding the importance of this vaccine but patient still declined. Advised may receive this vaccine at local pharmacy or Health Dept. Aware to provide a copy of the vaccination record if obtained from local pharmacy or Health Dept. Verbalized acceptance and understanding.   Covid-19 vaccine status: Declined, Education has been provided regarding the importance of this vaccine but patient still declined. Advised may receive this vaccine at local pharmacy or Health Dept.or vaccine clinic. Aware to provide a copy of the vaccination record if obtained from local pharmacy or Health Dept. Verbalized acceptance and understanding.  Qualifies for Shingles Vaccine? Yes   Zostavax completed No   Shingrix Completed?: No.    Education has been provided regarding the importance of this vaccine. Patient has been advised to call  insurance company to determine out of pocket expense if they have not yet received this vaccine. Advised may also receive vaccine at local pharmacy or Health Dept. Verbalized acceptance and understanding.  Screening Tests Health Maintenance  Topic Date Due   TETANUS/TDAP  Never done   Zoster Vaccines- Shingrix (1 of 2) Never done   INFLUENZA VACCINE  Never done   Pneumonia Vaccine 34+ Years old (1 - PCV) 02/07/2022 (Originally 12/28/2018)   COVID-19 Vaccine (1) 02/23/2022 (Originally 12/28/1960)   MAMMOGRAM  03/17/2022   COLONOSCOPY (Pts 45-7yr Insurance  coverage will need to be confirmed)  05/28/2024   DEXA SCAN  Completed   Hepatitis C Screening  Completed   HPV VACCINES  Aged Out    Health Maintenance  Health Maintenance Due  Topic Date Due   TETANUS/TDAP  Never done   Zoster Vaccines- Shingrix (1 of 2) Never done   INFLUENZA VACCINE  Never done    Colorectal cancer screening: Type of screening: Colonoscopy. Completed 05/29/19. Repeat every 5 years  Mammogram status: Completed 03/17/21. Repeat every year  Bone Density status: Completed 03/17/21. Results reflect: Bone density results: OSTEOPENIA. Repeat every 5 years.  Lung Cancer Screening: (Low Dose CT Chest recommended if Age 68-80years, 30 pack-year currently smoking OR have quit w/in 15years.) does not qualify.   Additional Screening:  Hepatitis C Screening: does qualify; Completed 09/04/19  Vision Screening: Recommended annual ophthalmology exams for early detection of glaucoma and other disorders of the eye. Is the patient up to date with their annual eye exam?  Yes  Who is the provider or what is the name of the office in which the patient attends annual eye exams? [facility] pt is not established with a provider, would they like to be referred to a provider to establish care? No .   Dental Screening: Recommended annual dental exams for proper oral hygiene  Community Resource Referral / Chronic Care Management: CRR required this visit?  No   CCM required this visit?  No      Plan:     I have personally reviewed and noted the following in the patient's chart:   Medical and social history Use of alcohol, tobacco or illicit drugs  Current medications and supplements including opioid prescriptions. Patient is not currently taking opioid prescriptions. Functional ability and status Nutritional status Physical activity Advanced directives List of other physicians Hospitalizations, surgeries, and ER visits in previous 12 months Vitals Screenings to include  cognitive, depression, and falls Referrals and appointments  In addition, I have reviewed and discussed with patient certain preventive protocols, quality metrics, and best practice recommendations. A written personalized care plan for preventive services as well as general preventive health recommendations were provided to patient.     LDionisio David LPN   [id number]  Nurse Notes: none

## 2021-12-09 NOTE — Patient Instructions (Signed)
Stacie Ellis , Thank you for taking time to come for your Medicare Wellness Visit. I appreciate your ongoing commitment to your health goals. Please review the following plan we discussed and let me know if I can assist you in the future.   Screening recommendations/referrals: Colonoscopy: 05/29/19 Mammogram: 03/17/21 Bone Density: 03/17/21 Recommended yearly ophthalmology/optometry visit for glaucoma screening and checkup Recommended yearly dental visit for hygiene and checkup  Vaccinations: Influenza vaccine: n/d Pneumococcal vaccine: n/d Tdap vaccine: n/d Shingles vaccine: n/d   Covid-19:n/d  Advanced directives: no  Conditions/risks identified: none  Next appointment: Follow up in one year for your annual wellness visit 10/11/4 @ 10:45 am by phone   Preventive Care 65 Years and Older, Female Preventive care refers to lifestyle choices and visits with your health care provider that can promote health and wellness. What does preventive care include? A yearly physical exam. This is also called an annual well check. Dental exams once or twice a year. Routine eye exams. Ask your health care provider how often you should have your eyes checked. Personal lifestyle choices, including: Daily care of your teeth and gums. Regular physical activity. Eating a healthy diet. Avoiding tobacco and drug use. Limiting alcohol use. Practicing safe sex. Taking low-dose aspirin every day. Taking vitamin and mineral supplements as recommended by your health care provider. What happens during an annual well check? The services and screenings done by your health care provider during your annual well check will depend on your age, overall health, lifestyle risk factors, and family history of disease. Counseling  Your health care provider may ask you questions about your: Alcohol use. Tobacco use. Drug use. Emotional well-being. Home and relationship well-being. Sexual activity. Eating  habits. History of falls. Memory and ability to understand (cognition). Work and work Statistician. Reproductive health. Screening  You may have the following tests or measurements: Height, weight, and BMI. Blood pressure. Lipid and cholesterol levels. These may be checked every 5 years, or more frequently if you are over 35 years old. Skin check. Lung cancer screening. You may have this screening every year starting at age 52 if you have a 30-pack-year history of smoking and currently smoke or have quit within the past 15 years. Fecal occult blood test (FOBT) of the stool. You may have this test every year starting at age 78. Flexible sigmoidoscopy or colonoscopy. You may have a sigmoidoscopy every 5 years or a colonoscopy every 10 years starting at age 50. Hepatitis C blood test. Hepatitis B blood test. Sexually transmitted disease (STD) testing. Diabetes screening. This is done by checking your blood sugar (glucose) after you have not eaten for a while (fasting). You may have this done every 1-3 years. Bone density scan. This is done to screen for osteoporosis. You may have this done starting at age 60. Mammogram. This may be done every 1-2 years. Talk to your health care provider about how often you should have regular mammograms. Talk with your health care provider about your test results, treatment options, and if necessary, the need for more tests. Vaccines  Your health care provider may recommend certain vaccines, such as: Influenza vaccine. This is recommended every year. Tetanus, diphtheria, and acellular pertussis (Tdap, Td) vaccine. You may need a Td booster every 10 years. Zoster vaccine. You may need this after age 10. Pneumococcal 13-valent conjugate (PCV13) vaccine. One dose is recommended after age 74. Pneumococcal polysaccharide (PPSV23) vaccine. One dose is recommended after age 11. Talk to your health care provider about  which screenings and vaccines you need and how  often you need them. This information is not intended to replace advice given to you by your health care provider. Make sure you discuss any questions you have with your health care provider. Document Released: 03/19/2015 Document Revised: 11/10/2015 Document Reviewed: 12/22/2014 Elsevier Interactive Patient Education  2017 [place] Prevention in the Home Falls can cause injuries. They can happen to people of all ages. There are many things you can do to make your home safe and to help prevent falls. What can I do on the outside of my home? Regularly fix the edges of walkways and driveways and fix any cracks. Remove anything that might make you trip as you walk through a door, such as a raised step or threshold. Trim any bushes or trees on the path to your home. Use bright outdoor lighting. Clear any walking paths of anything that might make someone trip, such as rocks or tools. Regularly check to see if handrails are loose or broken. Make sure that both sides of any steps have handrails. Any raised decks and porches should have guardrails on the edges. Have any leaves, snow, or ice cleared regularly. Use sand or salt on walking paths during winter. Clean up any spills in your garage right away. This includes oil or grease spills. What can I do in the bathroom? Use night lights. Install grab bars by the toilet and in the tub and shower. Do not use towel bars as grab bars. Use non-skid mats or decals in the tub or shower. If you need to sit down in the shower, use a plastic, non-slip stool. Keep the floor dry. Clean up any water that spills on the floor as soon as it happens. Remove soap buildup in the tub or shower regularly. Attach bath mats securely with double-sided non-slip rug tape. Do not have throw rugs and other things on the floor that can make you trip. What can I do in the bedroom? Use night lights. Make sure that you have a light by your bed that is easy to  reach. Do not use any sheets or blankets that are too big for your bed. They should not hang down onto the floor. Have a firm chair that has side arms. You can use this for support while you get dressed. Do not have throw rugs and other things on the floor that can make you trip. What can I do in the kitchen? Clean up any spills right away. Avoid walking on wet floors. Keep items that you use a lot in easy-to-reach places. If you need to reach something above you, use a strong step stool that has a grab bar. Keep electrical cords out of the way. Do not use floor polish or wax that makes floors slippery. If you must use wax, use non-skid floor wax. Do not have throw rugs and other things on the floor that can make you trip. What can I do with my stairs? Do not leave any items on the stairs. Make sure that there are handrails on both sides of the stairs and use them. Fix handrails that are broken or loose. Make sure that handrails are as long as the stairways. Check any carpeting to make sure that it is firmly attached to the stairs. Fix any carpet that is loose or worn. Avoid having throw rugs at the top or bottom of the stairs. If you do have throw rugs, attach them to the floor with  carpet tape. Make sure that you have a light switch at the top of the stairs and the bottom of the stairs. If you do not have them, ask someone to add them for you. What else can I do to help prevent falls? Wear shoes that: Do not have high heels. Have rubber bottoms. Are comfortable and fit you well. Are closed at the toe. Do not wear sandals. If you use a stepladder: Make sure that it is fully opened. Do not climb a closed stepladder. Make sure that both sides of the stepladder are locked into place. Ask someone to hold it for you, if possible. Clearly mark and make sure that you can see: Any grab bars or handrails. First and last steps. Where the edge of each step is. Use tools that help you move  around (mobility aids) if they are needed. These include: Canes. Walkers. Scooters. Crutches. Turn on the lights when you go into a dark area. Replace any light bulbs as soon as they burn out. Set up your furniture so you have a clear path. Avoid moving your furniture around. If any of your floors are uneven, fix them. If there are any pets around you, be aware of where they are. Review your medicines with your doctor. Some medicines can make you feel dizzy. This can increase your chance of falling. Ask your doctor what other things that you can do to help prevent falls. This information is not intended to replace advice given to you by your health care provider. Make sure you discuss any questions you have with your health care provider. Document Released: 12/17/2008 Document Revised: 07/29/2015 Document Reviewed: 03/27/2014 Elsevier Interactive Patient Education  2017 [employer].

## 2021-12-12 ENCOUNTER — Ambulatory Visit: Payer: Self-pay | Admitting: *Deleted

## 2021-12-12 NOTE — Patient Outreach (Signed)
  Care Coordination   Follow Up Visit Note   12/12/2021 Name: Stacie Ellis MRN: [id number] DOB: 10/06/1955  Stacie Ellis is a 66 y.o. year old female who sees Olin Hauser, DO for primary care. I spoke with  Ansel Bong by phone today.  What matters to the patients health and wellness today?  Caregiver Stress    Goals Addressed             This Visit's Progress    Caregiver Stress Management       Care Coordination Interventions: Patient confirms that her sister was not able to be seen at [place], however her sister was able to resume care with [facility] Patient's sister continues to exhibit tantrums and intrusive behaviors Patient's sister continues to attend a program called [facility], through [facility] Monday-Thursday Provided resources for a Peer Support Specialist through [employer] as well as Sport and exercise psychologist program for Intellectual and Developmental disabilities [phone] Patients agreeable to discussing the peer support program with [facility] during next appointment as well as [place]         SDOH assessments and interventions completed:  No     Care Coordination Interventions Activated:  Yes  Care Coordination Interventions:  Yes, provided   Follow up plan: No further intervention required.   Encounter Outcome:  Pt. Visit Completed

## 2021-12-12 NOTE — Patient Instructions (Signed)
Visit Information  Thank you for taking time to visit with me today. Please don't hesitate to contact me if I can be of assistance to you.   Following are the goals we discussed today:   Goals Addressed             This Visit's Progress    Caregiver Stress Management       Care Coordination Interventions: Patient confirms that her sister was not able to be seen at [place], however her sister was able to resume care with [facility] Patient's sister continues to exhibit tantrums and intrusive behaviors Patient's sister continues to attend a program called [facility], through [facility] Monday-Thursday Provided resources for a Peer Support Specialist through [employer] as well as Sport and exercise psychologist program for Intellectual and Developmental disabilities [phone] Patients agreeable to discussing the peer support program with [facility] during next appointment as well as [place]        If you are experiencing a [place] or [place] or need someone to talk to, please call the Suicide and Crisis Lifeline: 988 call 911   Patient verbalizes understanding of instructions and care plan provided today and agrees to view in [place]. Active MyChart status and patient understanding of how to access instructions and care plan via MyChart confirmed with patient.     No further follow up required: patient to call this Education officer, museum with any additional community resource needs  [employer], [place] Worker  [facility] Care Management [phone]

## 2021-12-13 DIAGNOSIS — J301 Allergic rhinitis due to pollen: Secondary | ICD-10-CM | POA: Diagnosis not present

## 2021-12-13 DIAGNOSIS — J3089 Other allergic rhinitis: Secondary | ICD-10-CM | POA: Diagnosis not present

## 2021-12-13 DIAGNOSIS — J3081 Allergic rhinitis due to animal (cat) (dog) hair and dander: Secondary | ICD-10-CM | POA: Diagnosis not present

## 2021-12-14 ENCOUNTER — Ambulatory Visit: Payer: PPO | Admitting: Nurse Practitioner

## 2021-12-20 DIAGNOSIS — J301 Allergic rhinitis due to pollen: Secondary | ICD-10-CM | POA: Diagnosis not present

## 2021-12-20 DIAGNOSIS — J3081 Allergic rhinitis due to animal (cat) (dog) hair and dander: Secondary | ICD-10-CM | POA: Diagnosis not present

## 2021-12-20 DIAGNOSIS — J3089 Other allergic rhinitis: Secondary | ICD-10-CM | POA: Diagnosis not present

## 2021-12-27 DIAGNOSIS — J3081 Allergic rhinitis due to animal (cat) (dog) hair and dander: Secondary | ICD-10-CM | POA: Diagnosis not present

## 2021-12-27 DIAGNOSIS — J3089 Other allergic rhinitis: Secondary | ICD-10-CM | POA: Diagnosis not present

## 2021-12-27 DIAGNOSIS — J301 Allergic rhinitis due to pollen: Secondary | ICD-10-CM | POA: Diagnosis not present

## 2022-01-03 DIAGNOSIS — J3089 Other allergic rhinitis: Secondary | ICD-10-CM | POA: Diagnosis not present

## 2022-01-03 DIAGNOSIS — J3081 Allergic rhinitis due to animal (cat) (dog) hair and dander: Secondary | ICD-10-CM | POA: Diagnosis not present

## 2022-01-03 DIAGNOSIS — J301 Allergic rhinitis due to pollen: Secondary | ICD-10-CM | POA: Diagnosis not present

## 2022-01-10 ENCOUNTER — Other Ambulatory Visit: Payer: Self-pay

## 2022-01-10 DIAGNOSIS — Z0000 Encounter for general adult medical examination without abnormal findings: Secondary | ICD-10-CM

## 2022-01-10 DIAGNOSIS — I10 Essential (primary) hypertension: Secondary | ICD-10-CM

## 2022-01-10 DIAGNOSIS — E782 Mixed hyperlipidemia: Secondary | ICD-10-CM

## 2022-01-10 DIAGNOSIS — J3089 Other allergic rhinitis: Secondary | ICD-10-CM | POA: Diagnosis not present

## 2022-01-10 DIAGNOSIS — J301 Allergic rhinitis due to pollen: Secondary | ICD-10-CM | POA: Diagnosis not present

## 2022-01-10 DIAGNOSIS — R7303 Prediabetes: Secondary | ICD-10-CM

## 2022-01-10 DIAGNOSIS — J3081 Allergic rhinitis due to animal (cat) (dog) hair and dander: Secondary | ICD-10-CM | POA: Diagnosis not present

## 2022-01-11 ENCOUNTER — Other Ambulatory Visit: Payer: Self-pay | Admitting: Nurse Practitioner

## 2022-01-11 ENCOUNTER — Other Ambulatory Visit: Payer: PPO

## 2022-01-11 DIAGNOSIS — R7303 Prediabetes: Secondary | ICD-10-CM

## 2022-01-11 DIAGNOSIS — E782 Mixed hyperlipidemia: Secondary | ICD-10-CM | POA: Diagnosis not present

## 2022-01-11 DIAGNOSIS — Z0000 Encounter for general adult medical examination without abnormal findings: Secondary | ICD-10-CM | POA: Diagnosis not present

## 2022-01-11 DIAGNOSIS — R7309 Other abnormal glucose: Secondary | ICD-10-CM | POA: Diagnosis not present

## 2022-01-11 DIAGNOSIS — I10 Essential (primary) hypertension: Secondary | ICD-10-CM | POA: Diagnosis not present

## 2022-01-11 DIAGNOSIS — D0512 Intraductal carcinoma in situ of left breast: Secondary | ICD-10-CM

## 2022-01-12 LAB — COMPLETE METABOLIC PANEL WITH GFR
AG Ratio: 1.4 (calc) (ref 1.0–2.5)
ALT: 15 U/L (ref 6–29)
AST: 19 U/L (ref 10–35)
Albumin: 4 g/dL (ref 3.6–5.1)
Alkaline phosphatase (APISO): 70 U/L (ref 37–153)
BUN: 14 mg/dL (ref 7–25)
CO2: 32 mmol/L (ref 20–32)
Calcium: 9.3 mg/dL (ref 8.6–10.4)
Chloride: 101 mmol/L (ref 98–110)
Creat: 0.64 mg/dL (ref 0.50–1.05)
Globulin: 2.9 g/dL (calc) (ref 1.9–3.7)
Glucose, Bld: 89 mg/dL (ref 65–99)
Potassium: 3.7 mmol/L (ref 3.5–5.3)
Sodium: 141 mmol/L (ref 135–146)
Total Bilirubin: 0.5 mg/dL (ref 0.2–1.2)
Total Protein: 6.9 g/dL (ref 6.1–8.1)
eGFR: 96 mL/min/{1.73_m2} (ref >=60)

## 2022-01-12 LAB — CBC WITH DIFFERENTIAL/PLATELET
Absolute Monocytes: 462 cells/uL (ref 200–950)
Basophils Absolute: 40 cells/uL (ref 0–200)
Basophils Relative: 0.9 %
Eosinophils Absolute: 260 cells/uL (ref 15–500)
Eosinophils Relative: 5.9 %
HCT: 39.9 % (ref 35.0–45.0)
Hemoglobin: 13.3 g/dL (ref 11.7–15.5)
Lymphs Abs: 1791 cells/uL (ref 850–3900)
MCH: 28.1 pg (ref 27.0–33.0)
MCHC: 33.3 g/dL (ref 32.0–36.0)
MCV: 84.4 fL (ref 80.0–100.0)
MPV: 11.7 fL (ref 7.5–12.5)
Monocytes Relative: 10.5 %
Neutro Abs: 1848 cells/uL (ref 1500–7800)
Neutrophils Relative %: 42 %
Platelets: 226 10*3/uL (ref 140–400)
RBC: 4.73 10*6/uL (ref 3.80–5.10)
RDW: 13.2 % (ref 11.0–15.0)
Total Lymphocyte: 40.7 %
WBC: 4.4 10*3/uL (ref 3.8–10.8)

## 2022-01-12 LAB — LIPID PANEL
Cholesterol: 227 mg/dL — ABNORMAL HIGH (ref <200)
HDL: 66 mg/dL (ref >=50)
LDL Cholesterol (Calc): 143 mg/dL (calc) — ABNORMAL HIGH
Non-HDL Cholesterol (Calc): 161 mg/dL (calc) — ABNORMAL HIGH (ref <130)
Total CHOL/HDL Ratio: 3.4 (calc) (ref <5.0)
Triglycerides: 80 mg/dL (ref <150)

## 2022-01-12 LAB — HEMOGLOBIN A1C W/OUT EAG: Hgb A1c MFr Bld: 6 % of total Hgb — ABNORMAL HIGH (ref <5.7)

## 2022-01-16 ENCOUNTER — Other Ambulatory Visit: Payer: Self-pay | Admitting: Family Medicine

## 2022-01-16 ENCOUNTER — Encounter: Payer: Self-pay | Admitting: Family Medicine

## 2022-01-16 ENCOUNTER — Ambulatory Visit (INDEPENDENT_AMBULATORY_CARE_PROVIDER_SITE_OTHER): Payer: PPO | Admitting: Family Medicine

## 2022-01-16 VITALS — BP 142/83 | HR 78 | Ht 60.0 in | Wt 152.4 lb

## 2022-01-16 DIAGNOSIS — R7303 Prediabetes: Secondary | ICD-10-CM

## 2022-01-16 DIAGNOSIS — E782 Mixed hyperlipidemia: Secondary | ICD-10-CM | POA: Diagnosis not present

## 2022-01-16 DIAGNOSIS — Z0000 Encounter for general adult medical examination without abnormal findings: Secondary | ICD-10-CM | POA: Diagnosis not present

## 2022-01-16 DIAGNOSIS — K219 Gastro-esophageal reflux disease without esophagitis: Secondary | ICD-10-CM

## 2022-01-16 DIAGNOSIS — D0512 Intraductal carcinoma in situ of left breast: Secondary | ICD-10-CM | POA: Diagnosis not present

## 2022-01-16 DIAGNOSIS — I10 Essential (primary) hypertension: Secondary | ICD-10-CM | POA: Diagnosis not present

## 2022-01-16 MED ORDER — CARVEDILOL 25 MG PO TABS: 25.0000 mg | ORAL_TABLET | Freq: Two times a day (BID) | ORAL | 3 refills | Status: DC

## 2022-01-16 MED ORDER — PANTOPRAZOLE SODIUM 40 MG PO TBEC: 40.0000 mg | DELAYED_RELEASE_TABLET | Freq: Every day | ORAL | 3 refills | Status: DC

## 2022-01-16 NOTE — Assessment & Plan Note (Signed)
Mild elevated A1c to 6.0, prior range 5.6 to 5.9 Fam history diabetes  Plan:  1. Not on any therapy currently  2. Encourage improved lifestyle - low carb, low sugar diet, reduce portion size, continue improving regular exercise 3. Follow-up 6 mo

## 2022-01-16 NOTE — Assessment & Plan Note (Signed)
Mostly controlled on PPI Continue Pantoprazole '40mg'$  daily Discussed potential risks of PPI therapy long term, we agree to continue therapy

## 2022-01-16 NOTE — Assessment & Plan Note (Signed)
Elevated LDL >140 Last lipid panel 01/2022 The 10-year ASCVD risk score (Arnett DK, et al., 2019) is: 12.4% Failed Rosuvastatin '5mg'$  and Atorvastatin '10mg'$  - myalgia  Plan: Defer med today, discussed Statins, Zetia, PCSK9 Encourage improved lifestyle - low carb/cholesterol, reduce portion size, continue improving regular exercise  F/u labs lipid 6 mo

## 2022-01-16 NOTE — Assessment & Plan Note (Signed)
Mildly elevated initial BP, repeat manual check improved Home BP cuff calibrated Followed by Cardiology [facility] Renal artery dopplers negative No other underlying cause of HYPERTENSION identified OFF Lisinopril, HCTZ, Telmisartan   Plan:  1. Continue current BP regimen -  - Amlodipine 2.'5mg'$  BID - Carvedilol '25mg'$  TWICE A DAY - Chlorthalidone 12.'5mg'$  (half of '25mg'$ )  2. Encourage improved lifestyle - low sodium diet, regular exercise 3. Continue monitor BP outside office, bring readings to next visit, if persistently >140/90 or new symptoms notify office sooner

## 2022-01-16 NOTE — Progress Notes (Signed)
Subjective:    Patient ID: Stacie Ellis, female    DOB: 10-24-1955, 66 y.o.   MRN: [id number]  Stacie Ellis is a 66 y.o. female presenting on 01/16/2022 for Annual Exam   HPI  Here for Annual Physical and Lab Review.  HYPERLIPIDEMIA: - Reports no concerns. Last lipid panel 01/2022, elevated LDL 140s stable from previous few years - Currently taking no medication, failed Rosuvastatin 71m and Atorvastatin 151mpreviously Lifestyle - Diet: goal to improve cholesterol   PreDM Last A1c 6.0 Prior range 5.6 to 5.9 She has Ellis fam history of DIABETES Admits higher carb. Weight loss 10 lbs in 1 year.  PMH GERD / Hoarseness of Voice On PPI Pantoprazole 4077maily, has some breakthrough symptoms. Question PPI if future risks but okay to take  CHRONIC HTN: [facility] done well with med adjustments overall Prior work up and eval done. She has been on BP medication since age [age]se has significant fam history of father and older brother with HTN diagnosed in their 20s64sHer BP cuff similar to our readings    Current Meds - Amlodipine 2.5mg49mD - Carvedilol 25mg72mCE A DAY - Chlorthalidone 12.5mg (85mf of 25mg) 37m Lisinopril, HCTZ, Telmisartan    Reports good compliance, took meds today. Tolerating well, w/o complaints.  Denies CP, dyspnea, HA, edema, dizziness / lightheadedness    Breast Cancer 2012 Dr ByrnettBary Castillal lumpectomy without clear margins, required repeat excision.      12/09/2021   11:42 AM 08/04/2021   10:53 AM 02/07/2021    1:58 PM  Depression screen PHQ 2/9  Decreased Interest 0 0 0  Down, Depressed, Hopeless 0 0 0  PHQ - 2 Score 0 0 0  Altered sleeping 0    Tired, decreased energy 0    Change in appetite 0    Feeling bad or failure about yourself  0    Trouble concentrating 0    Moving slowly or fidgety/restless 0    Suicidal thoughts 0    PHQ-9 Score 0    Difficult doing work/chores  Not difficult at all      Past Medical History:  Diagnosis Date   Asthma    Breast cancer (HCC) 12[place]   GERD (gastroesophageal reflux disease) 2008   Hypertension    Malignant neoplasm of upper-outer quadrant of female breast (HCC) 20[place]  Intermediate grade DCIS, ER: 50%; PR 10%. left breast lumpectomy, wide excision and a repeat wide excision on 03/10/2010 for multiple positive margins on original resection   [facility]    Re-excision to negative margins.    Obesity, unspecified    Postmenopausal bleeding 2013   S/P radiation therapy 2012   whole breast,    Special screening for malignant neoplasms, colon    Past Surgical History:  Procedure Laterality Date   APPENDECTOMY  2008   BREAST BIOPSY Left 2011   pt states had stereo done on mobile unit at Dr. ByrnettDwyane Luo. DCIS   BREAST LUMPECTOMY Left 02/2010   lumpectomy with re excision 03/2010 of left breast for cancer and rad tx   BREAST SURGERY Left 02/17/2010;03/10/2010   lumpectomy and repeat wide excision   CESAREAN SECTION  1998   COLON SURGERY Right 06/13/2006   Hand-assisted right hemicolectomy. 5.8 cm villous adenoma without atypia   COLONOSCOPY  2015   Normal exam.   COLONOSCOPY WITH PROPOFOL  N/A 05/29/2019   Procedure: COLONOSCOPY WITH PROPOFOL;  Surgeon: Lucilla Lame, MD;  Location: [facility] END[facility]COPY;  Service: Endoscopy;  Laterality: N/A;   ESOPHAGOGASTRODUODENOSCOPY (EGD) WITH PROPOFOL N/A 06/21/2015   Procedure: ESOPHAGOGASTRODUODENOSCOPY (EGD) WITH PROPOFOL;  Surgeon: Hulen Luster, MD;  Location: [facility] ENDOSCOPY;  Service: Gastroenterology;  Laterality: N/A;   Social History   Socioeconomic History   Marital status: Married    Spouse name: Not on file   Number of children: Not on file   Years of education: Not on file   Highest education level: Not on file  Occupational History   Not on file  Tobacco Use   Smoking status: Never   Smokeless  tobacco: Never  Vaping Use   Vaping Use: Never used  Substance and Sexual Activity   Alcohol use: No   Drug use: No   Sexual activity: Yes    Birth control/protection: Post-menopausal  Other Topics Concern   Not on file  Social History Narrative   Not on file   Social Determinants of Health   Financial Resource Strain: Low Risk  (12/09/2021)   Overall Financial Resource Strain (CARDIA)    Difficulty of Paying Living Expenses: Not hard at all  Food Insecurity: No Food Insecurity (12/09/2021)   Hunger Vital Sign    Worried About Running Out of Food in the Last Year: Never true    [place] in the Last Year: Never true  Transportation Needs: No Transportation Needs (12/09/2021)   PRAPARE - Hydrologist (Medical): No    Lack of Transportation (Non-Medical): No  Physical Activity: Insufficiently Active (12/09/2021)   Exercise Vital Sign    Days of Exercise per Week: 3 days    Minutes of Exercise per Session: 30 min  Stress: No Stress Concern Present (12/09/2021)   [place]    Feeling of Stress : Not at all  Social Connections: Moderately Integrated (12/09/2021)   Social Connection and Isolation Panel [NHANES]    Frequency of Communication with Friends and Family: More than three times a week    Frequency of Social Gatherings with Friends and Family: More than three times a week    Attends Religious Services: More than 4 times per year    Active Member of [employer] or Organizations: No    Attends Archivist Meetings: Never    Marital Status: Married  Human resources officer Violence: Not At Risk (12/09/2021)   Humiliation, Afraid, Rape, and Kick questionnaire    Fear of Current or Ex-Partner: No    Emotionally Abused: No    Physically Abused: No    Sexually Abused: No   Family History  Problem Relation Age of Onset   Hypertension Father    Cancer Father        colon    Hypertension Brother    Cancer Other        unknown family member with breast cancer   Colon polyps Other        unknown family member with colon polyps   Breast cancer Neg Hx    Current Outpatient Medications on File Prior to Visit  Medication Sig   albuterol (PROVENTIL HFA;VENTOLIN HFA) 108 (90 Base) MCG/ACT inhaler Inhale 2 puffs into the lungs every 6 (six) hours as needed for wheezing or shortness of breath.   amLODipine (NORVASC) 2.5 MG tablet Take 1 tablet (2.5 mg total) by mouth 2 (two) times daily.  aspirin 81 MG tablet Take 81 mg by mouth daily.   budesonide-formoterol (SYMBICORT) 80-4.5 MCG/ACT inhaler Inhale 2 puffs into the lungs 2 (two) times daily.   chlorthalidone (HYGROTON) 25 MG tablet Take 0.5 tablets (12.5 mg total) by mouth daily.   Cholecalciferol (VITAMIN D3) 50 MCG (2000 UT) capsule Take 2 capsules (4,000 Units total) by mouth daily.   EPINEPHrine 0.3 mg/0.3 mL IJ SOAJ injection See admin instructions.   fluticasone (FLONASE) 50 MCG/ACT nasal spray 1-2 sprays   halobetasol (ULTRAVATE) 0.05 % cream Apply topically daily.   loratadine (CLARITIN) 10 MG tablet Take 10 mg by mouth daily.   Magnesium Glycinate 100 MG CAPS Take 200 mg by mouth daily.   montelukast (SINGULAIR) 10 MG tablet TAKE 1 TABLET(10 MG) BY MOUTH AT BEDTIME   potassium chloride SA (KLOR-CON M) 20 MEQ tablet Take 2 tablets (40 mEq total) by mouth daily.   Spacer/Aero-Holding Chambers (AEROCHAMBER MV) inhaler See admin instructions.   No current facility-administered medications on file prior to visit.    Review of Systems  Constitutional:  Negative for activity change, appetite change, chills, diaphoresis, fatigue and fever.  HENT:  Negative for congestion and hearing loss.   Eyes:  Negative for visual disturbance.  Respiratory:  Negative for cough, chest tightness, shortness of breath and wheezing.   Cardiovascular:  Negative for chest pain, palpitations and leg swelling.  Gastrointestinal:   Negative for abdominal pain, constipation, diarrhea, nausea and vomiting.  Genitourinary:  Negative for dysuria, frequency and hematuria.  Musculoskeletal:  Negative for arthralgias and neck pain.  Skin:  Negative for rash.  Neurological:  Negative for dizziness, weakness, light-headedness, numbness and headaches.  Hematological:  Negative for adenopathy.  Psychiatric/Behavioral:  Negative for behavioral problems, dysphoric mood and sleep disturbance.    Per HPI unless specifically indicated above      Objective:    BP (!) 142/83 (BP Location: Left Arm, Cuff Size: Normal)   Pulse 78   Ht 5' (1.524 m)   Wt 152 lb 6.4 oz (69.1 kg)   SpO2 97%   BMI 29.76 kg/m   Wt Readings from Last 3 Encounters:  01/16/22 152 lb 6.4 oz (69.1 kg)  12/09/21 150 lb (68 kg)  10/07/21 150 lb 2.1 oz (68.1 kg)    Physical Exam Vitals and nursing note reviewed.  Constitutional:      General: She is not in acute distress.    Appearance: She is well-developed. She is not diaphoretic.     Comments: Well-appearing, comfortable, cooperative  HENT:     Head: Normocephalic and atraumatic.  Eyes:     General:        Right eye: No discharge.        Left eye: No discharge.     Conjunctiva/sclera: Conjunctivae normal.     Pupils: Pupils are equal, round, and reactive to light.  Neck:     Thyroid: No thyromegaly.     Vascular: No carotid bruit.  Cardiovascular:     Rate and Rhythm: Normal rate and regular rhythm.     Pulses: Normal pulses.     Heart sounds: Normal heart sounds. No murmur heard. Pulmonary:     Effort: Pulmonary effort is normal. No respiratory distress.     Breath sounds: Normal breath sounds. No wheezing or rales.  Abdominal:     General: Bowel sounds are normal. There is no distension.     Palpations: Abdomen is soft. There is no mass.     Tenderness: There  is no abdominal tenderness.  Musculoskeletal:        General: No tenderness. Normal range of motion.     Cervical back: Normal  range of motion and neck supple.     Right lower leg: No edema.     Left lower leg: No edema.     Comments: Upper / Lower Extremities: - Normal muscle tone, strength bilateral upper extremities 5/5, lower extremities 5/5  Lymphadenopathy:     Cervical: No cervical adenopathy.  Skin:    General: Skin is warm and dry.     Findings: No erythema or rash.  Neurological:     Mental Status: She is alert and oriented to person, place, and time.     Comments: Distal sensation intact to light touch all extremities  Psychiatric:        Mood and Affect: Mood normal.        Behavior: Behavior normal.        Thought Content: Thought content normal.     Comments: Well groomed, good eye contact, normal speech and thoughts       Results for orders placed or performed in visit on 01/10/22  Lipid panel  Result Value Ref Range   Cholesterol 227 (H) <200 mg/dL   HDL 66 > OR = 50 mg/dL   Triglycerides 80 <150 mg/dL   LDL Cholesterol (Calc) 143 (H) mg/dL (calc)   Total CHOL/HDL Ratio 3.4 <5.0 (calc)   Non-HDL Cholesterol (Calc) 161 (H) <130 mg/dL (calc)  CBC with Differential/Platelet  Result Value Ref Range   WBC 4.4 3.8 - 10.8 Thousand/uL   RBC 4.73 3.80 - 5.10 Million/uL   Hemoglobin 13.3 11.7 - 15.5 g/dL   HCT 39.9 35.0 - 45.0 %   MCV 84.4 80.0 - 100.0 fL   MCH 28.1 27.0 - 33.0 pg   MCHC 33.3 32.0 - 36.0 g/dL   RDW 13.2 11.0 - 15.0 %   Platelets 226 140 - 400 Thousand/uL   MPV 11.7 7.5 - 12.5 fL   Neutro Abs 1,848 1,500 - 7,800 cells/uL   Lymphs Abs 1,791 850 - 3,900 cells/uL   Absolute Monocytes 462 200 - 950 cells/uL   Eosinophils Absolute 260 15 - 500 cells/uL   Basophils Absolute 40 0 - 200 cells/uL   Neutrophils Relative % 42 %   Total Lymphocyte 40.7 %   Monocytes Relative 10.5 %   Eosinophils Relative 5.9 %   Basophils Relative 0.9 %  COMPLETE METABOLIC PANEL WITH GFR  Result Value Ref Range   Glucose, Bld 89 65 - 99 mg/dL   BUN 14 7 - 25 mg/dL   Creat 0.64 0.50 - 1.05  mg/dL   eGFR 96 > OR = 60 mL/min/1.58m   BUN/Creatinine Ratio SEE NOTE: 6 - 22 (calc)   Sodium 141 135 - 146 mmol/L   Potassium 3.7 3.5 - 5.3 mmol/L   Chloride 101 98 - 110 mmol/L   CO2 32 20 - 32 mmol/L   Calcium 9.3 8.6 - 10.4 mg/dL   Total Protein 6.9 6.1 - 8.1 g/dL   Albumin 4.0 3.6 - 5.1 g/dL   Globulin 2.9 1.9 - 3.7 g/dL (calc)   AG Ratio 1.4 1.0 - 2.5 (calc)   Total Bilirubin 0.5 0.2 - 1.2 mg/dL   Alkaline phosphatase (APISO) 70 37 - 153 U/L   AST 19 10 - 35 U/L   ALT 15 6 - 29 U/L  Hemoglobin A1C w/out eAG  Result Value Ref Range  Hgb A1c MFr Bld 6.0 (H) <5.7 % of total Hgb      Assessment & Plan:   Problem List Items Addressed This Visit     Essential hypertension    Mildly elevated initial BP, repeat manual check improved Home BP cuff calibrated Followed by Cardiology [facility] Renal artery dopplers negative No other underlying cause of HYPERTENSION identified OFF Lisinopril, HCTZ, Telmisartan   Plan:  1. Continue current BP regimen -  - Amlodipine 2.48m BID - Carvedilol 242mTWICE A DAY - Chlorthalidone 12.81m67mhalf of 281m14m2. Encourage improved lifestyle - low sodium diet, regular exercise 3. Continue monitor BP outside office, bring readings to next visit, if persistently >140/90 or new symptoms notify office sooner      Relevant Medications   carvedilol (COREG) 25 MG tablet   Gastroesophageal reflux disease without esophagitis    Mostly controlled on PPI Continue Pantoprazole 40mg47mly Discussed potential risks of PPI therapy long term, we agree to continue therapy      Relevant Medications   pantoprazole (PROTONIX) 40 MG tablet   Mixed hyperlipidemia    Elevated LDL >140 Last lipid panel 01/2022 The 10-year ASCVD risk score (Arnett DK, et al., 2019) is: 12.4% Failed Rosuvastatin 81mg a56mAtorvastatin 10mg -36mlgia  Plan: Defer med today, discussed Statins, Zetia, PCSK9 Encourage improved lifestyle - low  carb/cholesterol, reduce portion size, continue improving regular exercise  F/u labs lipid 6 mo      Relevant Medications   carvedilol (COREG) 25 MG tablet   Pre-diabetes    Mild elevated A1c to 6.0, prior range 5.6 to 5.9 Fam history diabetes  Plan:  1. Not on any therapy currently  2. Encourage improved lifestyle - low carb, low sugar diet, reduce portion size, continue improving regular exercise 3. Follow-up 6 mo       Other Visit Diagnoses     Annual physical exam    -  Primary   Ductal carcinoma in situ (DCIS) of left breast       Relevant Medications   carvedilol (COREG) 25 MG tablet       Updated Health Maintenance information Reviewed recent lab results with patient Encouraged improvement to lifestyle with diet and exercise Goal of weight loss   Meds ordered this encounter  Medications   pantoprazole (PROTONIX) 40 MG tablet    Sig: Take 1 tablet (40 mg total) by mouth daily before breakfast.    Dispense:  90 tablet    Refill:  3   carvedilol (COREG) 25 MG tablet    Sig: Take 1 tablet (25 mg total) by mouth 2 (two) times daily with a meal.    Dispense:  180 tablet    Refill:  3      Follow up plan: Return in about 6 months (around 07/17/2022) for 6 month fasting lab only then 1 week later Follow-up PreDM HLD updates.  Repeat lab A1c Lipid in 6 months  AlexandNobie PutnamutRiverview Park11/13/2023, 11:43 AM

## 2022-01-16 NOTE — Patient Instructions (Addendum)
Thank you for coming to the office today.  Recent Labs    01/11/22 0859  HGBA1C 6.0*   Lipid Panel     Component Value Date/Time   CHOL 227 (H) 01/11/2022 0859   CHOL 229 (H) 12/08/2020 1136   TRIG 80 01/11/2022 0859   HDL 66 01/11/2022 0859   HDL 62 12/08/2020 1136   CHOLHDL 3.4 01/11/2022 0859   LDLCALC 143 (H) 01/11/2022 0859   LABVLDL 22 12/08/2020 1136   Keep improving A1c and Lipid LDL  For now we can defer medications on these  Keep on Pantoprazole  Refills sent. Med list updated  Continue BP medications.     DUE for FASTING BLOOD WORK (no food or drink after midnight before the lab appointment, only water or coffee without cream/sugar on the morning of)  SCHEDULE "Lab Only" visit in the morning at the clinic for lab draw in 6 MONTHS   - Make sure Lab Only appointment is at about 1 week before your next appointment, so that results will be available  For Lab Results, once available within 2-3 days of blood draw, you can can log in to MyChart online to view your results and a brief explanation. Also, we can discuss results at next follow-up visit.   Please schedule a Follow-up Appointment to: Return in about 6 months (around 07/17/2022) for 6 month fasting lab only then 1 week later Follow-up PreDM HLD updates.  If you have any other questions or concerns, please feel free to call the office or send a message through [place]. You may also schedule an earlier appointment if necessary.  Additionally, you may be receiving a survey about your experience at our office within a few days to 1 week by e-mail or mail. We value your feedback.  Nobie Putnam, DO [place]

## 2022-01-17 ENCOUNTER — Ambulatory Visit: Payer: PPO

## 2022-01-17 DIAGNOSIS — J3081 Allergic rhinitis due to animal (cat) (dog) hair and dander: Secondary | ICD-10-CM | POA: Diagnosis not present

## 2022-01-17 DIAGNOSIS — J3089 Other allergic rhinitis: Secondary | ICD-10-CM | POA: Diagnosis not present

## 2022-01-17 DIAGNOSIS — J301 Allergic rhinitis due to pollen: Secondary | ICD-10-CM | POA: Diagnosis not present

## 2022-01-18 ENCOUNTER — Ambulatory Visit: Payer: PPO | Admitting: Family Medicine

## 2022-01-19 DIAGNOSIS — J3081 Allergic rhinitis due to animal (cat) (dog) hair and dander: Secondary | ICD-10-CM | POA: Diagnosis not present

## 2022-01-23 IMAGING — MG DIGITAL SCREENING BILAT W/ TOMO W/ CAD
8 series · 8 of 24 positions shown · non-contrast
Comparison: Previous exam(s).

CLINICAL DATA: Screening.

EXAM:
DIGITAL SCREENING BILATERAL MAMMOGRAM WITH TOMO AND CAD

[R MLO synth-2D]
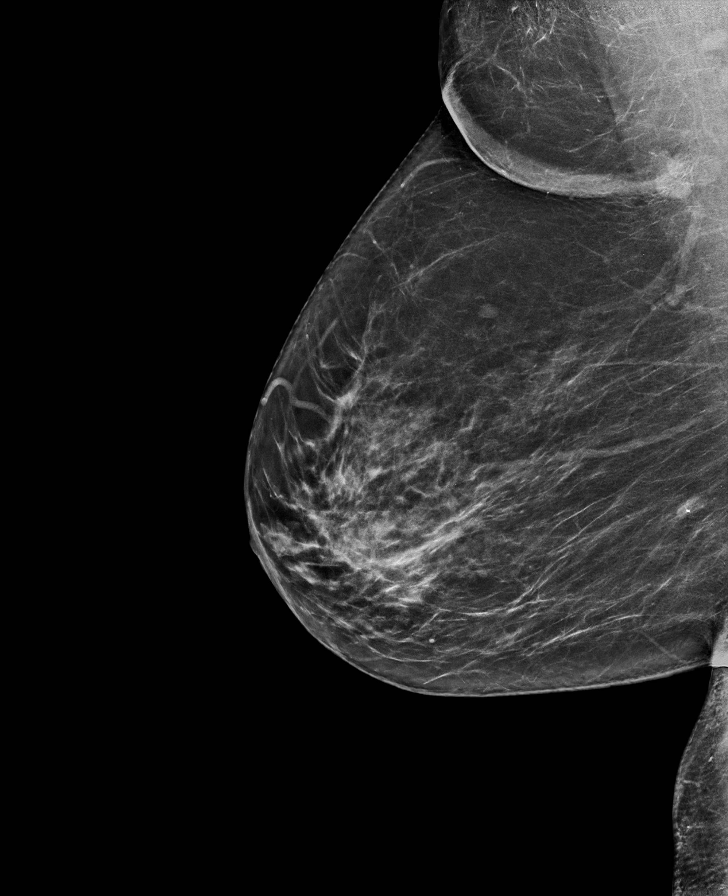

[L CC synth-2D]
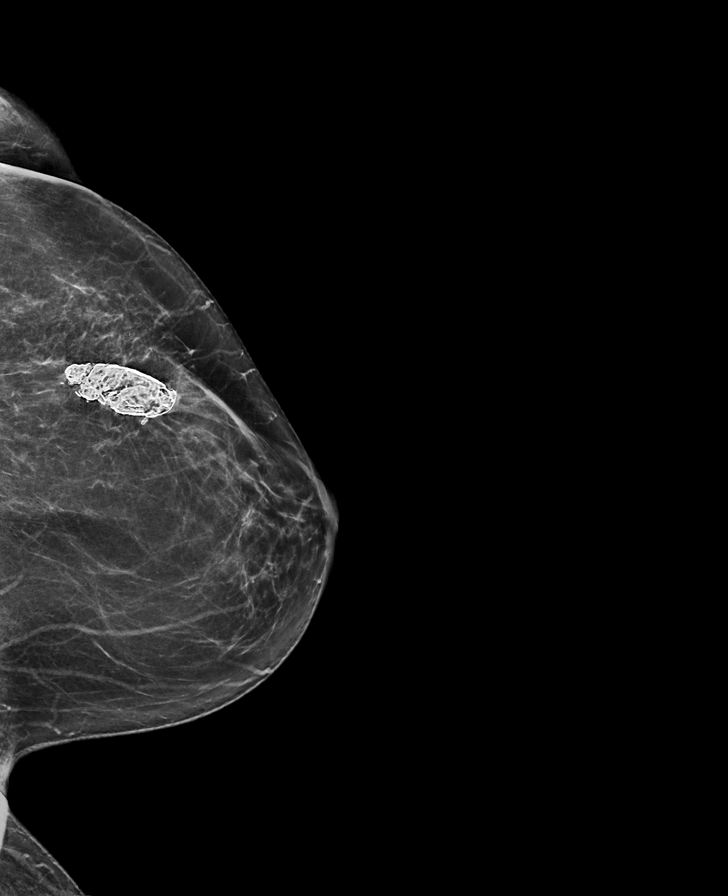

[L MLO synth-2D]
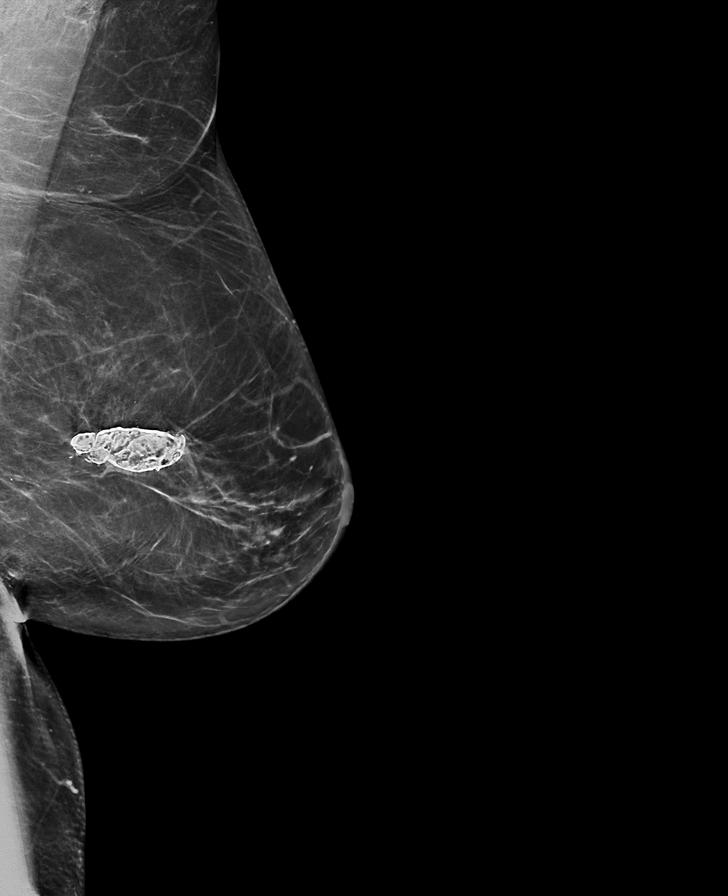

[R CC synth-2D]
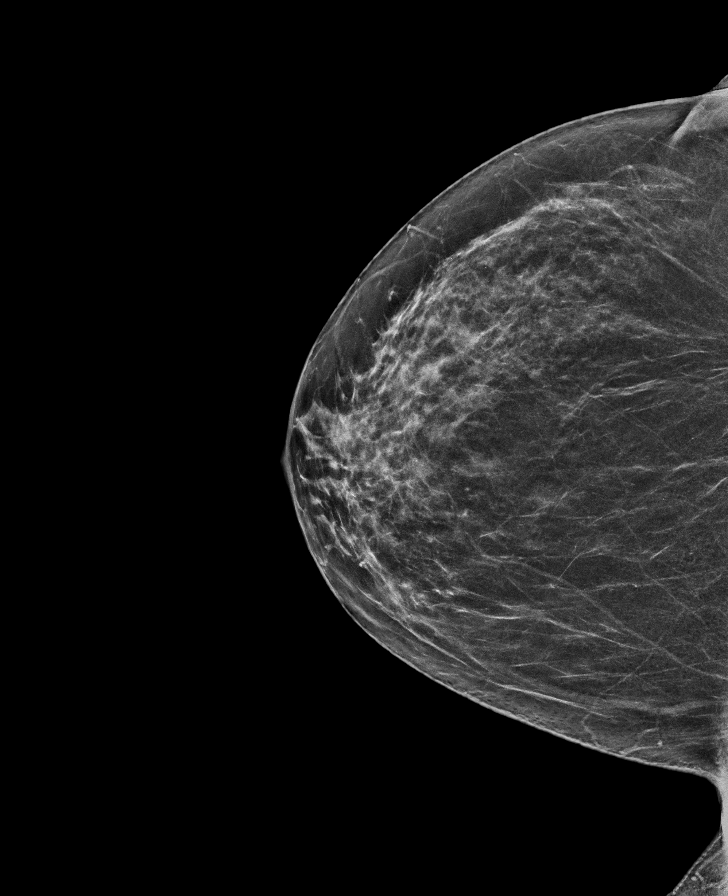

[L MLO tomo · tomo slice 35/69.0]
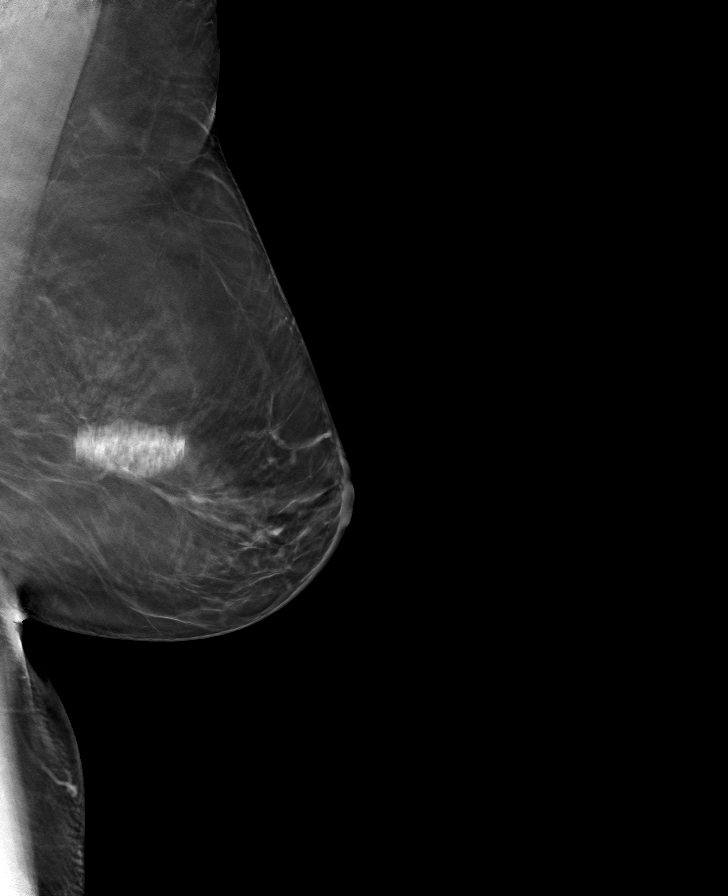

[R MLO tomo · tomo slice 36/71.0]
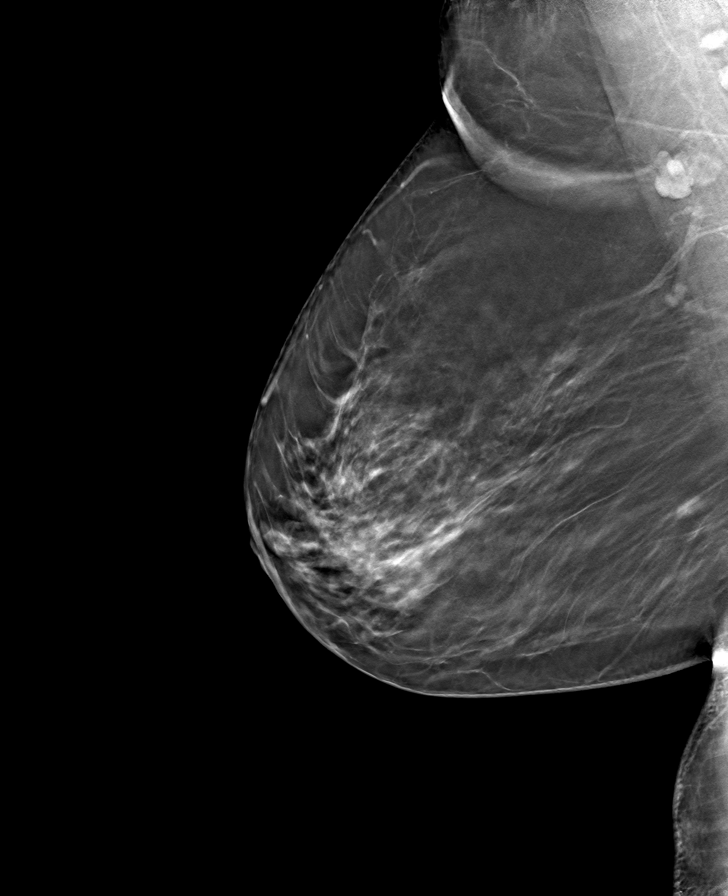

[R CC tomo · tomo slice 31/62.0]
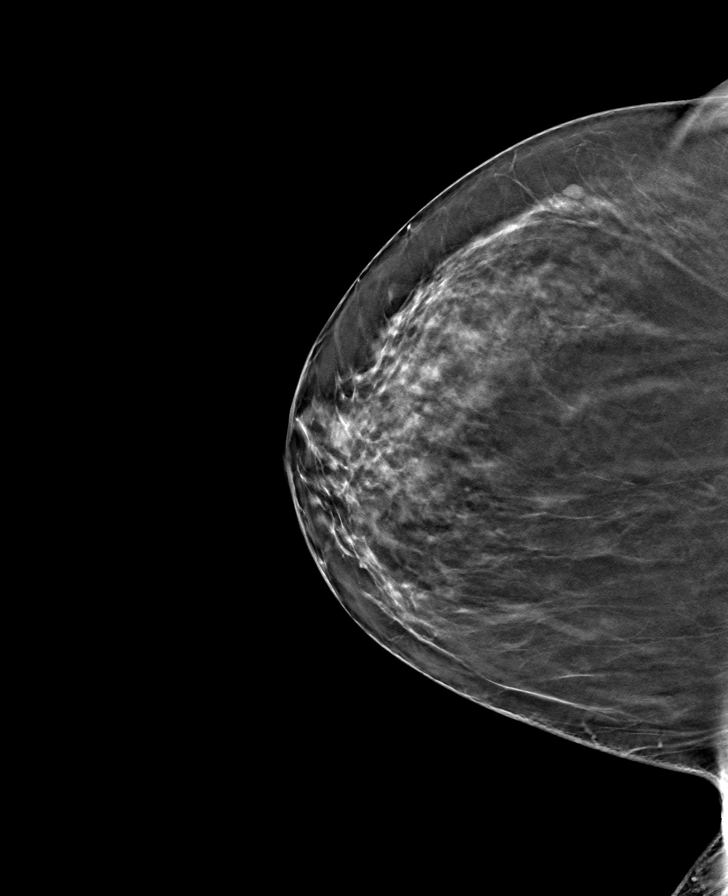

[L CC tomo · tomo slice 33/64.0]
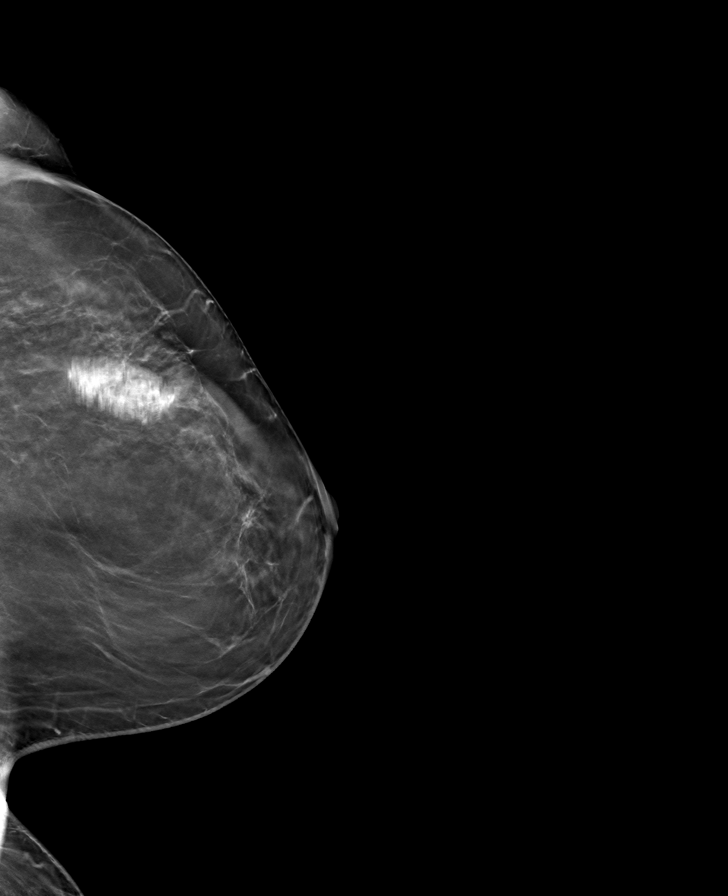

[8 of 24 positions shown; findings below may reference images not displayed]

ACR Breast Density Category b: There are scattered areas of
fibroglandular density.
FINDINGS: There are no findings suspicious for malignancy. Images were
processed with CAD.
IMPRESSION: No mammographic evidence of malignancy. A result letter of this
screening mammogram will be mailed directly to the patient.

RECOMMENDATION:
Screening mammogram in one year. (Code:[id number])

BI-RADS CATEGORY  1: Negative.

## 2022-01-24 DIAGNOSIS — J3089 Other allergic rhinitis: Secondary | ICD-10-CM | POA: Diagnosis not present

## 2022-01-24 DIAGNOSIS — J3081 Allergic rhinitis due to animal (cat) (dog) hair and dander: Secondary | ICD-10-CM | POA: Diagnosis not present

## 2022-01-24 DIAGNOSIS — J301 Allergic rhinitis due to pollen: Secondary | ICD-10-CM | POA: Diagnosis not present

## 2022-01-31 DIAGNOSIS — J301 Allergic rhinitis due to pollen: Secondary | ICD-10-CM | POA: Diagnosis not present

## 2022-01-31 DIAGNOSIS — J3089 Other allergic rhinitis: Secondary | ICD-10-CM | POA: Diagnosis not present

## 2022-01-31 DIAGNOSIS — J3081 Allergic rhinitis due to animal (cat) (dog) hair and dander: Secondary | ICD-10-CM | POA: Diagnosis not present

## 2022-02-07 DIAGNOSIS — J3081 Allergic rhinitis due to animal (cat) (dog) hair and dander: Secondary | ICD-10-CM | POA: Diagnosis not present

## 2022-02-07 DIAGNOSIS — J301 Allergic rhinitis due to pollen: Secondary | ICD-10-CM | POA: Diagnosis not present

## 2022-02-07 DIAGNOSIS — J3089 Other allergic rhinitis: Secondary | ICD-10-CM | POA: Diagnosis not present

## 2022-02-14 DIAGNOSIS — J301 Allergic rhinitis due to pollen: Secondary | ICD-10-CM | POA: Diagnosis not present

## 2022-02-14 DIAGNOSIS — J3081 Allergic rhinitis due to animal (cat) (dog) hair and dander: Secondary | ICD-10-CM | POA: Diagnosis not present

## 2022-02-14 DIAGNOSIS — J3089 Other allergic rhinitis: Secondary | ICD-10-CM | POA: Diagnosis not present

## 2022-02-21 DIAGNOSIS — J3081 Allergic rhinitis due to animal (cat) (dog) hair and dander: Secondary | ICD-10-CM | POA: Diagnosis not present

## 2022-02-21 DIAGNOSIS — J301 Allergic rhinitis due to pollen: Secondary | ICD-10-CM | POA: Diagnosis not present

## 2022-02-21 DIAGNOSIS — J3089 Other allergic rhinitis: Secondary | ICD-10-CM | POA: Diagnosis not present

## 2022-03-02 DIAGNOSIS — J3081 Allergic rhinitis due to animal (cat) (dog) hair and dander: Secondary | ICD-10-CM | POA: Diagnosis not present

## 2022-03-02 DIAGNOSIS — J301 Allergic rhinitis due to pollen: Secondary | ICD-10-CM | POA: Diagnosis not present

## 2022-03-02 DIAGNOSIS — J3089 Other allergic rhinitis: Secondary | ICD-10-CM | POA: Diagnosis not present

## 2022-03-07 DIAGNOSIS — J3081 Allergic rhinitis due to animal (cat) (dog) hair and dander: Secondary | ICD-10-CM | POA: Diagnosis not present

## 2022-03-07 DIAGNOSIS — J3089 Other allergic rhinitis: Secondary | ICD-10-CM | POA: Diagnosis not present

## 2022-03-07 DIAGNOSIS — J301 Allergic rhinitis due to pollen: Secondary | ICD-10-CM | POA: Diagnosis not present

## 2022-03-16 DIAGNOSIS — J301 Allergic rhinitis due to pollen: Secondary | ICD-10-CM | POA: Diagnosis not present

## 2022-03-16 DIAGNOSIS — J3089 Other allergic rhinitis: Secondary | ICD-10-CM | POA: Diagnosis not present

## 2022-03-16 DIAGNOSIS — J3081 Allergic rhinitis due to animal (cat) (dog) hair and dander: Secondary | ICD-10-CM | POA: Diagnosis not present

## 2022-03-23 DIAGNOSIS — J301 Allergic rhinitis due to pollen: Secondary | ICD-10-CM | POA: Diagnosis not present

## 2022-03-23 DIAGNOSIS — J3081 Allergic rhinitis due to animal (cat) (dog) hair and dander: Secondary | ICD-10-CM | POA: Diagnosis not present

## 2022-03-28 DIAGNOSIS — J3081 Allergic rhinitis due to animal (cat) (dog) hair and dander: Secondary | ICD-10-CM | POA: Diagnosis not present

## 2022-03-28 DIAGNOSIS — J301 Allergic rhinitis due to pollen: Secondary | ICD-10-CM | POA: Diagnosis not present

## 2022-03-28 DIAGNOSIS — J3089 Other allergic rhinitis: Secondary | ICD-10-CM | POA: Diagnosis not present

## 2022-03-29 DIAGNOSIS — J3081 Allergic rhinitis due to animal (cat) (dog) hair and dander: Secondary | ICD-10-CM | POA: Diagnosis not present

## 2022-03-29 DIAGNOSIS — J3089 Other allergic rhinitis: Secondary | ICD-10-CM | POA: Diagnosis not present

## 2022-03-29 DIAGNOSIS — J301 Allergic rhinitis due to pollen: Secondary | ICD-10-CM | POA: Diagnosis not present

## 2022-04-11 ENCOUNTER — Other Ambulatory Visit: Payer: Self-pay | Admitting: Nurse Practitioner

## 2022-04-13 DIAGNOSIS — J3089 Other allergic rhinitis: Secondary | ICD-10-CM | POA: Diagnosis not present

## 2022-04-13 DIAGNOSIS — J301 Allergic rhinitis due to pollen: Secondary | ICD-10-CM | POA: Diagnosis not present

## 2022-04-13 DIAGNOSIS — J3081 Allergic rhinitis due to animal (cat) (dog) hair and dander: Secondary | ICD-10-CM | POA: Diagnosis not present

## 2022-04-14 ENCOUNTER — Encounter: Payer: Self-pay | Admitting: Family Medicine

## 2022-04-14 ENCOUNTER — Other Ambulatory Visit: Payer: Self-pay

## 2022-04-14 MED ORDER — MONTELUKAST SODIUM 10 MG PO TABS: ORAL_TABLET | ORAL | 5 refills | Status: DC

## 2022-04-25 DIAGNOSIS — J301 Allergic rhinitis due to pollen: Secondary | ICD-10-CM | POA: Diagnosis not present

## 2022-04-25 DIAGNOSIS — J3081 Allergic rhinitis due to animal (cat) (dog) hair and dander: Secondary | ICD-10-CM | POA: Diagnosis not present

## 2022-04-25 DIAGNOSIS — J3089 Other allergic rhinitis: Secondary | ICD-10-CM | POA: Diagnosis not present

## 2022-05-02 DIAGNOSIS — J301 Allergic rhinitis due to pollen: Secondary | ICD-10-CM | POA: Diagnosis not present

## 2022-05-02 DIAGNOSIS — J3081 Allergic rhinitis due to animal (cat) (dog) hair and dander: Secondary | ICD-10-CM | POA: Diagnosis not present

## 2022-05-02 DIAGNOSIS — J3089 Other allergic rhinitis: Secondary | ICD-10-CM | POA: Diagnosis not present

## 2022-05-11 DIAGNOSIS — J3089 Other allergic rhinitis: Secondary | ICD-10-CM | POA: Diagnosis not present

## 2022-05-11 DIAGNOSIS — J3081 Allergic rhinitis due to animal (cat) (dog) hair and dander: Secondary | ICD-10-CM | POA: Diagnosis not present

## 2022-05-11 DIAGNOSIS — J301 Allergic rhinitis due to pollen: Secondary | ICD-10-CM | POA: Diagnosis not present

## 2022-05-16 DIAGNOSIS — J301 Allergic rhinitis due to pollen: Secondary | ICD-10-CM | POA: Diagnosis not present

## 2022-05-16 DIAGNOSIS — J3089 Other allergic rhinitis: Secondary | ICD-10-CM | POA: Diagnosis not present

## 2022-05-16 DIAGNOSIS — J3081 Allergic rhinitis due to animal (cat) (dog) hair and dander: Secondary | ICD-10-CM | POA: Diagnosis not present

## 2022-05-23 DIAGNOSIS — J3089 Other allergic rhinitis: Secondary | ICD-10-CM | POA: Diagnosis not present

## 2022-05-23 DIAGNOSIS — J301 Allergic rhinitis due to pollen: Secondary | ICD-10-CM | POA: Diagnosis not present

## 2022-05-23 DIAGNOSIS — J3081 Allergic rhinitis due to animal (cat) (dog) hair and dander: Secondary | ICD-10-CM | POA: Diagnosis not present

## 2022-06-01 DIAGNOSIS — J301 Allergic rhinitis due to pollen: Secondary | ICD-10-CM | POA: Diagnosis not present

## 2022-06-01 DIAGNOSIS — J3081 Allergic rhinitis due to animal (cat) (dog) hair and dander: Secondary | ICD-10-CM | POA: Diagnosis not present

## 2022-06-01 DIAGNOSIS — J3089 Other allergic rhinitis: Secondary | ICD-10-CM | POA: Diagnosis not present

## 2022-06-08 DIAGNOSIS — J3089 Other allergic rhinitis: Secondary | ICD-10-CM | POA: Diagnosis not present

## 2022-06-08 DIAGNOSIS — J301 Allergic rhinitis due to pollen: Secondary | ICD-10-CM | POA: Diagnosis not present

## 2022-06-08 DIAGNOSIS — J3081 Allergic rhinitis due to animal (cat) (dog) hair and dander: Secondary | ICD-10-CM | POA: Diagnosis not present

## 2022-06-27 DIAGNOSIS — J3081 Allergic rhinitis due to animal (cat) (dog) hair and dander: Secondary | ICD-10-CM | POA: Diagnosis not present

## 2022-06-27 DIAGNOSIS — J301 Allergic rhinitis due to pollen: Secondary | ICD-10-CM | POA: Diagnosis not present

## 2022-06-27 DIAGNOSIS — J3089 Other allergic rhinitis: Secondary | ICD-10-CM | POA: Diagnosis not present

## 2022-07-04 DIAGNOSIS — J3081 Allergic rhinitis due to animal (cat) (dog) hair and dander: Secondary | ICD-10-CM | POA: Diagnosis not present

## 2022-07-04 DIAGNOSIS — J301 Allergic rhinitis due to pollen: Secondary | ICD-10-CM | POA: Diagnosis not present

## 2022-07-04 DIAGNOSIS — J3089 Other allergic rhinitis: Secondary | ICD-10-CM | POA: Diagnosis not present

## 2022-07-11 DIAGNOSIS — J3089 Other allergic rhinitis: Secondary | ICD-10-CM | POA: Diagnosis not present

## 2022-07-11 DIAGNOSIS — J3081 Allergic rhinitis due to animal (cat) (dog) hair and dander: Secondary | ICD-10-CM | POA: Diagnosis not present

## 2022-07-11 DIAGNOSIS — J301 Allergic rhinitis due to pollen: Secondary | ICD-10-CM | POA: Diagnosis not present

## 2022-07-12 ENCOUNTER — Other Ambulatory Visit: Payer: PPO

## 2022-07-12 DIAGNOSIS — E782 Mixed hyperlipidemia: Secondary | ICD-10-CM | POA: Diagnosis not present

## 2022-07-12 DIAGNOSIS — R7303 Prediabetes: Secondary | ICD-10-CM | POA: Diagnosis not present

## 2022-07-13 LAB — HEMOGLOBIN A1C
Hgb A1c MFr Bld: 6 % of total Hgb — ABNORMAL HIGH (ref <5.7)
Mean Plasma Glucose: 126 mg/dL
eAG (mmol/L): 7 mmol/L

## 2022-07-13 LAB — LIPID PANEL
Cholesterol: 209 mg/dL — ABNORMAL HIGH (ref <200)
HDL: 62 mg/dL (ref >=50)
LDL Cholesterol (Calc): 125 mg/dL (calc) — ABNORMAL HIGH
Non-HDL Cholesterol (Calc): 147 mg/dL (calc) — ABNORMAL HIGH (ref <130)
Total CHOL/HDL Ratio: 3.4 (calc) (ref <5.0)
Triglycerides: 114 mg/dL (ref <150)

## 2022-07-18 DIAGNOSIS — J301 Allergic rhinitis due to pollen: Secondary | ICD-10-CM | POA: Diagnosis not present

## 2022-07-18 DIAGNOSIS — J3081 Allergic rhinitis due to animal (cat) (dog) hair and dander: Secondary | ICD-10-CM | POA: Diagnosis not present

## 2022-07-18 DIAGNOSIS — J4530 Mild persistent asthma, uncomplicated: Secondary | ICD-10-CM | POA: Diagnosis not present

## 2022-07-18 DIAGNOSIS — J3089 Other allergic rhinitis: Secondary | ICD-10-CM | POA: Diagnosis not present

## 2022-07-19 ENCOUNTER — Ambulatory Visit (INDEPENDENT_AMBULATORY_CARE_PROVIDER_SITE_OTHER): Payer: PPO | Admitting: Family Medicine

## 2022-07-19 ENCOUNTER — Other Ambulatory Visit: Payer: Self-pay | Admitting: Family Medicine

## 2022-07-19 ENCOUNTER — Encounter: Payer: Self-pay | Admitting: Family Medicine

## 2022-07-19 VITALS — BP 134/84 | HR 78 | Ht 60.0 in | Wt 153.0 lb

## 2022-07-19 DIAGNOSIS — R7303 Prediabetes: Secondary | ICD-10-CM

## 2022-07-19 DIAGNOSIS — I10 Essential (primary) hypertension: Secondary | ICD-10-CM

## 2022-07-19 DIAGNOSIS — Z1231 Encounter for screening mammogram for malignant neoplasm of breast: Secondary | ICD-10-CM | POA: Diagnosis not present

## 2022-07-19 DIAGNOSIS — Z0000 Encounter for general adult medical examination without abnormal findings: Secondary | ICD-10-CM

## 2022-07-19 DIAGNOSIS — K439 Ventral hernia without obstruction or gangrene: Secondary | ICD-10-CM | POA: Diagnosis not present

## 2022-07-19 DIAGNOSIS — E782 Mixed hyperlipidemia: Secondary | ICD-10-CM

## 2022-07-19 DIAGNOSIS — E663 Overweight: Secondary | ICD-10-CM | POA: Diagnosis not present

## 2022-07-19 NOTE — Assessment & Plan Note (Signed)
Improved BP control Home BP cuff calibrated Followed by Cardiology [facility] Renal artery dopplers negative No other underlying cause of HYPERTENSION identified OFF Lisinopril, HCTZ, Telmisartan   Plan:  1. Continue current BP regimen -  - Amlodipine 2.5mg  BID - Carvedilol 25mg  TWICE A DAY - Chlorthalidone 12.5mg  (half of 25mg )  2. Encourage improved lifestyle - low sodium diet, regular exercise 3. Continue monitor BP outside office, bring readings to next visit, if persistently >140/90 or new symptoms notify office sooner

## 2022-07-19 NOTE — Progress Notes (Signed)
Subjective:    Patient ID: Stacie Ellis, female    DOB: [birth year], 67 y.o.   MRN: [id number]  Stacie Ellis is a 67 y.o. female presenting on 07/19/2022 for Medical Management of Chronic Issues and Hernia (Present 4 years, uses a band at times)   HPI  HYPERLIPIDEMIA: - Reports no concerns, she has improved lifestyle diet and reduced fried foods and reduced eggs. Last lipid panel repeat 07/2022 - LDL down to 125 from 143, total down from 227 to 209, HDL 62, and TG up a little to 114, but stable. - Currently taking no medication, failed Rosuvastatin 5mg  and Atorvastatin 10mg  previously Lifestyle - Diet: goal to improve cholesterol    Pre-Diabetes Last A1c 6.0, and repeat reading also 6.0 Prior range 5.6 to 5.9 She has strong fam history of DIABETES Admits higher carb. Weight loss 10 lbs in 1 year.  Ventral Hernia History of partial colectomy due to pre-cancerous polyps. Done 2008 She has developed ventral hernia at surgical site over past 4 years, gradual enlarging >4-5 years Prior surgeon Dr Adela Glimpse, she has evaluated it with him in past 5 years and advised it was maybe quarter sized and consider repair. She has paused on this and now says it has enlarged closer to "lemon sized" Not painful, but feels uncomfortable at times Admits increasing bulging Not impacting her bowels Questioning future hernia repair   PMH GERD / Hoarseness of Voice On PPI Pantoprazole 40mg  daily, has some breakthrough symptoms.   CHRONIC HTN: Followed by [facility] Has done well with med adjustments overall Prior work up and eval done. She has been on BP medication since age 24s She has significant fam history of father and older brother with HTN diagnosed in their 31s. Her BP cuff similar to our readings  Current Meds - Amlodipine 2.5mg  BID - Carvedilol 25mg  TWICE A DAY - Chlorthalidone 12.5mg  (half of 25mg ) OFF Lisinopril, HCTZ, Telmisartan     Reports good compliance, took meds today. Tolerating well, w/o complaints. Denies CP, dyspnea, HA, edema, dizziness / lightheadedness   Health Maintenance: Due mammogram discussed scheduling.  Past Surgical History:  Procedure Laterality Date   APPENDECTOMY  2008   BREAST BIOPSY Left 2011   pt states had stereo done on mobile unit at Dr. Rutherford Nail office. DCIS   BREAST LUMPECTOMY Left 02/2010   lumpectomy with re excision 03/2010 of left breast for cancer and rad tx   BREAST SURGERY Left 02/17/2010;03/10/2010   lumpectomy and repeat wide excision   CESAREAN SECTION  1998   COLON SURGERY Right 06/13/2006   Hand-assisted right hemicolectomy. 5.8 cm villous adenoma without atypia   COLONOSCOPY  2015   Normal exam.   COLONOSCOPY WITH PROPOFOL N/A 05/29/2019   Procedure: COLONOSCOPY WITH PROPOFOL;  Surgeon: Midge Minium, MD;  Location: [facility] ENDOSCOPY;  Service: Endoscopy;  Laterality: N/A;   ESOPHAGOGASTRODUODENOSCOPY (EGD) WITH PROPOFOL N/A 06/21/2015   Procedure: ESOPHAGOGASTRODUODENOSCOPY (EGD) WITH PROPOFOL;  Surgeon: Wallace Cullens, MD;  Location: [facility] ENDOSCOPY;  Service: Gastroenterology;  Laterality: N/A;        12/09/2021   11:42 AM 08/04/2021   10:53 AM 02/07/2021    1:58 PM  Depression screen PHQ 2/9  Decreased Interest 0 0 0  Down, Depressed, Hopeless 0 0 0  PHQ - 2 Score 0 0 0  Altered sleeping 0    Tired, decreased energy 0    Change in appetite 0    Feeling bad or failure about  yourself  0    Trouble concentrating 0    Moving slowly or fidgety/restless 0    Suicidal thoughts 0    PHQ-9 Score 0    Difficult doing work/chores Not difficult at all      Social History   Tobacco Use   Smoking status: Never   Smokeless tobacco: Never  Vaping Use   Vaping Use: Never used  Substance Use Topics   Alcohol use: No   Drug use: No    Review of Systems Per HPI unless specifically indicated above     Objective:    BP 134/84   Pulse 78   Ht 5' (1.524 m)   Wt 153  lb (69.4 kg)   SpO2 96%   BMI 29.88 kg/m   Wt Readings from Last 3 Encounters:  07/19/22 153 lb (69.4 kg)  01/16/22 152 lb 6.4 oz (69.1 kg)  12/09/21 150 lb (68 kg)    Physical Exam Vitals and nursing note reviewed.  Constitutional:      General: She is not in acute distress.    Appearance: Normal appearance. She is well-developed. She is not diaphoretic.     Comments: Well-appearing, comfortable, cooperative  HENT:     Head: Normocephalic and atraumatic.  Eyes:     General:        Right eye: No discharge.        Left eye: No discharge.     Conjunctiva/sclera: Conjunctivae normal.  Neck:     Thyroid: No thyromegaly.  Cardiovascular:     Rate and Rhythm: Normal rate and regular rhythm.     Heart sounds: Normal heart sounds. No murmur heard. Pulmonary:     Effort: Pulmonary effort is normal. No respiratory distress.     Breath sounds: Normal breath sounds. No wheezing or rales.  Abdominal:     Hernia: A hernia (moderate sized 3-5 cm hernia defect L anterior abdominal wall adjacent to umbilicus, provoked with valsalva and also laying supine and sitting up) is present.  Musculoskeletal:        General: Normal range of motion.     Cervical back: Normal range of motion and neck supple.  Lymphadenopathy:     Cervical: No cervical adenopathy.  Skin:    General: Skin is warm and dry.     Findings: No erythema or rash.  Neurological:     Mental Status: She is alert and oriented to person, place, and time.  Psychiatric:        Mood and Affect: Mood normal.        Behavior: Behavior normal.        Thought Content: Thought content normal.     Comments: Well groomed, good eye contact, normal speech and thoughts      Results for orders placed or performed in visit on 07/12/22  Hemoglobin A1c  Result Value Ref Range   Hgb A1c MFr Bld 6.0 (H) <5.7 % of total Hgb   Mean Plasma Glucose 126 mg/dL   eAG (mmol/L) 7.0 mmol/L  Lipid panel  Result Value Ref Range   Cholesterol 209  (H) <200 mg/dL   HDL 62 > OR = 50 mg/dL   Triglycerides 161 <096 mg/dL   LDL Cholesterol (Calc) 125 (H) mg/dL (calc)   Total CHOL/HDL Ratio 3.4 <5.0 (calc)   Non-HDL Cholesterol (Calc) 147 (H) <130 mg/dL (calc)      Assessment & Plan:   Problem List Items Addressed This Visit     Essential hypertension  Improved BP control Home BP cuff calibrated Followed by [facility] Renal artery dopplers negative No other underlying cause of HYPERTENSION identified OFF Lisinopril, HCTZ, Telmisartan   Plan:  1. Continue current BP regimen -  - Amlodipine 2.5mg  BID - Carvedilol 25mg  TWICE A DAY - Chlorthalidone 12.5mg  (half of 25mg )  2. Encourage improved lifestyle - low sodium diet, regular exercise 3. Continue monitor BP outside office, bring readings to next visit, if persistently >140/90 or new symptoms notify office sooner      Mixed hyperlipidemia    Improved LDL to 125, from prior >140 Last lipid panel 07/2022 The 10-year ASCVD risk score (Arnett DK, et al., 2019) is: 11% Failed Rosuvastatin 5mg  and Atorvastatin 10mg  - myalgia  Plan: Defer med today, previously discussed Statins, Zetia, PCSK9 Encourage improved lifestyle - low carb/cholesterol, reduce portion size, continue improving regular exercise  F/u labs lipid 6 mo      Overweight (BMI 25.0-29.9)   Pre-diabetes - Primary    Mild elevated A1c to 6.0, stable from prior Fam history diabetes  Plan:  1. Not on any therapy currently  2. Encourage improved lifestyle - low carb, low sugar diet, reduce portion size, continue improving regular exercise 3. Follow-up 6 mo with labs       Ventral hernia without obstruction or gangrene   Other Visit Diagnoses     Encounter for screening mammogram for malignant neoplasm of breast       Relevant Orders   MM 3D SCREENING MAMMOGRAM BILATERAL BREAST       Ventral Abdominal Hernia Gradual chronic progression Seems to be surgical incisional  hernia, from 2008 initial surgery Now 16 years later seems to be worsening in past few years It is reducible and larger area, limited symptoms but noticeable We discussed hernia precautions and when to seek treatment urgently if needed Keep conservative care with binder/truss to support if heavy lifting Okay to do surveillance observation still, future reconsider repair when ready, consultation can be arranged w/ Surgery.  No orders of the defined types were placed in this encounter.   Orders Placed This Encounter  Procedures   MM 3D SCREENING MAMMOGRAM BILATERAL BREAST    Standing Status:   Future    Standing Expiration Date:   07/19/2023    Order Specific Question:   Reason for Exam (SYMPTOM  OR DIAGNOSIS REQUIRED)    Answer:   Screening bilateral 3D Mammogram Tomo    Order Specific Question:   Preferred imaging location?    Answer:   [facility]      Follow up plan: Return in about 6 months (around 01/19/2023) for 6 month fasting lab only then 1 week later Annual Physical.  Future labs ordered for 01/24/23  Saralyn Pilar, DO [facility] 07/19/2022, 10:37 AM

## 2022-07-19 NOTE — Assessment & Plan Note (Signed)
Mild elevated A1c to 6.0, stable from prior Fam history diabetes  Plan:  1. Not on any therapy currently  2. Encourage improved lifestyle - low carb, low sugar diet, reduce portion size, continue improving regular exercise 3. Follow-up 6 mo with labs

## 2022-07-19 NOTE — Patient Instructions (Addendum)
Thank you for coming to the office today.  For Mammogram screening for breast cancer   Call the Imaging Center below anytime to schedule your own appointment now that order has been placed.  [facility] [address] [place], [place] [address] Phone: [phone]   Recent Labs    01/11/22 0859 07/12/22 0921  HGBA1C 6.0* 6.0*   Keep up current plans on diet and lifestyle, repeat in 6 months.  Lipid panel improved cholesterol.  ----------------------  Okay to defer the Ventral / Surgical Hernia right now. We discussed scenario with monitoring, it is not urgent.   Okay to use the binder/truss support if heavy lifting and active  If bothering you more or inc pain or inc size significantly let me know sooner.    If significant worsening pain or you get bulging that does NOT go down or go away and CANNOT push back in, or nausea, vomiting, then it is very important to go directly to hospital ED for more immediate evaluation, as this can be a life threatening surgical emergency   Please schedule a Follow-up Appointment to: Return in about 6 months (around 01/19/2023) for 6 month fasting lab only then 1 week later Annual Physical.  If you have any other questions or concerns, please feel free to call the office or send a message through MyChart. You may also schedule an earlier appointment if necessary.  Additionally, you may be receiving a survey about your experience at our office within a few days to 1 week by e-mail or mail. We value your feedback.  Saralyn Pilar, DO [facility], [place]

## 2022-07-19 NOTE — Assessment & Plan Note (Signed)
Improved LDL to 125, from prior >140 Last lipid panel 07/2022 The 10-year ASCVD risk score (Arnett DK, et al., 2019) is: 11% Failed Rosuvastatin 5mg  and Atorvastatin 10mg  - myalgia  Plan: Defer med today, previously discussed Statins, Zetia, PCSK9 Encourage improved lifestyle - low carb/cholesterol, reduce portion size, continue improving regular exercise  F/u labs lipid 6 mo

## 2022-07-27 DIAGNOSIS — J3081 Allergic rhinitis due to animal (cat) (dog) hair and dander: Secondary | ICD-10-CM | POA: Diagnosis not present

## 2022-07-27 DIAGNOSIS — J3089 Other allergic rhinitis: Secondary | ICD-10-CM | POA: Diagnosis not present

## 2022-07-27 DIAGNOSIS — J301 Allergic rhinitis due to pollen: Secondary | ICD-10-CM | POA: Diagnosis not present

## 2022-08-01 DIAGNOSIS — J301 Allergic rhinitis due to pollen: Secondary | ICD-10-CM | POA: Diagnosis not present

## 2022-08-01 DIAGNOSIS — J3081 Allergic rhinitis due to animal (cat) (dog) hair and dander: Secondary | ICD-10-CM | POA: Diagnosis not present

## 2022-08-01 DIAGNOSIS — J3089 Other allergic rhinitis: Secondary | ICD-10-CM | POA: Diagnosis not present

## 2022-08-09 ENCOUNTER — Other Ambulatory Visit (HOSPITAL_BASED_OUTPATIENT_CLINIC_OR_DEPARTMENT_OTHER): Payer: Self-pay | Admitting: Family

## 2022-08-09 ENCOUNTER — Other Ambulatory Visit (HOSPITAL_BASED_OUTPATIENT_CLINIC_OR_DEPARTMENT_OTHER): Payer: Self-pay | Admitting: Cardiovascular Disease

## 2022-08-09 DIAGNOSIS — I10 Essential (primary) hypertension: Secondary | ICD-10-CM

## 2022-08-09 NOTE — Telephone Encounter (Signed)
Please call pt to schedule overdue HTN Clinic f/u with Gillian Shields, NP or Dr. Duke Salvia. Pt needing refills. Thank you!

## 2022-08-09 NOTE — Telephone Encounter (Signed)
Message sent to Damaris Schooner to schedule overdue HTN Clinic appointment for refills.

## 2022-08-10 DIAGNOSIS — J301 Allergic rhinitis due to pollen: Secondary | ICD-10-CM | POA: Diagnosis not present

## 2022-08-10 DIAGNOSIS — J3089 Other allergic rhinitis: Secondary | ICD-10-CM | POA: Diagnosis not present

## 2022-08-10 DIAGNOSIS — J3081 Allergic rhinitis due to animal (cat) (dog) hair and dander: Secondary | ICD-10-CM | POA: Diagnosis not present

## 2022-08-11 DIAGNOSIS — K219 Gastro-esophageal reflux disease without esophagitis: Secondary | ICD-10-CM | POA: Diagnosis not present

## 2022-08-11 DIAGNOSIS — Z6831 Body mass index (BMI) 31.0-31.9, adult: Secondary | ICD-10-CM | POA: Diagnosis not present

## 2022-08-11 DIAGNOSIS — E063 Autoimmune thyroiditis: Secondary | ICD-10-CM | POA: Diagnosis not present

## 2022-08-11 DIAGNOSIS — L209 Atopic dermatitis, unspecified: Secondary | ICD-10-CM | POA: Diagnosis not present

## 2022-08-11 DIAGNOSIS — E042 Nontoxic multinodular goiter: Secondary | ICD-10-CM | POA: Diagnosis not present

## 2022-08-11 DIAGNOSIS — E6609 Other obesity due to excess calories: Secondary | ICD-10-CM | POA: Diagnosis not present

## 2022-08-11 DIAGNOSIS — I493 Ventricular premature depolarization: Secondary | ICD-10-CM | POA: Diagnosis not present

## 2022-08-11 DIAGNOSIS — I10 Essential (primary) hypertension: Secondary | ICD-10-CM | POA: Diagnosis not present

## 2022-08-11 DIAGNOSIS — C50919 Malignant neoplasm of unspecified site of unspecified female breast: Secondary | ICD-10-CM | POA: Diagnosis not present

## 2022-08-11 DIAGNOSIS — F064 Anxiety disorder due to known physiological condition: Secondary | ICD-10-CM | POA: Diagnosis not present

## 2022-08-11 NOTE — Telephone Encounter (Signed)
Could you assist with this please?

## 2022-08-14 NOTE — Telephone Encounter (Signed)
LVM for patient to schedule HTN Clinic f/u 

## 2022-08-17 DIAGNOSIS — J301 Allergic rhinitis due to pollen: Secondary | ICD-10-CM | POA: Diagnosis not present

## 2022-08-17 DIAGNOSIS — J3081 Allergic rhinitis due to animal (cat) (dog) hair and dander: Secondary | ICD-10-CM | POA: Diagnosis not present

## 2022-08-17 DIAGNOSIS — J3089 Other allergic rhinitis: Secondary | ICD-10-CM | POA: Diagnosis not present

## 2022-08-23 NOTE — Telephone Encounter (Signed)
LVM for patient to schedule HTN Clinic f/u 

## 2022-08-29 DIAGNOSIS — J3081 Allergic rhinitis due to animal (cat) (dog) hair and dander: Secondary | ICD-10-CM | POA: Diagnosis not present

## 2022-08-29 DIAGNOSIS — J3089 Other allergic rhinitis: Secondary | ICD-10-CM | POA: Diagnosis not present

## 2022-08-29 DIAGNOSIS — J301 Allergic rhinitis due to pollen: Secondary | ICD-10-CM | POA: Diagnosis not present

## 2022-09-05 ENCOUNTER — Ambulatory Visit
Admission: RE | Admit: 2022-09-05 | Discharge: 2022-09-05 | Disposition: A | Discharge status: Home / Self Care | Payer: PPO | Source: Ambulatory Visit | Attending: Family Medicine | Admitting: Family Medicine

## 2022-09-05 DIAGNOSIS — Z1231 Encounter for screening mammogram for malignant neoplasm of breast: Secondary | ICD-10-CM | POA: Diagnosis not present

## 2022-09-05 DIAGNOSIS — J301 Allergic rhinitis due to pollen: Secondary | ICD-10-CM | POA: Diagnosis not present

## 2022-09-05 DIAGNOSIS — J3089 Other allergic rhinitis: Secondary | ICD-10-CM | POA: Diagnosis not present

## 2022-09-05 DIAGNOSIS — Z853 Personal history of malignant neoplasm of breast: Secondary | ICD-10-CM | POA: Diagnosis not present

## 2022-09-08 ENCOUNTER — Other Ambulatory Visit (HOSPITAL_BASED_OUTPATIENT_CLINIC_OR_DEPARTMENT_OTHER): Payer: Self-pay | Admitting: Cardiovascular Disease

## 2022-09-12 DIAGNOSIS — I10 Essential (primary) hypertension: Secondary | ICD-10-CM | POA: Diagnosis not present

## 2022-09-12 DIAGNOSIS — I493 Ventricular premature depolarization: Secondary | ICD-10-CM | POA: Diagnosis not present

## 2022-09-12 DIAGNOSIS — E042 Nontoxic multinodular goiter: Secondary | ICD-10-CM | POA: Diagnosis not present

## 2022-09-12 DIAGNOSIS — C50919 Malignant neoplasm of unspecified site of unspecified female breast: Secondary | ICD-10-CM | POA: Diagnosis not present

## 2022-09-12 DIAGNOSIS — E6609 Other obesity due to excess calories: Secondary | ICD-10-CM | POA: Diagnosis not present

## 2022-09-12 DIAGNOSIS — Z6831 Body mass index (BMI) 31.0-31.9, adult: Secondary | ICD-10-CM | POA: Diagnosis not present

## 2022-09-12 DIAGNOSIS — E063 Autoimmune thyroiditis: Secondary | ICD-10-CM | POA: Diagnosis not present

## 2022-09-12 DIAGNOSIS — K219 Gastro-esophageal reflux disease without esophagitis: Secondary | ICD-10-CM | POA: Diagnosis not present

## 2022-09-12 DIAGNOSIS — F064 Anxiety disorder due to known physiological condition: Secondary | ICD-10-CM | POA: Diagnosis not present

## 2022-09-12 DIAGNOSIS — J3081 Allergic rhinitis due to animal (cat) (dog) hair and dander: Secondary | ICD-10-CM | POA: Diagnosis not present

## 2022-09-12 DIAGNOSIS — J3089 Other allergic rhinitis: Secondary | ICD-10-CM | POA: Diagnosis not present

## 2022-09-12 DIAGNOSIS — L209 Atopic dermatitis, unspecified: Secondary | ICD-10-CM | POA: Diagnosis not present

## 2022-09-12 DIAGNOSIS — J301 Allergic rhinitis due to pollen: Secondary | ICD-10-CM | POA: Diagnosis not present

## 2022-09-13 DIAGNOSIS — J3081 Allergic rhinitis due to animal (cat) (dog) hair and dander: Secondary | ICD-10-CM | POA: Diagnosis not present

## 2022-09-26 DIAGNOSIS — J3089 Other allergic rhinitis: Secondary | ICD-10-CM | POA: Diagnosis not present

## 2022-09-26 DIAGNOSIS — J301 Allergic rhinitis due to pollen: Secondary | ICD-10-CM | POA: Diagnosis not present

## 2022-09-26 DIAGNOSIS — J3081 Allergic rhinitis due to animal (cat) (dog) hair and dander: Secondary | ICD-10-CM | POA: Diagnosis not present

## 2022-10-08 ENCOUNTER — Other Ambulatory Visit: Payer: Self-pay | Admitting: Family Medicine

## 2022-10-08 DIAGNOSIS — I10 Essential (primary) hypertension: Secondary | ICD-10-CM

## 2022-10-08 DIAGNOSIS — R7303 Prediabetes: Secondary | ICD-10-CM

## 2022-10-08 DIAGNOSIS — E559 Vitamin D deficiency, unspecified: Secondary | ICD-10-CM

## 2022-10-08 DIAGNOSIS — E782 Mixed hyperlipidemia: Secondary | ICD-10-CM

## 2022-10-08 DIAGNOSIS — K219 Gastro-esophageal reflux disease without esophagitis: Secondary | ICD-10-CM

## 2022-10-08 DIAGNOSIS — Z9049 Acquired absence of other specified parts of digestive tract: Secondary | ICD-10-CM

## 2022-10-08 DIAGNOSIS — E538 Deficiency of other specified B group vitamins: Secondary | ICD-10-CM

## 2022-10-08 DIAGNOSIS — R300 Dysuria: Secondary | ICD-10-CM

## 2022-10-08 DIAGNOSIS — J4520 Mild intermittent asthma, uncomplicated: Secondary | ICD-10-CM

## 2022-10-08 DIAGNOSIS — Z0001 Encounter for general adult medical examination with abnormal findings: Secondary | ICD-10-CM

## 2022-10-08 DIAGNOSIS — R5383 Other fatigue: Secondary | ICD-10-CM

## 2022-10-09 NOTE — Telephone Encounter (Signed)
Requested Prescriptions  Pending Prescriptions Disp Refills   amLODipine (NORVASC) 2.5 MG tablet [Pharmacy Med Name: AMLODIPINE BESYLATE 2.5MG  TABLETS] 180 tablet 1    Sig: TAKE 1 TABLET(2.5 MG) BY MOUTH TWICE DAILY     Cardiovascular: Calcium Channel Blockers 2 Passed - 10/08/2022  7:07 AM      Passed - Last BP in normal range    BP Readings from Last 1 Encounters:  07/19/22 134/84         Passed - Last Heart Rate in normal range    Pulse Readings from Last 1 Encounters:  07/19/22 78         Passed - Valid encounter within last 6 months    Recent Outpatient Visits           2 months ago Pre-diabetes   [place] [facility] [place], Netta Neat, DO   8 months ago Annual physical exam   [place] [facility] Smitty Cords, DO   1 year ago Essential hypertension   [facility] Althea Charon, Netta Neat, DO       Future Appointments             In 3 months Althea Charon, Netta Neat, DO [place] [facility]             montelukast (SINGULAIR) 10 MG tablet [Pharmacy Med Name: MONTELUKAST 10MG  TABLETS] 90 tablet 2    Sig: TAKE 1 TABLET(10 MG) BY MOUTH AT BEDTIME     Pulmonology:  Leukotriene Inhibitors Passed - 10/08/2022  7:07 AM      Passed - Valid encounter within last 12 months    Recent Outpatient Visits           2 months ago Pre-diabetes   [place] [facility] [place], Netta Neat, DO   8 months ago Annual physical exam   [place] [facility] Smitty Cords, DO   1 year ago Essential hypertension   [place] [facility] [place], Netta Neat, DO       Future Appointments             In 3 months Althea Charon, Netta Neat, DO [place] [facility], [facility]

## 2022-10-10 DIAGNOSIS — J301 Allergic rhinitis due to pollen: Secondary | ICD-10-CM | POA: Diagnosis not present

## 2022-10-10 DIAGNOSIS — J3081 Allergic rhinitis due to animal (cat) (dog) hair and dander: Secondary | ICD-10-CM | POA: Diagnosis not present

## 2022-10-10 DIAGNOSIS — J3089 Other allergic rhinitis: Secondary | ICD-10-CM | POA: Diagnosis not present

## 2022-10-24 DIAGNOSIS — J3081 Allergic rhinitis due to animal (cat) (dog) hair and dander: Secondary | ICD-10-CM | POA: Diagnosis not present

## 2022-10-24 DIAGNOSIS — J301 Allergic rhinitis due to pollen: Secondary | ICD-10-CM | POA: Diagnosis not present

## 2022-10-24 DIAGNOSIS — J3089 Other allergic rhinitis: Secondary | ICD-10-CM | POA: Diagnosis not present

## 2022-10-31 DIAGNOSIS — J301 Allergic rhinitis due to pollen: Secondary | ICD-10-CM | POA: Diagnosis not present

## 2022-10-31 DIAGNOSIS — J3081 Allergic rhinitis due to animal (cat) (dog) hair and dander: Secondary | ICD-10-CM | POA: Diagnosis not present

## 2022-10-31 DIAGNOSIS — J3089 Other allergic rhinitis: Secondary | ICD-10-CM | POA: Diagnosis not present

## 2022-11-14 DIAGNOSIS — J3081 Allergic rhinitis due to animal (cat) (dog) hair and dander: Secondary | ICD-10-CM | POA: Diagnosis not present

## 2022-11-14 DIAGNOSIS — J3089 Other allergic rhinitis: Secondary | ICD-10-CM | POA: Diagnosis not present

## 2022-11-14 DIAGNOSIS — J301 Allergic rhinitis due to pollen: Secondary | ICD-10-CM | POA: Diagnosis not present

## 2022-11-28 DIAGNOSIS — J3081 Allergic rhinitis due to animal (cat) (dog) hair and dander: Secondary | ICD-10-CM | POA: Diagnosis not present

## 2022-11-28 DIAGNOSIS — J3089 Other allergic rhinitis: Secondary | ICD-10-CM | POA: Diagnosis not present

## 2022-11-28 DIAGNOSIS — J301 Allergic rhinitis due to pollen: Secondary | ICD-10-CM | POA: Diagnosis not present

## 2022-12-05 DIAGNOSIS — J3081 Allergic rhinitis due to animal (cat) (dog) hair and dander: Secondary | ICD-10-CM | POA: Diagnosis not present

## 2022-12-05 DIAGNOSIS — J3089 Other allergic rhinitis: Secondary | ICD-10-CM | POA: Diagnosis not present

## 2022-12-05 DIAGNOSIS — J301 Allergic rhinitis due to pollen: Secondary | ICD-10-CM | POA: Diagnosis not present

## 2022-12-12 DIAGNOSIS — J301 Allergic rhinitis due to pollen: Secondary | ICD-10-CM | POA: Diagnosis not present

## 2022-12-12 DIAGNOSIS — J3089 Other allergic rhinitis: Secondary | ICD-10-CM | POA: Diagnosis not present

## 2022-12-12 DIAGNOSIS — J3081 Allergic rhinitis due to animal (cat) (dog) hair and dander: Secondary | ICD-10-CM | POA: Diagnosis not present

## 2022-12-15 ENCOUNTER — Ambulatory Visit: Payer: PPO

## 2022-12-15 DIAGNOSIS — Z0000 Encounter for general adult medical examination without abnormal findings: Secondary | ICD-10-CM | POA: Diagnosis not present

## 2022-12-15 DIAGNOSIS — H1013 Acute atopic conjunctivitis, bilateral: Secondary | ICD-10-CM | POA: Diagnosis not present

## 2022-12-15 NOTE — Patient Instructions (Addendum)
Stacie Ellis , Thank you for taking time to come for your Medicare Wellness Visit. I appreciate your ongoing commitment to your health goals. Please review the following plan we discussed and let me know if I can assist you in the future.   Referrals/Orders/Follow-Ups/Clinician Recommendations: none  This is a list of the screening recommended for you and due dates:  Health Maintenance  Topic Date Due   COVID-19 Vaccine (1) Never done   DTaP/Tdap/Td vaccine (1 - Tdap) Never done   Zoster (Shingles) Vaccine (1 of 2) Never done   Pneumonia Vaccine (1 of 1 - PCV) Never done   Flu Shot  Never done   Mammogram  09/05/2023   Medicare Annual Wellness Visit  12/15/2023   Colon Cancer Screening  05/28/2024   DEXA scan (bone density measurement)  Completed   Hepatitis C Screening  Completed   HPV Vaccine  Aged Out    Advanced directives: (ACP Link)Information on Advanced Care Planning can be found at [facility] [place] Advance Health Care Directives Advance Health Care Directives ([email or web address]   Next Medicare Annual Wellness Visit scheduled for next year: Yes   12/20/23 @ 11:40 am by video

## 2022-12-15 NOTE — Progress Notes (Signed)
Subjective:   Stacie Ellis is a 67 y.o. female who presents for Medicare Annual (Subsequent) preventive examination.  Visit Complete: Virtual I connected with  Stacie Ellis on 12/15/22 by a audio enabled telemedicine application and verified that I am speaking with the correct person using two identifiers.  Patient Location: Home  Provider Location: Office/Clinic  I discussed the limitations of evaluation and management by telemedicine. The patient expressed understanding and agreed to proceed.  Vital Signs: Because this visit was a virtual/telehealth visit, some criteria may be missing or patient reported. Any vitals not documented were not able to be obtained and vitals that have been documented are patient reported.   Cardiac Risk Factors include: advanced age (>72men, >42 women);hypertension;dyslipidemia     Objective:    There were no vitals filed for this visit. There is no height or weight on file to calculate BMI.     12/15/2022   10:48 AM 12/09/2021   11:43 AM 05/29/2019   11:52 AM 08/09/2015    2:33 PM 06/21/2015    2:08 PM 08/05/2014    3:14 PM  Advanced Directives  Does Patient Have a Medical Advance Directive? No No No No No No  Would patient like information on creating a medical advance directive? No - Patient declined No - Patient declined No - Patient declined Yes - Educational materials given  No - patient declined information    Current Medications (verified) Outpatient Encounter Medications as of 12/15/2022  Medication Sig   albuterol (PROVENTIL HFA;VENTOLIN HFA) 108 (90 Base) MCG/ACT inhaler Inhale 2 puffs into the lungs every 6 (six) hours as needed for wheezing or shortness of breath.   amLODipine (NORVASC) 2.5 MG tablet TAKE 1 TABLET(2.5 MG) BY MOUTH TWICE DAILY   aspirin 81 MG tablet Take 81 mg by mouth daily.   carvedilol (COREG) 25 MG tablet Take 1 tablet (25 mg total) by mouth 2 (two) times daily with a meal.   chlorthalidone (HYGROTON)  25 MG tablet Take 0.5 tablets (12.5 mg total) by mouth daily.   Cholecalciferol (VITAMIN D3) 50 MCG (2000 UT) capsule Take 2 capsules (4,000 Units total) by mouth daily.   EPINEPHrine 0.3 mg/0.3 mL IJ SOAJ injection See admin instructions.   fluticasone (FLONASE) 50 MCG/ACT nasal spray 1-2 sprays   fluticasone furoate-vilanterol (BREO ELLIPTA) 100-25 MCG/ACT AEPB Inhale 1 puff into the lungs daily.   halobetasol (ULTRAVATE) 0.05 % cream Apply topically daily.   loratadine (CLARITIN) 10 MG tablet Take 10 mg by mouth daily.   Magnesium Glycinate 100 MG CAPS Take 200 mg by mouth daily.   montelukast (SINGULAIR) 10 MG tablet TAKE 1 TABLET(10 MG) BY MOUTH AT BEDTIME   pantoprazole (PROTONIX) 40 MG tablet Take 1 tablet (40 mg total) by mouth daily before breakfast.   potassium chloride SA (KLOR-CON M) 20 MEQ tablet TAKE 1 TABLET(20 MEQ) BY MOUTH DAILY   Spacer/Aero-Holding Chambers (AEROCHAMBER MV) inhaler See admin instructions.   budesonide-formoterol (SYMBICORT) 80-4.5 MCG/ACT inhaler Inhale 2 puffs into the lungs 2 (two) times daily. (Patient not taking: Reported on 12/15/2022)   No facility-administered encounter medications on file as of 12/15/2022.    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 left  breast lumpectomy, wide excision and a repeat wide excision on 03/10/2010 for multiple positive margins on original resection   Malignant neoplasm of upper-outer quadrant of female breast [facility]) January 2012:    Re-excision to negative margins.    Obesity, unspecified    Postmenopausal bleeding 2013   S/P radiation therapy 2012   whole breast,    Special screening for malignant neoplasms, colon    Past  Surgical History:  Procedure Laterality Date   APPENDECTOMY  2008   BREAST BIOPSY Left 2011   pt states had stereo done on mobile unit at Dr. Rutherford Nail office. DCIS   BREAST LUMPECTOMY Left 02/2010   lumpectomy with re excision 03/2010 of left breast for cancer and rad tx   BREAST SURGERY Left 02/17/2010;03/10/2010   lumpectomy and repeat wide excision   CESAREAN SECTION  1998   COLON SURGERY Right 06/13/2006   Hand-assisted right hemicolectomy. 5.8 cm villous adenoma without atypia   COLONOSCOPY  2015   Normal exam.   COLONOSCOPY WITH PROPOFOL N/A 05/29/2019   Procedure: COLONOSCOPY WITH PROPOFOL;  Surgeon: Midge Minium, MD;  Location: [facility] ENDOSCOPY;  Service: Endoscopy;  Laterality: N/A;   ESOPHAGOGASTRODUODENOSCOPY (EGD) WITH PROPOFOL N/A 06/21/2015   Procedure: ESOPHAGOGASTRODUODENOSCOPY (EGD) WITH PROPOFOL;  Surgeon: Wallace Cullens, MD;  Location[facility] ENDOSCOPY;  Service: Gastroenterology;  Laterality: N/A;   Family History  Problem Relation Age of Onset   Hypertension Father    Cancer Father        colon   Hypertension Brother    Cancer Other        unknown family member with breast cancer   Colon polyps Other        unknown family member with colon polyps   Breast cancer Neg Hx    Social History   Socioeconomic History   Marital status: Married    Spouse name: Not on file   Number of children: Not on file   Years of education: Not on file   Highest education level: Not on file  Occupational History   Not on file  Tobacco Use   Smoking status: Never   Smokeless tobacco: Never  Vaping Use   Vaping status: Never Used  Substance and Sexual Activity   Alcohol use: No   Drug use: No   Sexual activity: Yes    Birth control/protection: Post-menopausal  Other Topics Concern   Not on file  Social History Narrative   Not on file   Social Determinants of Health   Financial Resource Strain: Low Risk  (12/15/2022)   Overall Financial Resource Strain (CARDIA)     Difficulty of Paying Living Expenses: Not hard at all  Food Insecurity: No Food Insecurity (12/15/2022)   Hunger Vital Sign    Worried About Running Out of Food in the Last Year: Never true    Ran Out of Food in the Last Year: Never true  Transportation Needs: No Transportation Needs (12/15/2022)   PRAPARE - Administrator, Civil Service (Medical): No    Lack of Transportation (Non-Medical): No  Physical Activity: Insufficiently Active (12/15/2022)   Exercise Vital Sign    Days of Exercise per Week: 3 days    Minutes of Exercise per Session: 30 min  Stress: No Stress Concern Present (12/15/2022)   [employer] of Occupational Health - Occupational Stress Questionnaire    Feeling of Stress : Not at all  Social Connections: Moderately Integrated (12/15/2022)   Social Connection and Isolation Panel [NHANES]  Frequency of Communication with Friends and Family: More than three times a week    Frequency of Social Gatherings with Friends and Family: More than three times a week    Attends Religious Services: More than 4 times per year    Active Member of [employer] or Organizations: No    Attends Engineer, structural: Never    Marital Status: Married    Tobacco Counseling Counseling given: Not Answered   Clinical Intake:  Pre-visit preparation completed: Yes  Pain : No/denies pain     Nutritional Status: BMI 25 -29 Overweight Nutritional Risks: None Diabetes: No  How often do you need to have someone help you when you read instructions, pamphlets, or other written materials from your doctor or pharmacy?: 1 - Never  Interpreter Needed?: No  Information entered by :: Kennedy Bucker, LPN   Activities of Daily Living    12/15/2022   10:49 AM  In your present state of health, do you have any difficulty performing the following activities:  Hearing? 0  Vision? 0  Difficulty concentrating or making decisions? 0  Walking or climbing stairs? 0  Dressing  or bathing? 0  Doing errands, shopping? 0  Preparing Food and eating ? N  Using the Toilet? N  In the past six months, have you accidently leaked urine? N  Do you have problems with loss of bowel control? N  Managing your Medications? N  Managing your Finances? N  Housekeeping or managing your Housekeeping? N    Patient Care Team: Smitty Cords, DO as PCP - General (Family Medicine) Lemar Livings Merrily Pew, MD (General Surgery) Arvil Chaco, Belia Heman, MD (Unknown Physician Specialty) Wenda Overland, LCSW as Social Worker  Indicate any recent Medical Services you may have received from other than [facility] providers in the past year (date may be approximate).     Assessment:   This is a routine wellness examination for [place].  Hearing/Vision screen Hearing Screening - Comments:: No aids Vision Screening - Comments:: Wears glasses- [facility]   Goals Addressed             This Visit's Progress    Cut out extra servings       DIET - INCREASE WATER INTAKE         Depression Screen    12/15/2022   10:47 AM 12/09/2021   11:42 AM 08/04/2021   10:53 AM 02/07/2021    1:58 PM 12/08/2020   10:19 AM 06/30/2020   11:02 AM 06/02/2020   10:45 AM  PHQ 2/9 Scores  PHQ - 2 Score 0 0 0 0 0 0 0  PHQ- 9 Score 0 0     1    Fall Risk    12/15/2022   10:49 AM 12/09/2021   11:43 AM 02/07/2021    1:58 PM 12/08/2020   10:18 AM 06/30/2020   11:02 AM  Fall Risk   Falls in the past year? 0 0 0 0 0  Number falls in past yr: 0 0     Injury with Fall? 0 0     Risk for fall due to : No Fall Risks No Fall Risks No Fall Risks No Fall Risks   Follow up Falls prevention discussed;Falls evaluation completed Falls prevention discussed;Falls evaluation completed Falls evaluation completed Falls evaluation completed     MEDICARE RISK AT HOME: Medicare Risk at Home Any stairs in or around the home?: Yes If so, are there any without handrails?: No Home free  of loose throw rugs in walkways, pet  beds, electrical cords, etc?: Yes Adequate lighting in your home to reduce risk of falls?: Yes Life alert?: No Use of a cane, walker or w/c?: No Grab bars in the bathroom?: No Shower chair or bench in shower?: No Elevated toilet seat or a handicapped toilet?: Yes  TIMED UP AND GO:  Was the test performed?  No    Cognitive Function:    12/08/2020   10:22 AM 12/08/2019   11:09 AM  MMSE - Mini Mental State Exam  Orientation to time 5 5  Orientation to Place 5 5  Registration 3 3  Attention/ Calculation 5 5  Recall 3 3  Language- name 2 objects 2 2  Language- repeat 1 1  Language- follow 3 step command 3 3  Language- read & follow direction 1 1  Write a sentence 1 1  Copy design 1 1  Total score 30 30        12/15/2022   10:50 AM 12/09/2021   11:44 AM  6CIT Screen  What Year? 0 points 0 points  What month? 0 points 0 points  What time? 0 points 0 points  Count back from 20 0 points 0 points  Months in reverse 0 points 0 points  Repeat phrase 0 points 0 points  Total Score 0 points 0 points    Immunizations There is no immunization history for the selected administration types on file for this patient.  TDAP status: Due, Education has been provided regarding the importance of this vaccine. Advised may receive this vaccine at local pharmacy or Health Dept. Aware to provide a copy of the vaccination record if obtained from local pharmacy or Health Dept. Verbalized acceptance and understanding.  Flu Vaccine status: Declined, Education has been provided regarding the importance of this vaccine but patient still declined. Advised may receive this vaccine at local pharmacy or Health Dept. Aware to provide a copy of the vaccination record if obtained from local pharmacy or Health Dept. Verbalized acceptance and understanding.  Pneumococcal vaccine status: Declined,  Education has been provided regarding the importance of this vaccine but patient still declined. Advised may  receive this vaccine at local pharmacy or Health Dept. Aware to provide a copy of the vaccination record if obtained from local pharmacy or Health Dept. Verbalized acceptance and understanding.   Covid-19 vaccine status: Declined, Education has been provided regarding the importance of this vaccine but patient still declined. Advised may receive this vaccine at local pharmacy or Health Dept.or vaccine clinic. Aware to provide a copy of the vaccination record if obtained from local pharmacy or Health Dept. Verbalized acceptance and understanding.  Qualifies for Shingles Vaccine? Yes   Zostavax completed No   Shingrix Completed?: No.    Education has been provided regarding the importance of this vaccine. Patient has been advised to call insurance company to determine out of pocket expense if they have not yet received this vaccine. Advised may also receive vaccine at local pharmacy or Health Dept. Verbalized acceptance and understanding.  Screening Tests Health Maintenance  Topic Date Due   COVID-19 Vaccine (1) Never done   DTaP/Tdap/Td (1 - Tdap) Never done   Zoster Vaccines- Shingrix (1 of 2) Never done   Pneumonia Vaccine 64+ Years old (1 of 1 - PCV) Never done   INFLUENZA VACCINE  Never done   MAMMOGRAM  09/05/2023   Medicare Annual Wellness (AWV)  12/15/2023   Colonoscopy  05/28/2024   DEXA SCAN  Completed   Hepatitis C Screening  Completed   HPV VACCINES  Aged Out    Health Maintenance  Health Maintenance Due  Topic Date Due   COVID-19 Vaccine (1) Never done   DTaP/Tdap/Td (1 - Tdap) Never done   Zoster Vaccines- Shingrix (1 of 2) Never done   Pneumonia Vaccine 25+ Years old (1 of 1 - PCV) Never done   INFLUENZA VACCINE  Never done    Colorectal cancer screening: Type of screening: Colonoscopy. Completed 05/29/19. Repeat every 5 years  Mammogram status: Completed 09/05/22. Repeat every year  Bone Density status: Completed 03/17/21. Results reflect: Bone density results:  OSTEOPENIA. Repeat every 5 years.  Lung Cancer Screening: (Low Dose CT Chest recommended if Age 50-80 years, 20 pack-year currently smoking OR have quit w/in 15years.) does not qualify.     Additional Screening:  Hepatitis C Screening: does qualify; Completed 09/04/19  Vision Screening: Recommended annual ophthalmology exams for early detection of glaucoma and other disorders of the eye. Is the patient up to date with their annual eye exam?  Yes  Who is the provider or what is the name of the office in which the patient attends annual eye exams? [facility] If pt is not established with a provider, would they like to be referred to a provider to establish care? No .   Dental Screening: Recommended annual dental exams for proper oral hygiene   Community Resource Referral / Chronic Care Management: CRR required this visit?  No   CCM required this visit?  No     Plan:     I have personally reviewed and noted the following in the patient's chart:   Medical and social history Use of alcohol, tobacco or illicit drugs  Current medications and supplements including opioid prescriptions. Patient is not currently taking opioid prescriptions. Functional ability and status Nutritional status Physical activity Advanced directives List of other physicians Hospitalizations, surgeries, and ER visits in previous 12 months Vitals Screenings to include cognitive, depression, and falls Referrals and appointments  In addition, I have reviewed and discussed with patient certain preventive protocols, quality metrics, and best practice recommendations. A written personalized care plan for preventive services as well as general preventive health recommendations were provided to patient.     Hal Hope, LPN   16/12/9602   After Visit Summary: (MyChart) Due to this being a telephonic visit, the after visit summary with patients personalized plan was offered to patient via MyChart   Nurse  Notes: none

## 2023-01-02 DIAGNOSIS — J301 Allergic rhinitis due to pollen: Secondary | ICD-10-CM | POA: Diagnosis not present

## 2023-01-02 DIAGNOSIS — J3089 Other allergic rhinitis: Secondary | ICD-10-CM | POA: Diagnosis not present

## 2023-01-02 DIAGNOSIS — J3081 Allergic rhinitis due to animal (cat) (dog) hair and dander: Secondary | ICD-10-CM | POA: Diagnosis not present

## 2023-01-06 ENCOUNTER — Other Ambulatory Visit: Payer: Self-pay | Admitting: Family Medicine

## 2023-01-06 DIAGNOSIS — K219 Gastro-esophageal reflux disease without esophagitis: Secondary | ICD-10-CM

## 2023-01-06 DIAGNOSIS — D0512 Intraductal carcinoma in situ of left breast: Secondary | ICD-10-CM

## 2023-01-08 NOTE — Telephone Encounter (Signed)
Requested Prescriptions  Pending Prescriptions Disp Refills   pantoprazole (PROTONIX) 40 MG tablet [Pharmacy Med Name: PANTOPRAZOLE 40MG  TABLETS] 90 tablet 0    Sig: TAKE 1 TABLET(40 MG) BY MOUTH DAILY BEFORE BREAKFAST     Gastroenterology: Proton Pump Inhibitors Passed - 01/06/2023  7:02 AM      Passed - Valid encounter within last 12 months    Recent Outpatient Visits           5 months ago Pre-diabetes   [facility] Smitty Cords, DO   11 months ago Annual physical exam   [place] [facility] Smitty Cords, DO   1 year ago Essential hypertension   [place] [facility] [place], Netta Neat, DO       Future Appointments             In 1 week Alver Sorrow, NP [place] [facility]   In 3 weeks Althea Charon, Netta Neat, DO [place] [facility]             carvedilol (COREG) 25 MG tablet [Pharmacy Med Name: CARVEDILOL 25MG  TABLETS] 180 tablet 0    Sig: TAKE 1 TABLET(25 MG) BY MOUTH TWICE DAILY WITH A MEAL     Cardiovascular: Beta Blockers 3 Passed - 01/06/2023  7:02 AM      Passed - Cr in normal range and within 360 days    Creat  Date Value Ref Range Status  01/11/2022 0.64 0.50 - 1.05 mg/dL Final         Passed - AST in normal range and within 360 days    AST  Date Value Ref Range Status  01/11/2022 19 10 - 35 U/L Final   SGOT(AST)  Date Value Ref Range Status  06/24/2013 14 (L) 15 - 37 Unit/L Final         Passed - ALT in normal range and within 360 days    ALT  Date Value Ref Range Status  01/11/2022 15 6 - 29 U/L Final   SGPT (ALT)  Date Value Ref Range Status  06/24/2013 17 12 - 78 U/L Final         Passed - Last BP in normal range    BP Readings from Last 1 Encounters:  07/19/22 134/84         Passed - Last Heart Rate in normal range    Pulse Readings from Last 1 Encounters:  07/19/22 78          Passed - Valid encounter within last 6 months    Recent Outpatient Visits           5 months ago Pre-diabetes   [place] [facility] Smitty Cords, DO   11 months ago Annual physical exam   [place] [facility] Smitty Cords, DO   1 year ago Essential hypertension   [place] [facility] [place], Netta Neat, DO       Future Appointments             In 1 week Dan Humphreys, Storm Frisk, NP [place] [facility]   In 3 weeks Althea Charon, Netta Neat, DO [place] [facility]

## 2023-01-11 DIAGNOSIS — J3081 Allergic rhinitis due to animal (cat) (dog) hair and dander: Secondary | ICD-10-CM | POA: Diagnosis not present

## 2023-01-11 DIAGNOSIS — J3089 Other allergic rhinitis: Secondary | ICD-10-CM | POA: Diagnosis not present

## 2023-01-16 DIAGNOSIS — J3089 Other allergic rhinitis: Secondary | ICD-10-CM | POA: Diagnosis not present

## 2023-01-16 DIAGNOSIS — J3081 Allergic rhinitis due to animal (cat) (dog) hair and dander: Secondary | ICD-10-CM | POA: Diagnosis not present

## 2023-01-16 DIAGNOSIS — J301 Allergic rhinitis due to pollen: Secondary | ICD-10-CM | POA: Diagnosis not present

## 2023-01-19 ENCOUNTER — Ambulatory Visit (HOSPITAL_BASED_OUTPATIENT_CLINIC_OR_DEPARTMENT_OTHER): Payer: PPO | Admitting: Family

## 2023-01-19 ENCOUNTER — Other Ambulatory Visit: Payer: PPO

## 2023-01-23 DIAGNOSIS — J301 Allergic rhinitis due to pollen: Secondary | ICD-10-CM | POA: Diagnosis not present

## 2023-01-23 DIAGNOSIS — J3089 Other allergic rhinitis: Secondary | ICD-10-CM | POA: Diagnosis not present

## 2023-01-23 DIAGNOSIS — J3081 Allergic rhinitis due to animal (cat) (dog) hair and dander: Secondary | ICD-10-CM | POA: Diagnosis not present

## 2023-01-24 ENCOUNTER — Other Ambulatory Visit: Payer: PPO

## 2023-01-24 DIAGNOSIS — Z0000 Encounter for general adult medical examination without abnormal findings: Secondary | ICD-10-CM | POA: Diagnosis not present

## 2023-01-24 DIAGNOSIS — I10 Essential (primary) hypertension: Secondary | ICD-10-CM

## 2023-01-24 DIAGNOSIS — E782 Mixed hyperlipidemia: Secondary | ICD-10-CM

## 2023-01-24 DIAGNOSIS — R7303 Prediabetes: Secondary | ICD-10-CM

## 2023-01-25 LAB — CBC WITH DIFFERENTIAL/PLATELET
Absolute Lymphocytes: 2393 {cells}/uL (ref 850–3900)
Absolute Monocytes: 616 {cells}/uL (ref 200–950)
Basophils Absolute: 33 {cells}/uL (ref 0–200)
Basophils Relative: 0.6 %
Eosinophils Absolute: 352 {cells}/uL (ref 15–500)
Eosinophils Relative: 6.4 %
HCT: 42.7 % (ref 35.0–45.0)
Hemoglobin: 14.2 g/dL (ref 11.7–15.5)
MCH: 28.7 pg (ref 27.0–33.0)
MCHC: 33.3 g/dL (ref 32.0–36.0)
MCV: 86.3 fL (ref 80.0–100.0)
MPV: 11.7 fL (ref 7.5–12.5)
Monocytes Relative: 11.2 %
Neutro Abs: 2107 {cells}/uL (ref 1500–7800)
Neutrophils Relative %: 38.3 %
Platelets: 231 10*3/uL (ref 140–400)
RBC: 4.95 10*6/uL (ref 3.80–5.10)
RDW: 13 % (ref 11.0–15.0)
Total Lymphocyte: 43.5 %
WBC: 5.5 10*3/uL (ref 3.8–10.8)

## 2023-01-25 LAB — COMPLETE METABOLIC PANEL WITH GFR
AG Ratio: 1.5 (calc) (ref 1.0–2.5)
ALT: 15 U/L (ref 6–29)
AST: 20 U/L (ref 10–35)
Albumin: 4.1 g/dL (ref 3.6–5.1)
Alkaline phosphatase (APISO): 72 U/L (ref 37–153)
BUN: 13 mg/dL (ref 7–25)
CO2: 33 mmol/L — ABNORMAL HIGH (ref 20–32)
Calcium: 9.6 mg/dL (ref 8.6–10.4)
Chloride: 99 mmol/L (ref 98–110)
Creat: 0.66 mg/dL (ref 0.50–1.05)
Globulin: 2.8 g/dL (ref 1.9–3.7)
Glucose, Bld: 91 mg/dL (ref 65–99)
Potassium: 3.6 mmol/L (ref 3.5–5.3)
Sodium: 140 mmol/L (ref 135–146)
Total Bilirubin: 0.6 mg/dL (ref 0.2–1.2)
Total Protein: 6.9 g/dL (ref 6.1–8.1)
eGFR: 95 mL/min/{1.73_m2} (ref >=60)

## 2023-01-25 LAB — HEMOGLOBIN A1C
Hgb A1c MFr Bld: 6 %{Hb} — ABNORMAL HIGH (ref <5.7)
Mean Plasma Glucose: 126 mg/dL
eAG (mmol/L): 7 mmol/L

## 2023-01-25 LAB — LIPID PANEL
Cholesterol: 228 mg/dL — ABNORMAL HIGH (ref <200)
HDL: 63 mg/dL (ref >=50)
LDL Cholesterol (Calc): 140 mg/dL — ABNORMAL HIGH
Non-HDL Cholesterol (Calc): 165 mg/dL — ABNORMAL HIGH (ref <130)
Total CHOL/HDL Ratio: 3.6 (calc) (ref <5.0)
Triglycerides: 129 mg/dL (ref <150)

## 2023-01-25 LAB — TSH: TSH: 3.21 m[IU]/L (ref 0.40–4.50)

## 2023-01-31 ENCOUNTER — Encounter: Payer: Self-pay | Admitting: Family Medicine

## 2023-01-31 ENCOUNTER — Ambulatory Visit (INDEPENDENT_AMBULATORY_CARE_PROVIDER_SITE_OTHER): Payer: PPO | Admitting: Family Medicine

## 2023-01-31 VITALS — BP 122/68 | Ht 60.0 in | Wt 159.8 lb

## 2023-01-31 DIAGNOSIS — E663 Overweight: Secondary | ICD-10-CM | POA: Diagnosis not present

## 2023-01-31 DIAGNOSIS — Z0000 Encounter for general adult medical examination without abnormal findings: Secondary | ICD-10-CM

## 2023-01-31 DIAGNOSIS — R7303 Prediabetes: Secondary | ICD-10-CM

## 2023-01-31 DIAGNOSIS — K219 Gastro-esophageal reflux disease without esophagitis: Secondary | ICD-10-CM | POA: Diagnosis not present

## 2023-01-31 DIAGNOSIS — E782 Mixed hyperlipidemia: Secondary | ICD-10-CM

## 2023-01-31 DIAGNOSIS — I10 Essential (primary) hypertension: Secondary | ICD-10-CM

## 2023-01-31 MED ORDER — PANTOPRAZOLE SODIUM 40 MG PO TBEC: 40.0000 mg | DELAYED_RELEASE_TABLET | Freq: Every day | ORAL | 3 refills | Status: DC

## 2023-01-31 MED ORDER — AMLODIPINE BESYLATE 2.5 MG PO TABS: 2.5000 mg | ORAL_TABLET | Freq: Every day | ORAL | 3 refills | Status: DC

## 2023-01-31 MED ORDER — CARVEDILOL 25 MG PO TABS: 25.0000 mg | ORAL_TABLET | Freq: Two times a day (BID) | ORAL | 3 refills | Status: DC

## 2023-01-31 NOTE — Progress Notes (Signed)
Subjective:    Patient ID: Stacie Ellis, female    DOB: 01-Jun-1955, 67 y.o.   MRN: [id number]  Stacie Ellis is a 67 y.o. female presenting on 01/31/2023 for Annual Exam   HPI  Discussed the use of AI scribe software for clinical note transcription with the patient, who gave verbal consent to proceed.   The patient, with a history of hyperlipidemia and diabetes, presents for an annual physical. They express disappointment in their recent lab results, specifically the A1C of 6.0 and cholesterol levels, which they had hoped would decrease. They report adherence to their current medication regimen, including pantoprazole, Singulair, and a diuretic.  The patient has been making efforts to improve their diet and was hoping for a downward trend in their A1C and cholesterol levels. However, they acknowledge a recent weight gain, which may have counteracted their dietary efforts. To address this, they have invested in a rebounder for indoor exercise during the winter months when outdoor walking is less feasible.  The patient also reports frequent nocturia but denies any other urinary symptoms. They have not noticed any fluid retention recently. They have a history of hypertension, which appears to be well-managed with the help of a cardiology team.  The patient has been proactive in their health management, having completed a mammogram and colonoscopy within the past year. They express interest in a coronary artery calcium score CT scan to assess their cardiovascular risk, given their cholesterol levels. They are open to considering additional cholesterol-lowering medications if necessary, based on the scan results.      HYPERLIPIDEMIA: Last lipid panel 01/2023 with LDL up to 140 - Currently taking no medication, failed Rosuvastatin 5mg  and Atorvastatin 10mg  previously Lifestyle - Diet: goal to improve cholesterol    Pre-Diabetes Obesity BMI >31 Last A1c 6.0, stable range for past  1+ years. She has strong fam history of DIABETES Admits higher carb. Weight gain 6 lbs in 6 months   Ventral Hernia History of partial colectomy due to pre-cancerous polyps. Done 2008 Questioning future hernia repair   PMH GERD / Hoarseness of Voice On PPI Pantoprazole 40mg  daily, has some breakthrough symptoms.    CHRONIC HTN: Followed by [facility] Has done well with med adjustments overall Prior work up and eval done. She has been on BP medication since age 10s She has significant fam history of father and older brother with HTN diagnosed in their 70s. Her BP cuff similar to our readings  Current Meds - Amlodipine 2.5mg  BID - Carvedilol 25mg  TWICE A DAY - Chlorthalidone 12.5mg  (half of 25mg ) daily OFF Lisinopril, HCTZ, Telmisartan   Reports good compliance, took meds today. Tolerating well, w/o complaints. Denies CP, dyspnea, HA, edema, dizziness / lightheadedness     Health Maintenance:  Mammogram 09/05/22 negative  Discussed Pneumonia vaccine Prevnar-20  Decline Flu  Colonoscopy done 05/29/19 next due 5 years 2026     01/31/2023    9:34 AM 12/15/2022   10:47 AM 12/09/2021   11:42 AM  Depression screen PHQ 2/9  Decreased Interest 0 0 0  Down, Depressed, Hopeless 0 0 0  PHQ - 2 Score 0 0 0  Altered sleeping 0 0 0  Tired, decreased energy 0 0 0  Change in appetite 0 0 0  Feeling bad or failure about yourself  0 0 0  Trouble concentrating 0 0 0  Moving slowly or fidgety/restless 0 0 0  Suicidal thoughts 0 0 0  PHQ-9 Score 0 0  0  Difficult doing work/chores Not difficult at all Not difficult at all Not difficult at all       01/31/2023    9:34 AM 06/02/2020   10:45 AM  GAD 7 : Generalized Anxiety Score  Nervous, Anxious, on Edge 0 0  Control/stop worrying 0 0  Worry too much - different things 0 0  Trouble relaxing 0 0  Restless 0 0  Easily annoyed or irritable 0 0  Afraid - awful might happen 0 0  Total GAD 7 Score 0 0   Anxiety Difficulty Not difficult at all Not difficult at all     Past Medical History:  Diagnosis Date   Asthma    Breast cancer (HCC) 02/2010   GERD (gastroesophageal reflux disease) 2008   Hypertension    Malignant neoplasm of upper-outer quadrant of female breast (HCC) 2011   Intermediate grade DCIS, ER: 50%; PR 10%. left breast lumpectomy, wide excision and a repeat wide excision on 03/10/2010 for multiple positive margins on original resection   Malignant neoplasm of upper-outer quadrant of female breast [facility]) January 2012:    Re-excision to negative margins.    Obesity, unspecified    Postmenopausal bleeding 2013   S/P radiation therapy 2012   whole breast,    Special screening for malignant neoplasms, colon    Past Surgical History:  Procedure Laterality Date   APPENDECTOMY  2008   BREAST BIOPSY Left 2011   pt states had stereo done on mobile unit at Dr. Rutherford Nail office. DCIS   BREAST LUMPECTOMY Left 02/2010   lumpectomy with re excision 03/2010 of left breast for cancer and rad tx   BREAST SURGERY Left 02/17/2010;03/10/2010   lumpectomy and repeat wide excision   CESAREAN SECTION  1998   COLON SURGERY Right 06/13/2006   Hand-assisted right hemicolectomy. 5.8 cm villous adenoma without atypia   COLONOSCOPY  2015   Normal exam.   COLONOSCOPY WITH PROPOFOL N/A 05/29/2019   Procedure: COLONOSCOPY WITH PROPOFOL;  Surgeon: Midge Minium, MD;  Location[facility] ENDOSCOPY;  Service: Endoscopy;  Laterality: N/A;   ESOPHAGOGASTRODUODENOSCOPY (EGD) WITH PROPOFOL N/A 06/21/2015   Procedure: ESOPHAGOGASTRODUODENOSCOPY (EGD) WITH PROPOFOL;  Surgeon: Wallace Cullens, MD;  Location: [facility] ENDOSCOPY;  Service: Gastroenterology;  Laterality: N/A;   Social History   Socioeconomic History   Marital status: Married    Spouse name: Not on file   Number of children: Not on file   Years of education: Not on file   Highest education level: Not on file  Occupational History   Not on file  Tobacco  Use   Smoking status: Never   Smokeless tobacco: Never  Vaping Use   Vaping status: Never Used  Substance and Sexual Activity   Alcohol use: No   Drug use: No   Sexual activity: Yes    Birth control/protection: Post-menopausal  Other Topics Concern   Not on file  Social History Narrative   Not on file   Social Determinants of Health   Financial Resource Strain: Low Risk  (12/15/2022)   Overall Financial Resource Strain (CARDIA)    Difficulty of Paying Living Expenses: Not hard at all  Food Insecurity: No Food Insecurity (12/15/2022)   Hunger Vital Sign    Worried About Running Out of Food in the Last Year: Never true    Ran Out of Food in the Last Year: Never true  Transportation Needs: No Transportation Needs (12/15/2022)   PRAPARE - Administrator, Civil Service (Medical):  No    Lack of Transportation (Non-Medical): No  Physical Activity: Insufficiently Active (12/15/2022)   Exercise Vital Sign    Days of Exercise per Week: 3 days    Minutes of Exercise per Session: 30 min  Stress: No Stress Concern Present (12/15/2022)   [employer] of Occupational Health - Occupational Stress Questionnaire    Feeling of Stress : Not at all  Social Connections: Moderately Integrated (12/15/2022)   Social Connection and Isolation Panel [NHANES]    Frequency of Communication with Friends and Family: More than three times a week    Frequency of Social Gatherings with Friends and Family: More than three times a week    Attends Religious Services: More than 4 times per year    Active Member of [employer] or Organizations: No    Attends Banker Meetings: Never    Marital Status: Married  Catering manager Violence: Not At Risk (12/15/2022)   Humiliation, Afraid, Rape, and Kick questionnaire    Fear of Current or Ex-Partner: No    Emotionally Abused: No    Physically Abused: No    Sexually Abused: No   Family History  Problem Relation Age of Onset   Hypertension  Father    Cancer Father        colon   Hypertension Brother    Cancer Other        unknown family member with breast cancer   Colon polyps Other        unknown family member with colon polyps   Breast cancer Neg Hx    Current Outpatient Medications on File Prior to Visit  Medication Sig   aspirin 81 MG tablet Take 81 mg by mouth daily.   chlorthalidone (HYGROTON) 25 MG tablet Take 0.5 tablets (12.5 mg total) by mouth daily.   Cholecalciferol (VITAMIN D3) 50 MCG (2000 UT) capsule Take 2 capsules (4,000 Units total) by mouth daily.   EPINEPHrine 0.3 mg/0.3 mL IJ SOAJ injection See admin instructions.   fluticasone (FLONASE) 50 MCG/ACT nasal spray 1-2 sprays   fluticasone furoate-vilanterol (BREO ELLIPTA) 100-25 MCG/ACT AEPB Inhale 1 puff into the lungs daily.   halobetasol (ULTRAVATE) 0.05 % cream Apply topically daily.   loratadine (CLARITIN) 10 MG tablet Take 10 mg by mouth daily.   Magnesium Glycinate 100 MG CAPS Take 200 mg by mouth daily.   montelukast (SINGULAIR) 10 MG tablet TAKE 1 TABLET(10 MG) BY MOUTH AT BEDTIME   potassium chloride SA (KLOR-CON M) 20 MEQ tablet TAKE 1 TABLET(20 MEQ) BY MOUTH DAILY   albuterol (PROVENTIL HFA;VENTOLIN HFA) 108 (90 Base) MCG/ACT inhaler Inhale 2 puffs into the lungs every 6 (six) hours as needed for wheezing or shortness of breath. (Patient not taking: Reported on 01/31/2023)   budesonide-formoterol (SYMBICORT) 80-4.5 MCG/ACT inhaler Inhale 2 puffs into the lungs 2 (two) times daily. (Patient not taking: Reported on 12/15/2022)   Spacer/Aero-Holding Chambers (AEROCHAMBER MV) inhaler See admin instructions. (Patient not taking: Reported on 01/31/2023)   No current facility-administered medications on file prior to visit.    Review of Systems  Constitutional:  Negative for activity change, appetite change, chills, diaphoresis, fatigue and fever.  HENT:  Negative for congestion and hearing loss.   Eyes:  Negative for visual disturbance.   Respiratory:  Negative for cough, chest tightness, shortness of breath and wheezing.   Cardiovascular:  Negative for chest pain, palpitations and leg swelling.  Gastrointestinal:  Negative for abdominal pain, constipation, diarrhea, nausea and vomiting.  Genitourinary:  Negative  for dysuria, frequency and hematuria.  Musculoskeletal:  Negative for arthralgias and neck pain.  Skin:  Negative for rash.  Neurological:  Negative for dizziness, weakness, light-headedness, numbness and headaches.  Hematological:  Negative for adenopathy.  Psychiatric/Behavioral:  Negative for behavioral problems, dysphoric mood and sleep disturbance.    Per HPI unless specifically indicated above     Objective:    BP 122/68   Ht 5' (1.524 m)   Wt 159 lb 12.8 oz (72.5 kg)   BMI 31.21 kg/m   Wt Readings from Last 3 Encounters:  01/31/23 159 lb 12.8 oz (72.5 kg)  07/19/22 153 lb (69.4 kg)  01/16/22 152 lb 6.4 oz (69.1 kg)    Physical Exam Vitals and nursing note reviewed.  Constitutional:      General: She is not in acute distress.    Appearance: She is well-developed. She is not diaphoretic.     Comments: Well-appearing, comfortable, cooperative  HENT:     Head: Normocephalic and atraumatic.  Eyes:     General:        Right eye: No discharge.        Left eye: No discharge.     Conjunctiva/sclera: Conjunctivae normal.     Pupils: Pupils are equal, round, and reactive to light.  Neck:     Thyroid: No thyromegaly.     Vascular: No carotid bruit.  Cardiovascular:     Rate and Rhythm: Normal rate and regular rhythm.     Pulses: Normal pulses.     Heart sounds: Normal heart sounds. No murmur heard. Pulmonary:     Effort: Pulmonary effort is normal. No respiratory distress.     Breath sounds: Normal breath sounds. No wheezing or rales.  Abdominal:     General: Bowel sounds are normal. There is no distension.     Palpations: Abdomen is soft. There is no mass.     Tenderness: There is no  abdominal tenderness.  Musculoskeletal:        General: No tenderness. Normal range of motion.     Cervical back: Normal range of motion and neck supple.     Right lower leg: No edema.     Left lower leg: No edema.     Comments: Upper / Lower Extremities: - Normal muscle tone, strength bilateral upper extremities 5/5, lower extremities 5/5  Lymphadenopathy:     Cervical: No cervical adenopathy.  Skin:    General: Skin is warm and dry.     Findings: No erythema or rash.  Neurological:     Mental Status: She is alert and oriented to person, place, and time.     Comments: Distal sensation intact to light touch all extremities  Psychiatric:        Mood and Affect: Mood normal.        Behavior: Behavior normal.        Thought Content: Thought content normal.     Comments: Well groomed, good eye contact, normal speech and thoughts     Results for orders placed or performed in visit on 01/24/23  TSH  Result Value Ref Range   TSH 3.21 0.40 - 4.50 mIU/L  Lipid panel  Result Value Ref Range   Cholesterol 228 (H) <200 mg/dL   HDL 63 > OR = 50 mg/dL   Triglycerides 161 <096 mg/dL   LDL Cholesterol (Calc) 140 (H) mg/dL (calc)   Total CHOL/HDL Ratio 3.6 <5.0 (calc)   Non-HDL Cholesterol (Calc) 165 (H) <130 mg/dL (calc)  Hemoglobin A1c  Result Value Ref Range   Hgb A1c MFr Bld 6.0 (H) <5.7 % of total Hgb   Mean Plasma Glucose 126 mg/dL   eAG (mmol/L) 7.0 mmol/L  CBC with Differential/Platelet  Result Value Ref Range   WBC 5.5 3.8 - 10.8 Thousand/uL   RBC 4.95 3.80 - 5.10 Million/uL   Hemoglobin 14.2 11.7 - 15.5 g/dL   HCT 03.4 74.2 - 59.5 %   MCV 86.3 80.0 - 100.0 fL   MCH 28.7 27.0 - 33.0 pg   MCHC 33.3 32.0 - 36.0 g/dL   RDW 63.8 75.6 - 43.3 %   Platelets 231 140 - 400 Thousand/uL   MPV 11.7 7.5 - 12.5 fL   Neutro Abs 2,107 1,500 - 7,800 cells/uL   Absolute Lymphocytes 2,393 850 - 3,900 cells/uL   Absolute Monocytes 616 200 - 950 cells/uL   Eosinophils Absolute 352 15 - 500  cells/uL   Basophils Absolute 33 0 - 200 cells/uL   Neutrophils Relative % 38.3 %   Total Lymphocyte 43.5 %   Monocytes Relative 11.2 %   Eosinophils Relative 6.4 %   Basophils Relative 0.6 %  COMPLETE METABOLIC PANEL WITH GFR  Result Value Ref Range   Glucose, Bld 91 65 - 99 mg/dL   BUN 13 7 - 25 mg/dL   Creat 2.95 1.88 - 4.16 mg/dL   eGFR 95 > OR = 60 SA/YTK/1.60F0   BUN/Creatinine Ratio SEE NOTE: 6 - 22 (calc)   Sodium 140 135 - 146 mmol/L   Potassium 3.6 3.5 - 5.3 mmol/L   Chloride 99 98 - 110 mmol/L   CO2 33 (H) 20 - 32 mmol/L   Calcium 9.6 8.6 - 10.4 mg/dL   Total Protein 6.9 6.1 - 8.1 g/dL   Albumin 4.1 3.6 - 5.1 g/dL   Globulin 2.8 1.9 - 3.7 g/dL (calc)   AG Ratio 1.5 1.0 - 2.5 (calc)   Total Bilirubin 0.6 0.2 - 1.2 mg/dL   Alkaline phosphatase (APISO) 72 37 - 153 U/L   AST 20 10 - 35 U/L   ALT 15 6 - 29 U/L      Assessment & Plan:   Problem List Items Addressed This Visit     Essential hypertension    Controlled Home BP cuff calibrated Followed by [facility] Renal artery dopplers negative No other underlying cause of HYPERTENSION identified OFF Lisinopril, HCTZ, Telmisartan   Plan:  1. Continue current BP regimen -  - Amlodipine 2.5mg  BID - Carvedilol 25mg  TWICE A DAY - Chlorthalidone 12.5mg  (half of 25mg ) daily  2. Encourage improved lifestyle - low sodium diet, regular exercise 3. Continue monitor BP outside office, bring readings to next visit, if persistently >140/90 or new symptoms notify office sooner      Relevant Medications   carvedilol (COREG) 25 MG tablet   amLODipine (NORVASC) 2.5 MG tablet   Gastroesophageal reflux disease without esophagitis   Relevant Medications   pantoprazole (PROTONIX) 40 MG tablet   Mixed hyperlipidemia    LDL 140, not at goal The 10-year ASCVD risk score (Arnett DK, et al., 2019) is: 10.5% Failed Rosuvastatin 5mg  and Atorvastatin 10mg  - myalgia  Plan: Defer med today, previously  discussed Statins, Zetia, PCSK9 Pursue Coronary Calcium CT Score first and pending result, reconsider Zetia vs Repatha depending on diagnosis and coverage Encourage improved lifestyle - low carb/cholesterol, reduce portion size, continue improving regular exercise      Relevant Medications   carvedilol (COREG)  25 MG tablet   amLODipine (NORVASC) 2.5 MG tablet   Other Relevant Orders   CT CARDIAC SCORING (SELF PAY ONLY)   Overweight (BMI 25.0-29.9)   Pre-diabetes    Mild elevated A1c to 6.0, stable from prior Fam history diabetes  Plan:  1. Not on any therapy currently  2. Encourage improved lifestyle - low carb, low sugar diet, reduce portion size, continue improving regular exercise 3. Follow-up 6 mo with A1c       Other Visit Diagnoses     Annual physical exam    -  Primary        Updated Health Maintenance information Reviewed recent lab results with patient Encouraged improvement to lifestyle with diet and exercise Goal of weight loss   Nocturia Patient reports frequent urination at night. This could be due to natural aging or possibly a side effect of diuretic medication. -No changes to treatment plan at this time.  Vaccinations Patient declined flu shot but is considering pneumonia vaccine. Discussed the benefits and potential side effects of the pneumonia vaccine. -Encourage patient to consider getting the pneumonia vaccine.  General Health Maintenance -Continue current medications. Refills for pantoprazole and Singulair have been added. -Plan for follow-up in six months. -If a new cholesterol medication is started, a complete blood panel may need to be repeated at the next visit.         Orders Placed This Encounter  Procedures   CT CARDIAC SCORING (SELF PAY ONLY)    Standing Status:   Future    Standing Expiration Date:   01/31/2024    Order Specific Question:   Preferred imaging location?    Answer:   [facility]    Meds ordered this  encounter  Medications   pantoprazole (PROTONIX) 40 MG tablet    Sig: Take 1 tablet (40 mg total) by mouth daily before breakfast.    Dispense:  90 tablet    Refill:  3    Add future refills   carvedilol (COREG) 25 MG tablet    Sig: Take 1 tablet (25 mg total) by mouth 2 (two) times daily with a meal.    Dispense:  180 tablet    Refill:  3    Add future refills   amLODipine (NORVASC) 2.5 MG tablet    Sig: Take 1 tablet (2.5 mg total) by mouth daily.    Dispense:  180 tablet    Refill:  3    Add future refills     Follow up plan: Return for 6 month PreDM A1c.  Saralyn Pilar, DO [facility] 01/31/2023, 10:01 AM

## 2023-01-31 NOTE — Patient Instructions (Addendum)
Thank you for coming to the office today.  Recent Labs    07/12/22 0921 01/24/23 0850  HGBA1C 6.0* 6.0*   Consider Zetia or Repatha for cholesterol if needed.  You have been referred for a Coronary Calcium Score Cardiac CT Scan. This is a screening test for patients aged 69-50+ with cardiovascular risk factors or who are healthy but would be interested in Cardiovascular Screening for heart disease. Even if there is a family history of heart disease, this imaging can be useful. Typically it can be done every 5+ years or at a different timeline we agree on  The scan will look at the chest and mainly focus on the heart and identify early signs of calcium build up or blockages within the heart arteries. It is not 100% accurate for identifying blockages or heart disease, but it is useful to help [place] predict who may have some early changes or be at risk in the future for a heart attack or cardiovascular problem.  The results are reviewed by a Cardiologist and they will document the results. It should become available on MyChart. Typically the results are divided into percentiles based on other patients of the same demographic and age. So it will compare your risk to others similar to you. If you have a higher score >99 or higher percentile >75%tile, it is recommended to consider Statin cholesterol therapy and or referral to Cardiologist. I will try to help explain your results and if we have questions we can contact the Cardiologist.  You will be contacted for scheduling. Usually it is done at any imaging facility through [facility], [facility] or [facility].  The cost is $99 flat fee total and it does not go through insurance, so no authorization is required.   Please schedule a Follow-up Appointment to: Return for 6 month PreDM A1c.  If you have any other questions or concerns, please feel free to call the office or send a message through MyChart. You may also  schedule an earlier appointment if necessary.  Additionally, you may be receiving a survey about your experience at our office within a few days to 1 week by e-mail or mail. We value your feedback.  Saralyn Pilar, DO [facility], [place]

## 2023-01-31 NOTE — Assessment & Plan Note (Signed)
LDL 140, not at goal The 10-year ASCVD risk score (Arnett DK, et al., 2019) is: 10.5% Failed Rosuvastatin 5mg  and Atorvastatin 10mg  - myalgia  Plan: Defer med today, previously discussed Statins, Zetia, PCSK9 Pursue Coronary Calcium CT Score first and pending result, reconsider Zetia vs Repatha depending on diagnosis and coverage Encourage improved lifestyle - low carb/cholesterol, reduce portion size, continue improving regular exercise

## 2023-01-31 NOTE — Assessment & Plan Note (Signed)
Controlled Home BP cuff calibrated Followed by Card[facility] Renal artery dopplers negative No other underlying cause of HYPERTENSION identified OFF Lisinopril, HCTZ, Telmisartan   Plan:  1. Continue current BP regimen -  - Amlodipine 2.5mg  BID - Carvedilol 25mg  TWICE A DAY - Chlorthalidone 12.5mg  (half of 25mg ) daily  2. Encourage improved lifestyle - low sodium diet, regular exercise 3. Continue monitor BP outside office, bring readings to next visit, if persistently >140/90 or new symptoms notify office sooner

## 2023-01-31 NOTE — Assessment & Plan Note (Addendum)
Mild elevated A1c to 6.0, stable from prior Fam history diabetes  Plan:  1. Not on any therapy currently  2. Encourage improved lifestyle - low carb, low sugar diet, reduce portion size, continue improving regular exercise 3. Follow-up 6 mo with A1c

## 2023-02-06 ENCOUNTER — Ambulatory Visit
Admission: RE | Admit: 2023-02-06 | Discharge: 2023-02-06 | Disposition: A | Discharge status: Home / Self Care | Payer: PPO | Source: Ambulatory Visit | Attending: Family Medicine | Admitting: Family Medicine

## 2023-02-06 DIAGNOSIS — J3081 Allergic rhinitis due to animal (cat) (dog) hair and dander: Secondary | ICD-10-CM | POA: Diagnosis not present

## 2023-02-06 DIAGNOSIS — J301 Allergic rhinitis due to pollen: Secondary | ICD-10-CM | POA: Diagnosis not present

## 2023-02-06 DIAGNOSIS — J3089 Other allergic rhinitis: Secondary | ICD-10-CM | POA: Diagnosis not present

## 2023-02-06 DIAGNOSIS — E782 Mixed hyperlipidemia: Secondary | ICD-10-CM | POA: Insufficient documentation

## 2023-02-15 ENCOUNTER — Encounter: Payer: Self-pay | Admitting: Family Medicine

## 2023-02-15 ENCOUNTER — Other Ambulatory Visit (HOSPITAL_BASED_OUTPATIENT_CLINIC_OR_DEPARTMENT_OTHER): Payer: Self-pay | Admitting: Cardiovascular Disease

## 2023-02-15 DIAGNOSIS — J3089 Other allergic rhinitis: Secondary | ICD-10-CM | POA: Diagnosis not present

## 2023-02-15 DIAGNOSIS — J3081 Allergic rhinitis due to animal (cat) (dog) hair and dander: Secondary | ICD-10-CM | POA: Diagnosis not present

## 2023-02-15 DIAGNOSIS — J301 Allergic rhinitis due to pollen: Secondary | ICD-10-CM | POA: Diagnosis not present

## 2023-02-15 MED ORDER — POTASSIUM CHLORIDE CRYS ER 20 MEQ PO TBCR: 20.0000 meq | EXTENDED_RELEASE_TABLET | Freq: Once | ORAL | 0 refills | Status: DC

## 2023-02-15 NOTE — Telephone Encounter (Signed)
Refill Klor-Con per request. Patient has appointment scheduled for 04/03/23.

## 2023-02-20 DIAGNOSIS — J3081 Allergic rhinitis due to animal (cat) (dog) hair and dander: Secondary | ICD-10-CM | POA: Diagnosis not present

## 2023-02-20 DIAGNOSIS — J301 Allergic rhinitis due to pollen: Secondary | ICD-10-CM | POA: Diagnosis not present

## 2023-02-20 DIAGNOSIS — J3089 Other allergic rhinitis: Secondary | ICD-10-CM | POA: Diagnosis not present

## 2023-03-20 DIAGNOSIS — J301 Allergic rhinitis due to pollen: Secondary | ICD-10-CM | POA: Diagnosis not present

## 2023-03-20 DIAGNOSIS — J3081 Allergic rhinitis due to animal (cat) (dog) hair and dander: Secondary | ICD-10-CM | POA: Diagnosis not present

## 2023-03-20 DIAGNOSIS — J3089 Other allergic rhinitis: Secondary | ICD-10-CM | POA: Diagnosis not present

## 2023-03-30 ENCOUNTER — Encounter (HOSPITAL_BASED_OUTPATIENT_CLINIC_OR_DEPARTMENT_OTHER): Payer: Self-pay

## 2023-04-03 ENCOUNTER — Ambulatory Visit (HOSPITAL_BASED_OUTPATIENT_CLINIC_OR_DEPARTMENT_OTHER): Payer: PPO | Admitting: Family

## 2023-04-03 ENCOUNTER — Encounter (HOSPITAL_BASED_OUTPATIENT_CLINIC_OR_DEPARTMENT_OTHER): Payer: Self-pay | Admitting: Family

## 2023-04-03 VITALS — BP 120/78 | HR 71 | Ht 60.0 in | Wt 155.0 lb

## 2023-04-03 DIAGNOSIS — I10 Essential (primary) hypertension: Secondary | ICD-10-CM | POA: Diagnosis not present

## 2023-04-03 DIAGNOSIS — E876 Hypokalemia: Secondary | ICD-10-CM | POA: Diagnosis not present

## 2023-04-03 DIAGNOSIS — G4733 Obstructive sleep apnea (adult) (pediatric): Secondary | ICD-10-CM

## 2023-04-03 DIAGNOSIS — E785 Hyperlipidemia, unspecified: Secondary | ICD-10-CM

## 2023-04-03 DIAGNOSIS — J301 Allergic rhinitis due to pollen: Secondary | ICD-10-CM | POA: Diagnosis not present

## 2023-04-03 DIAGNOSIS — J3089 Other allergic rhinitis: Secondary | ICD-10-CM | POA: Diagnosis not present

## 2023-04-03 DIAGNOSIS — I25118 Atherosclerotic heart disease of native coronary artery with other forms of angina pectoris: Secondary | ICD-10-CM

## 2023-04-03 DIAGNOSIS — I6523 Occlusion and stenosis of bilateral carotid arteries: Secondary | ICD-10-CM

## 2023-04-03 DIAGNOSIS — J3081 Allergic rhinitis due to animal (cat) (dog) hair and dander: Secondary | ICD-10-CM | POA: Diagnosis not present

## 2023-04-03 MED ORDER — AMLODIPINE BESYLATE 2.5 MG PO TABS: 2.5000 mg | ORAL_TABLET | Freq: Two times a day (BID) | ORAL | 3 refills | Status: AC

## 2023-04-03 MED ORDER — POTASSIUM CHLORIDE CRYS ER 20 MEQ PO TBCR
20.0000 meq | EXTENDED_RELEASE_TABLET | Freq: Every day | ORAL | 1 refills | Status: AC
Start: 2023-04-03 — End: ?

## 2023-04-03 NOTE — Patient Instructions (Addendum)
Medication Instructions:  Continue current medications.   Could consider Repatha or Incliseron Wilber Bihari) for your cholesterol.  Could apply for [employer].   *If you need a refill on your cardiac medications before your next appointment, please call your pharmacy*  Lab Work: Consider lipoprotein (a) with your next labs with Dr. Kirtland Bouchard.   Testing/Procedures: Your EKG looked good!   Follow-Up: At [facility], you and your health needs are our priority.  As part of our continuing mission to provide you with exceptional heart care, we have created designated Provider Care Teams.  These Care Teams include your primary Cardiologist (physician) and Advanced Practice Providers (APPs -  Physician Assistants and Nurse Practitioners) who all work together to provide you with the care you need, when you need it.  We recommend signing up for the patient portal called "MyChart".  Sign up information is provided on this After Visit Summary.  MyChart is used to connect with patients for Virtual Visits (Telemedicine).  Patients are able to view lab/test results, encounter notes, upcoming appointments, etc.  Non-urgent messages can be sent to your provider as well.   To learn more about what you can do with MyChart, go to [email or web address].    Your next appointment:   1 year(s)  Provider:   Alver Sorrow, NP in Hy[facility]ension Clinic  Other Instructions  Natural remedies have not been found to be effective in lowering cholesterol and preventing progression of aortic atherosclerosis. I've linked a good article below for you if you are interested. It found that low dose statin was significant in reducing cholesterol compared to a number of supplements.   [email or web address]

## 2023-04-03 NOTE — Progress Notes (Signed)
[facility] Assessment:    Date:  04/03/2023   ID:  Stacie Ellis, Stacie Ellis Dec 06, 1955, MRN [id number]  PCP:  Stacie Cords, DO  Cardiologist:  None  Nephrologist:  Referring MD: Stacie Ellis *   CC: Hypertension  History of Present Illness:    Stacie Ellis is a 68 y.o. female with a hx of hypertension, carotid stenosis, GERD. Family history of hypertension in both her father and brother who were diagnosed in their 28s.  12/2019 carotid duplex bilateral <50% stenosis with minimal plaque. Established with [facility] 08/2021 Telmisartan stopped as she reported hoarseness. Hydrochlorothiazide transitioned to Chlorthlaidone. Had hypokalemia associated with cramping and Chlorthalidone reduced. 10/2021 renal artery duplex with no stenosis. Sleep study revealed mild OSA with AHI 14.7/hr recommended for CPAP. She wished to think about it.   Calcium score 02/2023 of 176 all in LAD placing her in 82nd percentile for age/sex. Previously intolerant to Rosuvastatin, Atorvastatin.   Presents today for follow up. BP at home most often <130/80. Feeling overall well. Reports no shortness of breath nor dyspnea on exertion. Reports no chest pain, pressure, or tightness. No edema, orthopnea, PND. Reports no palpitations.  We reviewed calcium score and benefit of Repatha or Incliseron. Low suspicion Zetia would get her to goal LDL <70.  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 left breast lumpectomy, wide  excision and a repeat wide excision on 03/10/2010 for multiple positive margins on original resection   Malignant neoplasm of upper-outer quadrant of female breast [facility]) January 2012:    Re-excision to negative margins.    Obesity, unspecified    Postmenopausal bleeding 2013   S/P radiation therapy 2012   whole breast,    Special screening for malignant neoplasms, colon     Past Surgical History:  Procedure Laterality Date   APPENDECTOMY  2008   BREAST BIOPSY Left 2011   pt states had stereo done on mobile unit at Dr. Rutherford Nail office. DCIS   BREAST LUMPECTOMY Left 02/2010   lumpectomy with re excision 03/2010 of left breast for cancer and rad tx   BREAST SURGERY Left 02/17/2010;03/10/2010   lumpectomy and repeat wide excision   CESAREAN SECTION  1998   COLON SURGERY Right 06/13/2006   Hand-assisted right hemicolectomy. 5.8 cm villous adenoma without atypia   COLONOSCOPY  2015   Normal exam.   COLONOSCOPY WITH PROPOFOL N/A 05/29/2019   Procedure: COLONOSCOPY WITH PROPOFOL;  Surgeon: Midge Minium, MD;  Location[facility] ENDOSCOPY;  Service: Endoscopy;  Laterality: N/A;   ESOPHAGOGASTRODUODENOSCOPY (EGD) WITH PROPOFOL N/A 06/21/2015   Procedure: ESOPHAGOGASTRODUODENOSCOPY (EGD) WITH PROPOFOL;  Surgeon: Wallace Cullens, MD;  Location: [facility] ENDOSCOPY;  Service: Gastroenterology;  Laterality: N/A;    Current Medications: Current Meds  Medication Sig   albuterol (PROVENTIL HFA;VENTOLIN HFA) 108 (90 Base) MCG/ACT inhaler Inhale 2 puffs into the lungs every 6 (six) hours as needed for wheezing or shortness of breath.   amLODipine (NORVASC) 2.5 MG tablet Take 1 tablet (2.5 mg total) by mouth daily.   aspirin 81 MG tablet Take 81 mg by mouth daily.  carvedilol (COREG) 25 MG tablet Take 1 tablet (25 mg total) by mouth 2 (two) times daily with a meal.   chlorthalidone (HYGROTON) 25 MG tablet Take 0.5 tablets (12.5 mg total) by mouth daily.   Cholecalciferol (VITAMIN D3) 50 MCG (2000 UT) capsule Take 2  capsules (4,000 Units total) by mouth daily.   EPINEPHrine 0.3 mg/0.3 mL IJ SOAJ injection See admin instructions.   fluticasone (FLONASE) 50 MCG/ACT nasal spray 1-2 sprays   fluticasone furoate-vilanterol (BREO ELLIPTA) 100-25 MCG/ACT AEPB Inhale 1 puff into the lungs daily.   fluticasone furoate-vilanterol (BREO ELLIPTA) 100-25 MCG/ACT AEPB Inhale 1 puff into the lungs daily.   halobetasol (ULTRAVATE) 0.05 % cream Apply topically daily.   loratadine (CLARITIN) 10 MG tablet Take 10 mg by mouth daily.   Magnesium Glycinate 100 MG CAPS Take 200 mg by mouth daily.   montelukast (SINGULAIR) 10 MG tablet TAKE 1 TABLET(10 MG) BY MOUTH AT BEDTIME   pantoprazole (PROTONIX) 40 MG tablet Take 1 tablet (40 mg total) by mouth daily before breakfast.   Spacer/Aero-Holding Chambers (AEROCHAMBER MV) inhaler See admin instructions.     Allergies:   Levaquin [levofloxacin] and Penicillins   Social History   Socioeconomic History   Marital status: Married    Spouse name: Not on file   Number of children: Not on file   Years of education: Not on file   Highest education level: Not on file  Occupational History   Not on file  Tobacco Use   Smoking status: Never   Smokeless tobacco: Never  Vaping Use   Vaping status: Never Used  Substance and Sexual Activity   Alcohol use: No   Drug use: No   Sexual activity: Yes    Birth control/protection: Post-menopausal  Other Topics Concern   Not on file  Social History Narrative   Not on file   Social Drivers of Health   Financial Resource Strain: Low Risk  (12/15/2022)   Overall Financial Resource Strain (CARDIA)    Difficulty of Paying Living Expenses: Not hard at all  Food Insecurity: No Food Insecurity (12/15/2022)   Hunger Vital Sign    Worried About Running Out of Food in the Last Year: Never true    Ran Out of Food in the Last Year: Never true  Transportation Needs: No Transportation Needs (12/15/2022)   PRAPARE - Scientist, research (physical sciences) (Medical): No    Lack of Transportation (Non-Medical): No  Physical Activity: Insufficiently Active (12/15/2022)   Exercise Vital Sign    Days of Exercise per Week: 3 days    Minutes of Exercise per Session: 30 min  Stress: No Stress Concern Present (12/15/2022)   [employer] of Occupational Health - Occupational Stress Questionnaire    Feeling of Stress : Not at all  Social Connections: Moderately Integrated (12/15/2022)   Social Connection and Isolation Panel [NHANES]    Frequency of Communication with Friends and Family: More than three times a week    Frequency of Social Gatherings with Friends and Family: More than three times a week    Attends Religious Services: More than 4 times per year    Active Member of [employer] or Organizations: No    Attends Engineer, structural: Never    Marital Status: Married     Family History: The patient's family history includes Cancer in her father and another family member; Colon polyps in an other family member; Hypertension in her brother and father. There is no  history of Breast cancer.  ROS:   Please see the history of present illness.     All other systems reviewed and are negative.  EKGs/Labs/Other Studies Reviewed:         Recent Labs: 01/24/2023: ALT 15; BUN 13; Creat 0.66; Hemoglobin 14.2; Platelets 231; Potassium 3.6; Sodium 140; TSH 3.21   Recent Lipid Panel    Component Value Date/Time   CHOL 228 (H) 01/24/2023 0850   CHOL 229 (H) 12/08/2020 1136   TRIG 129 01/24/2023 0850   HDL 63 01/24/2023 0850   HDL 62 12/08/2020 1136   CHOLHDL 3.6 01/24/2023 0850   LDLCALC 140 (H) 01/24/2023 0850    Physical Exam:   VS:  Pulse 71   Ht 5' (1.524 m)   Wt 155 lb (70.3 kg)   SpO2 96%   BMI 30.27 kg/m  , BMI Body mass index is 30.27 kg/m. GENERAL:  Well appearing HEENT: Pupils equal round and reactive, fundi not visualized, oral mucosa unremarkable NECK:  No jugular venous distention, waveform within  normal limits, carotid upstroke brisk and symmetric, no bruits, no thyromegaly LYMPHATICS:  No cervical adenopathy LUNGS:  Clear to auscultation bilaterally HEART:  RRR.  PMI not displaced or sustained,S1 and S2 within normal limits, no S3, no S4, no clicks, no rubs,  murmurs ABD:  Flat, positive bowel sounds normal in frequency in pitch, no bruits, no rebound, no guarding, no midline pulsatile mass, no hepatomegaly, no splenomegaly EXT:  2 plus pulses throughout, no edema, no cyanosis no clubbing SKIN:  No rashes no nodules NEURO:  Cranial nerves II through XII grossly intact, motor grossly intact throughout PSYCH:  Cognitively intact, oriented to person place and time   ASSESSMENT/PLAN:    HTN - Diagnosed in her 17s.  BP controlled, continue Amlodipine 2.5mg  daily, Coreg 25mg  BID, Chlorthalidone 25mg  daily.   Hypokalemia - POtassium refill provided.  OSA - 08/2021 sleep study mild OSA with AHI 14.7/hr. Previously wished to think about CPAP, has not elected to start.   CAD / HLD, LDL goal <70 - Stable with no anginal symptoms. No indication for ischemic evaluation.  02/06/23 calcium score 176 placing her in 82nd percentile. Continue aspirin. Has not tolerated multiple statins. Discussed unlikely Zetia would get her LDL to goal <70. Nexlizet is $100 per month with her insurance which is cost prohibitive. Discussed PCSK9i (Repatha) and Leqvio. She wishes to consider and will contact us or PCP if interested.   Carotid artery stenosis -prior carotid duplex October 2021 with bilateral less than 50% stenosis with minimal plaque formation on the left and right.  No lightheadedness, dizziness, amaurosis fugax.   Screening for Secondary Hypertension:     08/25/2021    2:09 PM  Causes  Renovascular HTN Screened     - Comments Renal duplex ordered 08/25/2021  Sleep Apnea Screened     - Comments Home sleep study 08/25/21  Thyroid Disease Screened  Hyperaldosteronism Screened     - Comments Renal  and aldosterone ordered 08/25/2021  Cushing's Syndrome N/A     - Comments Non cushingoid appearance  Coarctation of the Aorta Screened     - Comments 12/2019 carotid duplex  Compliance Screened    Relevant Labs/Studies:    Latest Ref Rng & Units 01/24/2023    8:50 AM 01/11/2022    8:59 AM 11/22/2021    1:33 PM  Basic Labs  Sodium 135 - 146 mmol/L 140  141  141   Potassium 3.5 - 5.3 mmol/L  3.6  3.7  3.7   Creatinine 0.50 - 1.05 mg/dL 1.61  0.96  0.45        Latest Ref Rng & Units 01/24/2023    8:50 AM 12/08/2020   11:36 AM  Thyroid   TSH 0.40 - 4.50 mIU/L 3.21  1.590        Latest Ref Rng & Units 09/02/2021   11:42 AM  Renin/Aldosterone   Aldosterone 0.0 - 30.0 ng/dL 40.9              10/04/1912   10:09 AM  Renovascular   Renal Artery [place] Completed Yes     Disposition:    FU with MD/PharmD/APP in 1 year   Medication Adjustments/Labs and Tests Ordered: Current medicines are reviewed at length with the patient today.  Concerns regarding medicines are outlined above.  Orders Placed This Encounter  Procedures   EKG 12-Lead   No orders of the defined types were placed in this encounter.    Signed, Alver Sorrow, NP  04/03/2023 11:42 AM    [facility]

## 2023-04-07 ENCOUNTER — Encounter (HOSPITAL_BASED_OUTPATIENT_CLINIC_OR_DEPARTMENT_OTHER): Payer: Self-pay | Admitting: Family

## 2023-04-10 DIAGNOSIS — I10 Essential (primary) hypertension: Secondary | ICD-10-CM | POA: Diagnosis not present

## 2023-04-10 DIAGNOSIS — J3081 Allergic rhinitis due to animal (cat) (dog) hair and dander: Secondary | ICD-10-CM | POA: Diagnosis not present

## 2023-04-10 DIAGNOSIS — J4530 Mild persistent asthma, uncomplicated: Secondary | ICD-10-CM | POA: Diagnosis not present

## 2023-04-10 DIAGNOSIS — H2513 Age-related nuclear cataract, bilateral: Secondary | ICD-10-CM | POA: Diagnosis not present

## 2023-04-10 DIAGNOSIS — J3089 Other allergic rhinitis: Secondary | ICD-10-CM | POA: Diagnosis not present

## 2023-04-10 DIAGNOSIS — J301 Allergic rhinitis due to pollen: Secondary | ICD-10-CM | POA: Diagnosis not present

## 2023-04-25 ENCOUNTER — Encounter: Payer: Self-pay | Admitting: Family Medicine

## 2023-04-25 ENCOUNTER — Encounter (HOSPITAL_BASED_OUTPATIENT_CLINIC_OR_DEPARTMENT_OTHER): Payer: Self-pay

## 2023-04-25 ENCOUNTER — Other Ambulatory Visit (HOSPITAL_BASED_OUTPATIENT_CLINIC_OR_DEPARTMENT_OTHER): Payer: Self-pay | Admitting: Family

## 2023-04-25 DIAGNOSIS — I10 Essential (primary) hypertension: Secondary | ICD-10-CM

## 2023-04-26 DIAGNOSIS — J3089 Other allergic rhinitis: Secondary | ICD-10-CM | POA: Diagnosis not present

## 2023-04-26 DIAGNOSIS — J3081 Allergic rhinitis due to animal (cat) (dog) hair and dander: Secondary | ICD-10-CM | POA: Diagnosis not present

## 2023-04-26 DIAGNOSIS — J301 Allergic rhinitis due to pollen: Secondary | ICD-10-CM | POA: Diagnosis not present

## 2023-04-26 MED ORDER — CHLORTHALIDONE 25 MG PO TABS: 12.5000 mg | ORAL_TABLET | Freq: Every day | ORAL | 3 refills | Status: AC

## 2023-05-02 ENCOUNTER — Telehealth: Payer: PPO | Admitting: Family Medicine

## 2023-05-02 DIAGNOSIS — J301 Allergic rhinitis due to pollen: Secondary | ICD-10-CM | POA: Diagnosis not present

## 2023-05-10 DIAGNOSIS — J3089 Other allergic rhinitis: Secondary | ICD-10-CM | POA: Diagnosis not present

## 2023-05-10 DIAGNOSIS — J3081 Allergic rhinitis due to animal (cat) (dog) hair and dander: Secondary | ICD-10-CM | POA: Diagnosis not present

## 2023-05-10 DIAGNOSIS — J301 Allergic rhinitis due to pollen: Secondary | ICD-10-CM | POA: Diagnosis not present

## 2023-05-24 DIAGNOSIS — J3081 Allergic rhinitis due to animal (cat) (dog) hair and dander: Secondary | ICD-10-CM | POA: Diagnosis not present

## 2023-05-24 DIAGNOSIS — J301 Allergic rhinitis due to pollen: Secondary | ICD-10-CM | POA: Diagnosis not present

## 2023-05-24 DIAGNOSIS — J3089 Other allergic rhinitis: Secondary | ICD-10-CM | POA: Diagnosis not present

## 2023-06-12 DIAGNOSIS — J301 Allergic rhinitis due to pollen: Secondary | ICD-10-CM | POA: Diagnosis not present

## 2023-06-12 DIAGNOSIS — J3081 Allergic rhinitis due to animal (cat) (dog) hair and dander: Secondary | ICD-10-CM | POA: Diagnosis not present

## 2023-06-12 DIAGNOSIS — J3089 Other allergic rhinitis: Secondary | ICD-10-CM | POA: Diagnosis not present

## 2023-06-13 DIAGNOSIS — H43811 Vitreous degeneration, right eye: Secondary | ICD-10-CM | POA: Diagnosis not present

## 2023-06-26 DIAGNOSIS — J301 Allergic rhinitis due to pollen: Secondary | ICD-10-CM | POA: Diagnosis not present

## 2023-06-26 DIAGNOSIS — J3081 Allergic rhinitis due to animal (cat) (dog) hair and dander: Secondary | ICD-10-CM | POA: Diagnosis not present

## 2023-06-26 DIAGNOSIS — J3089 Other allergic rhinitis: Secondary | ICD-10-CM | POA: Diagnosis not present

## 2023-07-04 ENCOUNTER — Other Ambulatory Visit: Payer: Self-pay | Admitting: Family Medicine

## 2023-07-07 NOTE — Telephone Encounter (Signed)
 Last OV 01/31/23 Requested Prescriptions  Pending Prescriptions Disp Refills   montelukast  (SINGULAIR ) 10 MG tablet [Pharmacy Med Name: MONTELUKAST  10MG  TABLETS] 90 tablet 2    Sig: TAKE 1 TABLET(10 MG) BY MOUTH AT BEDTIME     Pulmonology:  Leukotriene Inhibitors Failed - 07/07/2023  1:02 AM      Failed - Valid encounter within last 12 months    Recent Outpatient Visits   None     Future Appointments             In 1 month Karamalegos, Kayleen Party, DO [facility], [facility]

## 2023-07-10 DIAGNOSIS — J3081 Allergic rhinitis due to animal (cat) (dog) hair and dander: Secondary | ICD-10-CM | POA: Diagnosis not present

## 2023-07-10 DIAGNOSIS — J301 Allergic rhinitis due to pollen: Secondary | ICD-10-CM | POA: Diagnosis not present

## 2023-07-10 DIAGNOSIS — J3089 Other allergic rhinitis: Secondary | ICD-10-CM | POA: Diagnosis not present

## 2023-07-24 DIAGNOSIS — J3089 Other allergic rhinitis: Secondary | ICD-10-CM | POA: Diagnosis not present

## 2023-07-24 DIAGNOSIS — J301 Allergic rhinitis due to pollen: Secondary | ICD-10-CM | POA: Diagnosis not present

## 2023-07-24 DIAGNOSIS — J3081 Allergic rhinitis due to animal (cat) (dog) hair and dander: Secondary | ICD-10-CM | POA: Diagnosis not present

## 2023-07-25 DIAGNOSIS — H43811 Vitreous degeneration, right eye: Secondary | ICD-10-CM | POA: Diagnosis not present

## 2023-07-31 ENCOUNTER — Other Ambulatory Visit: Payer: Self-pay | Admitting: Family Medicine

## 2023-07-31 DIAGNOSIS — E782 Mixed hyperlipidemia: Secondary | ICD-10-CM | POA: Diagnosis not present

## 2023-07-31 DIAGNOSIS — I25118 Atherosclerotic heart disease of native coronary artery with other forms of angina pectoris: Secondary | ICD-10-CM | POA: Diagnosis not present

## 2023-07-31 DIAGNOSIS — J3089 Other allergic rhinitis: Secondary | ICD-10-CM | POA: Diagnosis not present

## 2023-07-31 DIAGNOSIS — J301 Allergic rhinitis due to pollen: Secondary | ICD-10-CM | POA: Diagnosis not present

## 2023-07-31 DIAGNOSIS — J3081 Allergic rhinitis due to animal (cat) (dog) hair and dander: Secondary | ICD-10-CM | POA: Diagnosis not present

## 2023-07-31 DIAGNOSIS — R7303 Prediabetes: Secondary | ICD-10-CM

## 2023-07-31 DIAGNOSIS — I10 Essential (primary) hypertension: Secondary | ICD-10-CM

## 2023-08-01 ENCOUNTER — Ambulatory Visit: Payer: Self-pay | Admitting: Family Medicine

## 2023-08-04 LAB — HEMOGLOBIN A1C
Hgb A1c MFr Bld: 6.1 % — ABNORMAL HIGH (ref <5.7)
Mean Plasma Glucose: 128 mg/dL
eAG (mmol/L): 7.1 mmol/L

## 2023-08-04 LAB — LIPID PANEL
Cholesterol: 199 mg/dL (ref <200)
HDL: 60 mg/dL (ref >=50)
LDL Cholesterol (Calc): 120 mg/dL — ABNORMAL HIGH
Non-HDL Cholesterol (Calc): 139 mg/dL — ABNORMAL HIGH (ref <130)
Total CHOL/HDL Ratio: 3.3 (calc) (ref <5.0)
Triglycerides: 88 mg/dL (ref <150)

## 2023-08-04 LAB — C-REACTIVE PROTEIN: CRP: 5.3 mg/L (ref <8.0)

## 2023-08-04 LAB — LIPOPROTEIN A (LPA): Lipoprotein (a): <10 nmol/L (ref <75)

## 2023-08-08 ENCOUNTER — Ambulatory Visit (INDEPENDENT_AMBULATORY_CARE_PROVIDER_SITE_OTHER): Admitting: Family Medicine

## 2023-08-08 ENCOUNTER — Encounter: Payer: Self-pay | Admitting: Family Medicine

## 2023-08-08 ENCOUNTER — Other Ambulatory Visit: Payer: Self-pay | Admitting: Family Medicine

## 2023-08-08 VITALS — BP 124/70 | HR 71 | Ht 60.0 in | Wt 145.1 lb

## 2023-08-08 DIAGNOSIS — I10 Essential (primary) hypertension: Secondary | ICD-10-CM

## 2023-08-08 DIAGNOSIS — I25118 Atherosclerotic heart disease of native coronary artery with other forms of angina pectoris: Secondary | ICD-10-CM

## 2023-08-08 DIAGNOSIS — R7303 Prediabetes: Secondary | ICD-10-CM

## 2023-08-08 DIAGNOSIS — L209 Atopic dermatitis, unspecified: Secondary | ICD-10-CM | POA: Diagnosis not present

## 2023-08-08 DIAGNOSIS — Z0000 Encounter for general adult medical examination without abnormal findings: Secondary | ICD-10-CM

## 2023-08-08 DIAGNOSIS — E782 Mixed hyperlipidemia: Secondary | ICD-10-CM

## 2023-08-08 DIAGNOSIS — Z9012 Acquired absence of left breast and nipple: Secondary | ICD-10-CM | POA: Diagnosis not present

## 2023-08-08 DIAGNOSIS — E538 Deficiency of other specified B group vitamins: Secondary | ICD-10-CM

## 2023-08-08 MED ORDER — HALOBETASOL PROPIONATE 0.05 % EX OINT: TOPICAL_OINTMENT | Freq: Two times a day (BID) | CUTANEOUS | 2 refills | Status: AC

## 2023-08-08 NOTE — Progress Notes (Signed)
 Subjective:    Patient ID: Stacie Ellis, female    DOB: December 19, 1955, 68 y.o.   MRN: [id number]  Stacie Ellis is a 68 y.o. female presenting on 08/08/2023 for Hypertension and Pre-Diabetes   HPI  Discussed the use of AI scribe software for clinical note transcription with the patient, who gave verbal consent to proceed.  History of Present Illness   Stacie Stacie Steve "Stacie" is a 68 year old female who presents for a six-month follow-up to review lab results and manage psoriasis.   History Mastectomy Atopic Dermatitis vs Psoriasis Will need new order for breast prosthetic. She will initiate with medical supply store, previously w [facility], and send us  order to sign.   She has a history of breast cancer and subsequent radiation therapy, which she believes may be linked to her recurring psoriasis. She experiences small psoriasis spots on her skin and has previously used halobetasol ointment, which she found effective. She is currently using a cream that is not as effective and requests a refill of the halobetasol ointment.    HYPERLIPIDEMIA: She has been actively working on lifestyle changes to manage her cholesterol levels, Last lipid panel 07/2023 resulting in a decrease in total cholesterol from 228 to 199 and LDL from 140 to 120. She has also lost approximately 12 to 14 pounds over the past six months. - Additional labs with CRP 5.3, Lipoprotein (a) < 10 reassurance with low risk. - Failed prior statin with myopathy - Rosuvastatin  5mg  and Atorvastatin 10mg  - Currently NOT on statin. On supplements CoQ10, Fish Oil, Red Yeast Rice, Niacin    Pre-Diabetes Obesity BMI >28 A1c 6.1 (stable 6.0 for past 1.5 years) - weight down 15 lbs in past 6 month She has strong fam history of DIABETES Not on med   Ventral Hernia History of partial colectomy due to pre-cancerous polyps. Done 2008 Questioning future hernia repair   PMH GERD / Hoarseness of Voice On PPI  Pantoprazole  40mg  daily, has some breakthrough symptoms.    CHRONIC HTN: Followed by Cardiology [place] [facility] Has done well with med adjustments overall Prior work up and eval done. She has been on BP medication since age 26s She has significant fam history of father and older brother with HTN diagnosed in their 33s. Her BP cuff similar to our readings  Current Meds - Amlodipine  2.5mg  BID - Carvedilol  25mg  TWICE A DAY - Chlorthalidone  12.5mg  (half of 25mg ) daily OFF Lisinopril , HCTZ, Telmisartan    Reports good compliance, took meds today. Tolerating well, w/o complaints. Denies CP, dyspnea, HA, edema, dizziness / lightheadedness     Health Maintenance:   Mammogram 09/05/22 negative   Future Pneumonia vaccine Prevnar-20      08/08/2023    2:31 PM 01/31/2023    9:34 AM 12/15/2022   10:47 AM  Depression screen PHQ 2/9  Decreased Interest 0 0 0  Down, Depressed, Hopeless 0 0 0  PHQ - 2 Score 0 0 0  Altered sleeping 0 0 0  Tired, decreased energy 0 0 0  Change in appetite 0 0 0  Feeling bad or failure about yourself  0 0 0  Trouble concentrating 0 0 0  Moving slowly or fidgety/restless 0 0 0  Suicidal thoughts 0 0 0  PHQ-9 Score 0 0 0  Difficult doing work/chores  Not difficult at all Not difficult at all       08/08/2023    2:31 PM 01/31/2023    9:34 AM  06/02/2020   10:45 AM  GAD 7 : Generalized Anxiety Score  Nervous, Anxious, on Edge 0 0 0  Control/stop worrying 0 0 0  Worry too much - different things 0 0 0  Trouble relaxing 0 0 0  Restless 0 0 0  Easily annoyed or irritable 0 0 0  Afraid - awful might happen 0 0 0  Total GAD 7 Score 0 0 0  Anxiety Difficulty  Not difficult at all Not difficult at all    Social History   Tobacco Use   Smoking status: Never   Smokeless tobacco: Never  Vaping Use   Vaping status: Never Used  Substance Use Topics   Alcohol use: No   Drug use: No    Review of Systems Per HPI unless specifically indicated  above     Objective:     BP 124/70 (BP Location: Right Arm, Patient Position: Sitting, Cuff Size: Normal)   Pulse 71   Ht 5' (1.524 m)   Wt 145 lb 2 oz (65.8 kg)   SpO2 96%   BMI 28.34 kg/m   Wt Readings from Last 3 Encounters:  08/08/23 145 lb 2 oz (65.8 kg)  04/03/23 155 lb (70.3 kg)  01/31/23 159 lb 12.8 oz (72.5 kg)    Physical Exam Vitals and nursing note reviewed.  Constitutional:      General: She is not in acute distress.    Appearance: Normal appearance. She is well-developed. She is not diaphoretic.     Comments: Well-appearing, comfortable, cooperative  HENT:     Head: Normocephalic and atraumatic.  Eyes:     General:        Right eye: No discharge.        Left eye: No discharge.     Conjunctiva/sclera: Conjunctivae normal.  Cardiovascular:     Rate and Rhythm: Normal rate.  Pulmonary:     Effort: Pulmonary effort is normal.  Skin:    General: Skin is warm and dry.     Findings: No erythema or rash.  Neurological:     Mental Status: She is alert and oriented to person, place, and time.  Psychiatric:        Mood and Affect: Mood normal.        Behavior: Behavior normal.        Thought Content: Thought content normal.     Comments: Well groomed, good eye contact, normal speech and thoughts     Results for orders placed or performed in visit on 07/31/23  Hemoglobin A1c   Collection Time: 07/31/23 10:37 AM  Result Value Ref Range   Hgb A1c MFr Bld 6.1 (H) <5.7 %   Mean Plasma Glucose 128 mg/dL   eAG (mmol/L) 7.1 mmol/L  Lipid panel   Collection Time: 07/31/23 10:37 AM  Result Value Ref Range   Cholesterol 199 <200 mg/dL   HDL 60 > OR = 50 mg/dL   Triglycerides 88 <130 mg/dL   LDL Cholesterol (Calc) 120 (H) mg/dL (calc)   Total CHOL/HDL Ratio 3.3 <5.0 (calc)   Non-HDL Cholesterol (Calc) 139 (H) <130 mg/dL (calc)  Lipoprotein A (LPA)   Collection Time: 07/31/23 10:37 AM  Result Value Ref Range   Lipoprotein (a) <10 <75 nmol/L  C-reactive  protein   Collection Time: 07/31/23 10:37 AM  Result Value Ref Range   CRP 5.3 <8.0 mg/L      Assessment & Plan:   Problem List Items Addressed This Visit     Coronary  artery disease of native artery of native heart with stable angina pectoris (HCC)   Essential hypertension   History of left mastectomy   Mixed hyperlipidemia   Pre-diabetes - Primary   Other Visit Diagnoses       Atopic dermatitis, unspecified type       Relevant Medications   halobetasol (ULTRAVATE) 0.05 % ointment        Psoriasis / Atopic Dermatitis Current triamcinolone cream ineffective. Halobetasol ointment preferred for its effectiveness despite cost. - Prescribed halobetasol ointment with refills.  Hyperlipidemia CAD LDL decreased from 140 to 161 mg/dL, total cholesterol from 228 to 199 mg/dL due to lifestyle changes and supplements. Statins not used due to intolerance. - Continue lifestyle modifications. - Continue supplements Fish Oil, CoQ10, Red Yeast Rice, Niacin if no side effects or financial burden.  Prediabetes HbA1c increased from 6.0% to 6.1%. No medication recommended, focus on monitoring trends. - Monitor HbA1c levels. - Maintain lifestyle modifications.  Follow-up - Schedule yearly check-up for early December. - Arrange lab work prior to check-up, preferably Tuesday or Wednesday morning.       No orders of the defined types were placed in this encounter.   Meds ordered this encounter  Medications   halobetasol (ULTRAVATE) 0.05 % ointment    Sig: Apply topically 2 (two) times daily. As needed for dermatitis    Dispense:  15 g    Refill:  2    Follow up plan: Return in about 6 months (around 02/07/2024) for 6 month fasting lab > 1 week later Annual Physical.  Future labs ordered for 02/12/24  Domingo Friend, DO [facility] 08/08/2023, 2:57 PM

## 2023-08-08 NOTE — Patient Instructions (Addendum)
 Thank you for coming to the office today.  Keep up the cholesterol supplements  Good news with results overall  Recent Labs    01/24/23 0850 07/31/23 1037  HGBA1C 6.0* 6.1*    Refilled Halobetasol ointment use twice a day as needed  DUE for FASTING BLOOD WORK (no food or drink after midnight before the lab appointment, only water or coffee without cream/sugar on the morning of)  SCHEDULE "Lab Only" visit in the morning at the clinic for lab draw in 6 MONTHS   - Make sure Lab Only appointment is at about 1 week before your next appointment, so that results will be available  For Lab Results, once available within 2-3 days of blood draw, you can can log in to MyChart online to view your results and a brief explanation. Also, we can discuss results at next follow-up visit.    Please schedule a Follow-up Appointment to: Return in about 6 months (around 02/07/2024) for 6 month fasting lab > 1 week later Annual Physical.  If you have any other questions or concerns, please feel free to call the office or send a message through MyChart. You may also schedule an earlier appointment if necessary.  Additionally, you may be receiving a survey about your experience at our office within a few days to 1 week by e-mail or mail. We value your feedback.  Domingo Friend, DO [facility], [place]

## 2023-08-14 DIAGNOSIS — J3089 Other allergic rhinitis: Secondary | ICD-10-CM | POA: Diagnosis not present

## 2023-08-14 DIAGNOSIS — J3081 Allergic rhinitis due to animal (cat) (dog) hair and dander: Secondary | ICD-10-CM | POA: Diagnosis not present

## 2023-08-14 DIAGNOSIS — J301 Allergic rhinitis due to pollen: Secondary | ICD-10-CM | POA: Diagnosis not present

## 2023-08-18 ENCOUNTER — Ambulatory Visit
Admission: EM | Admit: 2023-08-18 | Discharge: 2023-08-18 | Disposition: A | Discharge status: Home / Self Care | Attending: Family Medicine | Admitting: Family Medicine

## 2023-08-18 DIAGNOSIS — H01119 Allergic dermatitis of unspecified eye, unspecified eyelid: Secondary | ICD-10-CM

## 2023-08-18 NOTE — ED Triage Notes (Signed)
 Pt c/o R eye redness & itchiness x1 wk. Has tried pataday & eye patches w/o relief.

## 2023-08-18 NOTE — ED Provider Notes (Signed)
 [facility]    CSN: [id number] Arrival date & time: 08/18/23  1444      History   Chief Complaint Chief Complaint  Patient presents with   Eye Problem    HPI Stacie Ellis is a 68 y.o. female.  Presents for eyelid itching.  Patient reports a week of right upper eyelid redness and itching without swelling or drainage  Has had some right eye redness and drainage and itching which is consistent with her previous allergic conjunctiva symptoms.  She has been using Pataday.  She is also been using topical Vaseline to the eyelid which does give her some temporary relief.  She tried some over-the-counter eyelid cleansing wipes but it just burned.  States she had something similar in the past and was given some type of eyelid wipes by her ophthalmologist but she does not recall what they were.  States they were not prescription.  Denies any swelling or drainage around the eye.  No fevers or chills.  No other concerns at this time. Eye Problem   Past Medical History:  Diagnosis Date   Asthma    Breast cancer (HCC) 02/2010   GERD (gastroesophageal reflux disease) 2008   Hypertension    Malignant neoplasm of upper-outer quadrant of female breast (HCC) 2011   Intermediate grade DCIS, ER: 50%; PR 10%. left breast lumpectomy, wide excision and a repeat wide excision on 03/10/2010 for multiple positive margins on original resection   Malignant neoplasm of upper-outer quadrant of female breast [facility]) January 2012:    Re-excision to negative margins.    Obesity, unspecified    Postmenopausal bleeding 2013   S/P radiation therapy 2012   whole breast,    Special screening for malignant neoplasms, colon     Patient Active Problem List   Diagnosis Date Noted   History of left mastectomy 08/08/2023   Coronary artery disease of native artery of native heart with stable angina pectoris (HCC) 07/31/2023   Overweight (BMI 25.0-29.9) 07/19/2022   Allergic rhinitis 12/08/2021   Allergic  rhinitis due to animal (cat) (dog) hair and dander 12/08/2021   Mild persistent asthma, uncomplicated 12/08/2021   Encounter for prophylactic measures, unspecified 12/08/2021   Caregiver burden 08/03/2021   Localized swelling of both lower legs 09/08/2019   Dysuria 09/08/2019   Personal history of colon cancer    Polyp of descending colon    Gastroesophageal reflux disease without esophagitis 04/07/2019   Mild intermittent asthma without complication 04/07/2019   Pre-diabetes 01/16/2018   Hypomagnesemia 01/16/2018   Essential hypertension 08/05/2017   Impaired fasting glucose 08/05/2017   Mixed hyperlipidemia 08/05/2017   Vitamin D  deficiency 08/05/2017   Allergic rhinitis due to pollen 06/27/2017   Ventral hernia without obstruction or gangrene 05/29/2016   History of tachycardia 06/29/2014   Skin lesion of chest wall 03/20/2014   Disorder of skin or subcutaneous tissue 03/20/2014   History of colonic polyps 08/08/2013   Encounter for general adult medical examination with abnormal findings 08/08/2013   History of colonic polyps 08/08/2013   History of breast cancer 02/11/2013    Past Surgical History:  Procedure Laterality Date   APPENDECTOMY  2008   BREAST BIOPSY Left 2011   pt states had stereo done on mobile unit at Dr. Butch Cashing office. DCIS   BREAST LUMPECTOMY Left 02/2010   lumpectomy with re excision 03/2010 of left breast for cancer and rad tx   BREAST SURGERY Left 02/17/2010;03/10/2010   lumpectomy and repeat wide excision  CESAREAN SECTION  1998   COLON SURGERY Right 06/13/2006   Hand-assisted right hemicolectomy. 5.8 cm villous adenoma without atypia   COLONOSCOPY  2015   Normal exam.   COLONOSCOPY WITH PROPOFOL  N/A 05/29/2019   Procedure: COLONOSCOPY WITH PROPOFOL ;  Surgeon: Marnee Sink, MD;  Location: [facility];  Service: Endoscopy;  Laterality: N/A;   ESOPHAGOGASTRODUODENOSCOPY (EGD) WITH PROPOFOL  N/A 06/21/2015   Procedure: ESOPHAGOGASTRODUODENOSCOPY  (EGD) WITH PROPOFOL ;  Surgeon: Stephens Eis, MD;  Location: [facility] END[facility]COPY;  Service: Gastroenterology;  Laterality: N/A;    OB History     Gravida  2   Para      Term      Preterm      AB  1   Living  1      SAB  1   IAB      Ectopic      Multiple      Live Births           Obstetric Comments  Age with first menstruation-11 Age with first pregnancy-27 LMP-2010          Home Medications    Prior to Admission medications   Medication Sig Start Date End Date Taking? Authorizing Provider  albuterol (PROVENTIL HFA;VENTOLIN HFA) 108 (90 Base) MCG/ACT inhaler Inhale 2 puffs into the lungs every 6 (six) hours as needed for wheezing or shortness of breath.   Yes [provider]  amLODipine  (NORVASC ) 2.5 MG tablet Take 1 tablet (2.5 mg total) by mouth 2 (two) times daily. 04/03/23  Yes Walker, Caitlin S, NP  aspirin 81 MG tablet Take 81 mg by mouth daily.   Yes [provider]  carvedilol  (COREG ) 25 MG tablet Take 1 tablet (25 mg total) by mouth 2 (two) times daily with a meal. 01/31/23  Yes Karamalegos, Kayleen Party, DO  chlorthalidone  (HYGROTON ) 25 MG tablet Take 0.5 tablets (12.5 mg total) by mouth daily. 04/26/23  Yes Clearnce Curia, NP  Cholecalciferol (VITAMIN D3) 50 MCG (2000 UT) capsule Take 2 capsules (4,000 Units total) by mouth daily. 08/04/21  Yes Karamalegos, Kayleen Party, DO  EPINEPHrine 0.3 mg/0.3 mL IJ SOAJ injection See admin instructions.   Yes [provider]  fluticasone (FLONASE) 50 MCG/ACT nasal spray 1-2 sprays   Yes [provider]  fluticasone furoate-vilanterol (BREO ELLIPTA) 100-25 MCG/ACT AEPB Inhale 1 puff into the lungs daily.   Yes [provider]  fluticasone furoate-vilanterol (BREO ELLIPTA) 100-25 MCG/ACT AEPB Inhale 1 puff into the lungs daily.   Yes [provider]  halobetasol  (ULTRAVATE ) 0.05 % ointment Apply topically 2 (two) times daily. As needed for dermatitis 08/08/23  Yes  Karamalegos, Kayleen Party, DO  loratadine (CLARITIN) 10 MG tablet Take 10 mg by mouth daily.   Yes [provider]  Magnesium  Glycinate 100 MG CAPS Take 200 mg by mouth daily. 08/04/21  Yes Karamalegos, Kayleen Party, DO  montelukast  (SINGULAIR ) 10 MG tablet TAKE 1 TABLET(10 MG) BY MOUTH AT BEDTIME 07/07/23  Yes Karamalegos, Kayleen Party, DO  pantoprazole  (PROTONIX ) 40 MG tablet Take 1 tablet (40 mg total) by mouth daily before breakfast. 01/31/23  Yes Karamalegos, Kayleen Party, DO  potassium chloride  SA (KLOR-CON  M) 20 MEQ tablet Take 1 tablet (20 mEq total) by mouth daily. 04/03/23  Yes Clearnce Curia, NP  Spacer/Aero-Holding Chambers (AEROCHAMBER MV) inhaler See admin instructions.   Yes [provider]  budesonide-formoterol (SYMBICORT) 80-4.5 MCG/ACT inhaler Inhale 2 puffs into the lungs 2 (two) times daily. Patient not  taking: Reported on 08/08/2023    [provider]    Family History Family History  Problem Relation Age of Onset   Hypertension Father    Cancer Father        colon   Hypertension Brother    Cancer Other        unknown family member with breast cancer   Colon polyps Other        unknown family member with colon polyps   Breast cancer Neg Hx     Social History Social History   Tobacco Use   Smoking status: Never   Smokeless tobacco: Never  Vaping Use   Vaping status: Never Used  Substance Use Topics   Alcohol use: No   Drug use: No     Allergies   Levaquin [levofloxacin] and Penicillins   Review of Systems Review of Systems  Skin:  Positive for rash.     Physical Exam Triage Vital Signs ED Triage Vitals  Encounter Vitals Group     BP 08/18/23 1526 130/82     Girls Systolic BP Percentile --      Girls Diastolic BP Percentile --      Boys Systolic BP Percentile --      Boys Diastolic BP Percentile --      Pulse Rate 08/18/23 1526 73     Resp 08/18/23 1526 16     Temp 08/18/23 1526 98.8 F (37.1 C)     Temp Source  08/18/23 1526 Oral     SpO2 08/18/23 1526 95 %     Weight 08/18/23 1525 145 lb 2 oz (65.8 kg)     Height 08/18/23 1525 5' (1.524 m)     Head Circumference --      Peak Flow --      Pain Score 08/18/23 1531 0     Pain Loc --      Pain Education --      Exclude from Growth Chart --    No data found.  Updated Vital Signs BP 130/82 (BP Location: Right Arm)   Pulse 73   Temp 98.8 F (37.1 C) (Oral)   Resp 16   Ht 5' (1.524 m)   Wt 145 lb 2 oz (65.8 kg)   SpO2 95%   BMI 28.34 kg/m   Visual Acuity Right Eye Distance:   Left Eye Distance:   Bilateral Distance:    Right Eye Near:   Left Eye Near:    Bilateral Near:     Physical Exam Vitals and nursing note reviewed.  Constitutional:      General: She is not in acute distress.    Appearance: Normal appearance. She is not ill-appearing.  HENT:     Head: Normocephalic and atraumatic.   Eyes:     Conjunctiva/sclera: Conjunctivae normal.     Pupils: Pupils are equal, round, and reactive to light.     Comments: There is some mild erythema with dry flaking skin to the right upper eyelid.  There is no periorbital swelling.  No drainage or warmth.   Cardiovascular:     Rate and Rhythm: Normal rate.  Pulmonary:     Effort: Pulmonary effort is normal.   Skin:    General: Skin is warm and dry.   Neurological:     General: No focal deficit present.     Mental Status: She is alert and oriented to person, place, and time.   Psychiatric:        Mood  and Affect: Mood normal.        Behavior: Behavior normal.      UC Treatments / Results  Labs (all labs ordered are listed, but only abnormal results are displayed) Labs Reviewed - No data to display  EKG   Radiology No results found.  Procedures Procedures (including critical care time)  Medications Ordered in UC Medications - No data to display  Initial Impression / Assessment and Plan / UC Course  I have reviewed the triage vital signs and the nursing  notes.  Pertinent labs & imaging results that were available during my care of the patient were reviewed by me and considered in my medical decision making (see chart for details).     Discussed with patient eyelid dermatitis.  Continue Vaseline or over-the-counter Aquaphor.  Discussed starting allergy medicine such as Claritin or Zyrtec daily.  She declined oral steroids at this time.  Advised PCP or ophthalmology follow-up as symptoms do not improve.  ER precautions reviewed. Final Clinical Impressions(s) / UC Diagnoses   Final diagnoses:  Eyelid dermatitis, allergic/contact     Discharge Instructions      Continue topical Vaseline or he may use over-the-counter Aquaphor to hydrate the area.  Start your allergy medicine daily.  Follow-up with your PCP or ophthalmologist if symptoms do not improve.  Please go to the ER for any worsening symptoms.  Hope you feel better soon!    ED Prescriptions   None    PDMP not reviewed this encounter.   Alleen Arbour, NP 08/18/23 [phone]

## 2023-08-18 NOTE — Discharge Instructions (Addendum)
 Continue topical Vaseline or he may use over-the-counter Aquaphor to hydrate the area.  Start your allergy medicine daily.  Follow-up with your PCP or ophthalmologist if symptoms do not improve.  Please go to the ER for any worsening symptoms.  Hope you feel better soon!

## 2023-08-20 DIAGNOSIS — H1031 Unspecified acute conjunctivitis, right eye: Secondary | ICD-10-CM | POA: Diagnosis not present

## 2023-08-21 DIAGNOSIS — J301 Allergic rhinitis due to pollen: Secondary | ICD-10-CM | POA: Diagnosis not present

## 2023-08-21 DIAGNOSIS — J3081 Allergic rhinitis due to animal (cat) (dog) hair and dander: Secondary | ICD-10-CM | POA: Diagnosis not present

## 2023-08-21 DIAGNOSIS — J3089 Other allergic rhinitis: Secondary | ICD-10-CM | POA: Diagnosis not present

## 2023-09-04 DIAGNOSIS — J3089 Other allergic rhinitis: Secondary | ICD-10-CM | POA: Diagnosis not present

## 2023-09-04 DIAGNOSIS — J301 Allergic rhinitis due to pollen: Secondary | ICD-10-CM | POA: Diagnosis not present

## 2023-09-04 DIAGNOSIS — J3081 Allergic rhinitis due to animal (cat) (dog) hair and dander: Secondary | ICD-10-CM | POA: Diagnosis not present

## 2023-09-11 DIAGNOSIS — J301 Allergic rhinitis due to pollen: Secondary | ICD-10-CM | POA: Diagnosis not present

## 2023-09-11 DIAGNOSIS — J3089 Other allergic rhinitis: Secondary | ICD-10-CM | POA: Diagnosis not present

## 2023-09-12 DIAGNOSIS — J3081 Allergic rhinitis due to animal (cat) (dog) hair and dander: Secondary | ICD-10-CM | POA: Diagnosis not present

## 2023-09-18 DIAGNOSIS — J3089 Other allergic rhinitis: Secondary | ICD-10-CM | POA: Diagnosis not present

## 2023-09-18 DIAGNOSIS — J3081 Allergic rhinitis due to animal (cat) (dog) hair and dander: Secondary | ICD-10-CM | POA: Diagnosis not present

## 2023-09-18 DIAGNOSIS — J301 Allergic rhinitis due to pollen: Secondary | ICD-10-CM | POA: Diagnosis not present

## 2023-09-25 DIAGNOSIS — J3081 Allergic rhinitis due to animal (cat) (dog) hair and dander: Secondary | ICD-10-CM | POA: Diagnosis not present

## 2023-09-25 DIAGNOSIS — J301 Allergic rhinitis due to pollen: Secondary | ICD-10-CM | POA: Diagnosis not present

## 2023-09-25 DIAGNOSIS — J3089 Other allergic rhinitis: Secondary | ICD-10-CM | POA: Diagnosis not present

## 2023-10-02 DIAGNOSIS — J3089 Other allergic rhinitis: Secondary | ICD-10-CM | POA: Diagnosis not present

## 2023-10-02 DIAGNOSIS — J301 Allergic rhinitis due to pollen: Secondary | ICD-10-CM | POA: Diagnosis not present

## 2023-10-02 DIAGNOSIS — J3081 Allergic rhinitis due to animal (cat) (dog) hair and dander: Secondary | ICD-10-CM | POA: Diagnosis not present

## 2023-10-04 ENCOUNTER — Other Ambulatory Visit: Payer: Self-pay | Admitting: Family Medicine

## 2023-10-04 NOTE — Telephone Encounter (Signed)
 Requested Prescriptions  Pending Prescriptions Disp Refills   montelukast  (SINGULAIR ) 10 MG tablet [Pharmacy Med Name: MONTELUKAST  10MG  TABLETS] 90 tablet 0    Sig: TAKE 1 TABLET(10 MG) BY MOUTH AT BEDTIME     Pulmonology:  Leukotriene Inhibitors Passed - 10/04/2023  4:28 PM      Passed - Valid encounter within last 12 months    Recent Outpatient Visits           1 month ago Pre-diabetes   [facility] [place] [place], [place]a [place], [place]

## 2023-10-16 DIAGNOSIS — J3081 Allergic rhinitis due to animal (cat) (dog) hair and dander: Secondary | ICD-10-CM | POA: Diagnosis not present

## 2023-10-16 DIAGNOSIS — J301 Allergic rhinitis due to pollen: Secondary | ICD-10-CM | POA: Diagnosis not present

## 2023-10-16 DIAGNOSIS — J3089 Other allergic rhinitis: Secondary | ICD-10-CM | POA: Diagnosis not present

## 2023-10-19 DIAGNOSIS — H1031 Unspecified acute conjunctivitis, right eye: Secondary | ICD-10-CM | POA: Diagnosis not present

## 2023-10-23 DIAGNOSIS — J3089 Other allergic rhinitis: Secondary | ICD-10-CM | POA: Diagnosis not present

## 2023-10-23 DIAGNOSIS — J301 Allergic rhinitis due to pollen: Secondary | ICD-10-CM | POA: Diagnosis not present

## 2023-10-23 DIAGNOSIS — J3081 Allergic rhinitis due to animal (cat) (dog) hair and dander: Secondary | ICD-10-CM | POA: Diagnosis not present

## 2023-10-30 DIAGNOSIS — J3081 Allergic rhinitis due to animal (cat) (dog) hair and dander: Secondary | ICD-10-CM | POA: Diagnosis not present

## 2023-10-30 DIAGNOSIS — J301 Allergic rhinitis due to pollen: Secondary | ICD-10-CM | POA: Diagnosis not present

## 2023-10-30 DIAGNOSIS — J3089 Other allergic rhinitis: Secondary | ICD-10-CM | POA: Diagnosis not present

## 2023-11-13 DIAGNOSIS — J3081 Allergic rhinitis due to animal (cat) (dog) hair and dander: Secondary | ICD-10-CM | POA: Diagnosis not present

## 2023-11-13 DIAGNOSIS — J301 Allergic rhinitis due to pollen: Secondary | ICD-10-CM | POA: Diagnosis not present

## 2023-11-13 DIAGNOSIS — J3089 Other allergic rhinitis: Secondary | ICD-10-CM | POA: Diagnosis not present

## 2023-11-20 DIAGNOSIS — J3089 Other allergic rhinitis: Secondary | ICD-10-CM | POA: Diagnosis not present

## 2023-11-20 DIAGNOSIS — J301 Allergic rhinitis due to pollen: Secondary | ICD-10-CM | POA: Diagnosis not present

## 2023-11-20 DIAGNOSIS — J3081 Allergic rhinitis due to animal (cat) (dog) hair and dander: Secondary | ICD-10-CM | POA: Diagnosis not present

## 2023-11-23 ENCOUNTER — Other Ambulatory Visit: Payer: Self-pay | Admitting: Family Medicine

## 2023-11-23 DIAGNOSIS — Z1231 Encounter for screening mammogram for malignant neoplasm of breast: Secondary | ICD-10-CM

## 2023-11-27 DIAGNOSIS — J3089 Other allergic rhinitis: Secondary | ICD-10-CM | POA: Diagnosis not present

## 2023-11-27 DIAGNOSIS — J301 Allergic rhinitis due to pollen: Secondary | ICD-10-CM | POA: Diagnosis not present

## 2023-11-27 DIAGNOSIS — J3081 Allergic rhinitis due to animal (cat) (dog) hair and dander: Secondary | ICD-10-CM | POA: Diagnosis not present

## 2023-12-04 DIAGNOSIS — J3089 Other allergic rhinitis: Secondary | ICD-10-CM | POA: Diagnosis not present

## 2023-12-04 DIAGNOSIS — J3081 Allergic rhinitis due to animal (cat) (dog) hair and dander: Secondary | ICD-10-CM | POA: Diagnosis not present

## 2023-12-04 DIAGNOSIS — J301 Allergic rhinitis due to pollen: Secondary | ICD-10-CM | POA: Diagnosis not present

## 2023-12-20 ENCOUNTER — Ambulatory Visit: Payer: PPO

## 2023-12-20 VITALS — Ht 60.0 in | Wt 145.0 lb

## 2023-12-20 DIAGNOSIS — Z0000 Encounter for general adult medical examination without abnormal findings: Secondary | ICD-10-CM | POA: Diagnosis not present

## 2023-12-20 DIAGNOSIS — J3089 Other allergic rhinitis: Secondary | ICD-10-CM | POA: Diagnosis not present

## 2023-12-20 DIAGNOSIS — J301 Allergic rhinitis due to pollen: Secondary | ICD-10-CM | POA: Diagnosis not present

## 2023-12-20 DIAGNOSIS — J3081 Allergic rhinitis due to animal (cat) (dog) hair and dander: Secondary | ICD-10-CM | POA: Diagnosis not present

## 2023-12-20 NOTE — Patient Instructions (Addendum)
 Stacie Ellis,  Thank you for taking the time for your Medicare Wellness Visit. I appreciate your continued commitment to your health goals. Please review the care plan we discussed, and feel free to reach out if I can assist you further.  Medicare recommends these wellness visits once per year to help you and your care team stay ahead of potential health issues. These visits are designed to focus on prevention, allowing your provider to concentrate on managing your acute and chronic conditions during your regular appointments.  Please note that Annual Wellness Visits do not include a physical exam. Some assessments may be limited, especially if the visit was conducted virtually. If needed, we may recommend a separate in-person follow-up with your provider.  Ongoing Care Seeing your primary care provider every 3 to 6 months helps us  monitor your health and provide consistent, personalized care.   Referrals If a referral was made during today's visit and you haven't received any updates within two weeks, please contact the referred provider directly to check on the status.  Recommended Screenings:  Health Maintenance  Topic Date Due   COVID-19 Vaccine (1) Never done   DTaP/Tdap/Td vaccine (1 - Tdap) Never done   Zoster (Shingles) Vaccine (1 of 2) Never done   Breast Cancer Screening  09/05/2023   Flu Shot  10/05/2023   Pneumococcal Vaccine for age over 66 (2 of 2 - PCV) 04/10/2024   Colon Cancer Screening  05/28/2024   Medicare Annual Wellness Visit  12/19/2024   DEXA scan (bone density measurement)  Completed   Hepatitis C Screening  Completed   Meningitis B Vaccine  Aged Out       12/20/2023    2:48 PM  Advanced Directives  Does Patient Have a Medical Advance Directive? No  Would patient like information on creating a medical advance directive? No - Patient declined   Advance Care Planning is important because it: Ensures you receive medical care that aligns with your values,  goals, and preferences. Provides guidance to your family and loved ones, reducing the emotional burden of decision-making during critical moments.  Vision: Annual vision screenings are recommended for early detection of glaucoma, cataracts, and diabetic retinopathy. These exams can also reveal signs of chronic conditions such as diabetes and high blood pressure.  Dental: Annual dental screenings help detect early signs of oral cancer, gum disease, and other conditions linked to overall health, including heart disease and diabetes.  Please see the attached documents for additional preventive care recommendations.

## 2023-12-20 NOTE — Progress Notes (Signed)
 Subjective:   Stacie Ellis is a 68 y.o. who presents for a Medicare Wellness preventive visit.  As a reminder, Annual Wellness Visits don't include a physical exam, and some assessments may be limited, especially if this visit is performed virtually. We may recommend an in-person follow-up visit with your provider if needed.  Visit Complete: Virtual I connected with  Stacie Ellis on 12/20/23 by a audio enabled telemedicine application and verified that I am speaking with the correct person using two identifiers.  Patient Location: Home  Provider Location: Home Office  I discussed the limitations of evaluation and management by telemedicine. The patient expressed understanding and agreed to proceed.  Vital Signs: Because this visit was a virtual/telehealth visit, some criteria may be missing or patient reported. Any vitals not documented were not able to be obtained and vitals that have been documented are patient reported.    Persons Participating in Visit: Patient.  AWV Questionnaire: No: Patient Medicare AWV questionnaire was not completed prior to this visit.  Cardiac Risk Factors include: advanced age (>11men, >84 women);hypertension     Objective:    Today's Vitals   12/20/23 1443  Weight: 145 lb (65.8 kg)  Height: 5' (1.524 m)   Body mass index is 28.32 kg/m.     12/20/2023    2:48 PM 12/15/2022   10:48 AM 12/09/2021   11:43 AM 05/29/2019   11:52 AM 08/09/2015    2:33 PM 06/21/2015    2:08 PM 08/05/2014    3:14 PM  Advanced Directives  Does Patient Have a Medical Advance Directive? No No No No No  No  No   Would patient like information on creating a medical advance directive? No - Patient declined No - Patient declined No - Patient declined No - Patient declined Yes - Educational materials given   No - patient declined information      Data saved with a previous flowsheet row definition    Current Medications (verified) Outpatient Encounter  Medications as of 12/20/2023  Medication Sig   albuterol (PROVENTIL HFA;VENTOLIN HFA) 108 (90 Base) MCG/ACT inhaler Inhale 2 puffs into the lungs every 6 (six) hours as needed for wheezing or shortness of breath.   amLODipine  (NORVASC ) 2.5 MG tablet Take 1 tablet (2.5 mg total) by mouth 2 (two) times daily.   aspirin 81 MG tablet Take 81 mg by mouth daily.   budesonide-formoterol (SYMBICORT) 80-4.5 MCG/ACT inhaler Inhale 2 puffs into the lungs 2 (two) times daily. (Patient not taking: Reported on 08/08/2023)   carvedilol  (COREG ) 25 MG tablet Take 1 tablet (25 mg total) by mouth 2 (two) times daily with a meal.   chlorthalidone  (HYGROTON ) 25 MG tablet Take 0.5 tablets (12.5 mg total) by mouth daily.   Cholecalciferol (VITAMIN D3) 50 MCG (2000 UT) capsule Take 2 capsules (4,000 Units total) by mouth daily.   EPINEPHrine 0.3 mg/0.3 mL IJ SOAJ injection See admin instructions.   fluticasone (FLONASE) 50 MCG/ACT nasal spray 1-2 sprays   fluticasone furoate-vilanterol (BREO ELLIPTA) 100-25 MCG/ACT AEPB Inhale 1 puff into the lungs daily.   fluticasone furoate-vilanterol (BREO ELLIPTA) 100-25 MCG/ACT AEPB Inhale 1 puff into the lungs daily.   halobetasol  (ULTRAVATE ) 0.05 % ointment Apply topically 2 (two) times daily. As needed for dermatitis   loratadine (CLARITIN) 10 MG tablet Take 10 mg by mouth daily.   Magnesium  Glycinate 100 MG CAPS Take 200 mg by mouth daily.   montelukast  (SINGULAIR ) 10 MG tablet TAKE 1 TABLET(10 MG) BY  MOUTH AT BEDTIME   pantoprazole  (PROTONIX ) 40 MG tablet Take 1 tablet (40 mg total) by mouth daily before breakfast.   potassium chloride  SA (KLOR-CON  M) 20 MEQ tablet Take 1 tablet (20 mEq total) by mouth daily.   Spacer/Aero-Holding Chambers (AEROCHAMBER MV) inhaler See admin instructions.   No facility-administered encounter medications on file as of 12/20/2023.    Allergies (verified) Levaquin [levofloxacin] and Penicillins   History: Past Medical History:  Diagnosis  Date   Asthma    Breast cancer (HCC) 02/2010   GERD (gastroesophageal reflux disease) 2008   Hypertension    Malignant neoplasm of upper-outer quadrant of female breast (HCC) 2011   Intermediate grade DCIS, ER: 50%; PR 10%. left breast lumpectomy, wide excision and a repeat wide excision on 03/10/2010 for multiple positive margins on original resection   Malignant neoplasm of upper-outer quadrant of female breast [facility]) January 2012:    Re-excision to negative margins.    Obesity, unspecified    Postmenopausal bleeding 2013   S/P radiation therapy 2012   whole breast,    Special screening for malignant neoplasms, colon    Past Surgical History:  Procedure Laterality Date   APPENDECTOMY  2008   BREAST BIOPSY Left 2011   pt states had stereo done on mobile unit at Dr. Fredirick office. DCIS   BREAST LUMPECTOMY Left 02/2010   lumpectomy with re excision 03/2010 of left breast for cancer and rad tx   BREAST SURGERY Left 02/17/2010;03/10/2010   lumpectomy and repeat wide excision   CESAREAN SECTION  1998   COLON SURGERY Right 06/13/2006   Hand-assisted right hemicolectomy. 5.8 cm villous adenoma without atypia   COLONOSCOPY  2015   Normal exam.   COLONOSCOPY WITH PROPOFOL  N/A 05/29/2019   Procedure: COLONOSCOPY WITH PROPOFOL ;  Surgeon: Jinny Carmine, MD;  Location: [facility] ENDOSCOPY;  Service: Endoscopy;  Laterality: N/A;   ESOPHAGOGASTRODUODENOSCOPY (EGD) WITH PROPOFOL  N/A 06/21/2015   Procedure: ESOPHAGOGASTRODUODENOSCOPY (EGD) WITH PROPOFOL ;  Surgeon: Deward CINDERELLA Piedmont, MD;  Location: [facility] ENDOSCOPY;  Service: Gastroenterology;  Laterality: N/A;   Family History  Problem Relation Age of Onset   Hypertension Father    Cancer Father        colon   Hypertension Brother    Cancer Other        unknown family member with breast cancer   Colon polyps Other        unknown family member with colon polyps   Breast cancer Neg Hx    Social History   Socioeconomic History   Marital status: Married     Spouse name: Not on file   Number of children: Not on file   Years of education: Not on file   Highest education level: Not on file  Occupational History   Not on file  Tobacco Use   Smoking status: Never   Smokeless tobacco: Never  Vaping Use   Vaping status: Never Used  Substance and Sexual Activity   Alcohol use: No   Drug use: No   Sexual activity: Yes    Birth control/protection: Post-menopausal  Other Topics Concern   Not on file  Social History Narrative   Not on file   Social Drivers of Health   Financial Resource Strain: Low Risk  (12/20/2023)   Overall Financial Resource Strain (CARDIA)    Difficulty of Paying Living Expenses: Not hard at all  Food Insecurity: No Food Insecurity (12/20/2023)   Hunger Vital Sign    Worried About Running Out of  Food in the Last Year: Never true    Ran Out of Food in the Last Year: Never true  Transportation Needs: No Transportation Needs (12/20/2023)   PRAPARE - Administrator, Civil Service (Medical): No    Lack of Transportation (Non-Medical): No  Physical Activity: Inactive (12/20/2023)   Exercise Vital Sign    Days of Exercise per Week: 0 days    Minutes of Exercise per Session: 0 min  Stress: No Stress Concern Present (12/20/2023)   [employer] of Occupational Health - Occupational Stress Questionnaire    Feeling of Stress: Not at all  Social Connections: Socially Integrated (12/20/2023)   Social Connection and Isolation Panel    Frequency of Communication with Friends and Family: More than three times a week    Frequency of Social Gatherings with Friends and Family: More than three times a week    Attends Religious Services: More than 4 times per year    Active Member of [employer] or Organizations: Yes    Attends Engineer, structural: More than 4 times per year    Marital Status: Married    Tobacco Counseling Counseling given: Not Answered    Clinical Intake:  Pre-visit preparation  completed: Yes  Pain : No/denies pain     BMI - recorded: 28.32 Nutritional Status: BMI 25 -29 Overweight Nutritional Risks: None Diabetes: No  Lab Results  Component Value Date   HGBA1C 6.1 (H) 07/31/2023   HGBA1C 6.0 (H) 01/24/2023   HGBA1C 6.0 (H) 07/12/2022     How often do you need to have someone help you when you read instructions, pamphlets, or other written materials from your doctor or pharmacy?: 1 - Never  Interpreter Needed?: No  Information entered by :: Rojelio Blush LPN   Activities of Daily Living     12/20/2023    2:47 PM  In your present state of health, do you have any difficulty performing the following activities:  Hearing? 0  Vision? 0  Difficulty concentrating or making decisions? 0  Walking or climbing stairs? 0  Dressing or bathing? 0  Doing errands, shopping? 0  Preparing Food and eating ? N  Using the Toilet? N  In the past six months, have you accidently leaked urine? N  Do you have problems with loss of bowel control? N  Managing your Medications? N  Managing your Finances? N  Housekeeping or managing your Housekeeping? N    Patient Care Team: Edman Marsa PARAS, DO as PCP - General (Family Medicine) Dessa Reyes ORN, MD (General Surgery) Fleeta Milks, Lamar ORN, MD (Unknown Physician Specialty) Ermalinda Lenn HERO, LCSW as Social Worker  I have updated your Care Teams any recent Medical Services you may have received from other providers in the past year.     Assessment:   This is a routine wellness examination for Stacie Ellis.  Hearing/Vision screen Hearing Screening - Comments:: Denies hearing difficulties   Vision Screening - Comments:: Wears rx glasses - up to date with routine eye exams with  Dr Mevelyn   Goals Addressed               This Visit's Progress     Increase physical activity (pt-stated)        Get more active       Depression Screen     12/20/2023    2:46 PM 08/08/2023    2:31 PM 01/31/2023     9:34 AM 12/15/2022   10:47 AM 12/09/2021  11:42 AM 08/04/2021   10:53 AM 02/07/2021    1:58 PM  PHQ 2/9 Scores  PHQ - 2 Score 0 0 0 0 0 0 0  PHQ- 9 Score  0 0 0 0      Fall Risk     12/20/2023    2:47 PM 08/08/2023    2:30 PM 01/31/2023    9:34 AM 12/15/2022   10:49 AM 12/09/2021   11:43 AM  Fall Risk   Falls in the past year? 0 1 0 0 0  Number falls in past yr: 0 0  0 0  Injury with Fall? 0 0  0 0  Risk for fall due to : No Fall Risks   No Fall Risks No Fall Risks  Follow up Falls evaluation completed   Falls prevention discussed;Falls evaluation completed Falls prevention discussed;Falls evaluation completed      Data saved with a previous flowsheet row definition    MEDICARE RISK AT HOME:  Medicare Risk at Home Any stairs in or around the home?: Yes If so, are there any without handrails?: No Home free of loose throw rugs in walkways, pet beds, electrical cords, etc?: Yes Adequate lighting in your home to reduce risk of falls?: Yes Life alert?: No Use of a cane, walker or w/c?: No Grab bars in the bathroom?: No Shower chair or bench in shower?: No Elevated toilet seat or a handicapped toilet?: Yes  TIMED UP AND GO:  Was the test performed?  No  Cognitive Function: 6CIT completed    12/08/2020   10:22 AM 12/08/2019   11:09 AM  MMSE - Mini Mental State Exam  Orientation to time 5 5  Orientation to Place 5 5  Registration 3 3  Attention/ Calculation 5 5  Recall 3 3  Language- name 2 objects 2 2  Language- repeat 1 1  Language- follow 3 step command 3 3  Language- read & follow direction 1 1  Write a sentence 1 1  Copy design 1 1  Total score 30 30        12/20/2023    2:48 PM 12/15/2022   10:50 AM 12/09/2021   11:44 AM  6CIT Screen  What Year? 0 points 0 points 0 points  What month? 0 points 0 points 0 points  What time? 0 points 0 points 0 points  Count back from 20 0 points 0 points 0 points  Months in reverse 0 points 0 points 0 points  Repeat  phrase 0 points 0 points 0 points  Total Score 0 points 0 points 0 points    Immunizations Immunization History  Administered Date(s) Administered   Influenza,inj,Quad PF,6+ Mos 04/11/2023   Pneumococcal Polysaccharide-23 04/11/2023    Screening Tests Health Maintenance  Topic Date Due   COVID-19 Vaccine (1) Never done   DTaP/Tdap/Td (1 - Tdap) Never done   Zoster Vaccines- Shingrix (1 of 2) Never done   Mammogram  09/05/2023   Influenza Vaccine  10/05/2023   Pneumococcal Vaccine: 50+ Years (2 of 2 - PCV) 04/10/2024   Colonoscopy  05/28/2024   Medicare Annual Wellness (AWV)  12/19/2024   DEXA SCAN  Completed   Hepatitis C Screening  Completed   Meningococcal B Vaccine  Aged Out    Health Maintenance Items Addressed:   Additional Screening:  Vision Screening: Recommended annual ophthalmology exams for early detection of glaucoma and other disorders of the eye. Is the patient up to date with their annual eye exam?  Yes  Who is the provider or what is the name of the office in which the patient attends annual eye exams? Dr Mevelyn  Dental Screening: Recommended annual dental exams for proper oral hygiene  Community Resource Referral / Chronic Care Management: CRR required this visit?  Yes   CCM required this visit?  No   Plan:    I have personally reviewed and noted the following in the patient's chart:   Medical and social history Use of alcohol, tobacco or illicit drugs  Current medications and supplements including opioid prescriptions. Patient is not currently taking opioid prescriptions. Functional ability and status Nutritional status Physical activity Advanced directives List of other physicians Hospitalizations, surgeries, and ER visits in previous 12 months Vitals Screenings to include cognitive, depression, and falls Referrals and appointments  In addition, I have reviewed and discussed with patient certain preventive protocols, quality metrics,  and best practice recommendations. A written personalized care plan for preventive services as well as general preventive health recommendations were provided to patient.   Rojelio LELON Blush, LPN   [id number]   After Visit Summary: (MyChart) Due to this being a telephonic visit, the after visit summary with patients personalized plan was offered to patient via MyChart   Notes: Nothing significant to report at this time.

## 2023-12-25 ENCOUNTER — Ambulatory Visit
Admission: RE | Admit: 2023-12-25 | Discharge: 2023-12-25 | Disposition: A | Discharge status: Home / Self Care | Source: Ambulatory Visit | Attending: Family Medicine | Admitting: Family Medicine

## 2023-12-25 DIAGNOSIS — J3081 Allergic rhinitis due to animal (cat) (dog) hair and dander: Secondary | ICD-10-CM | POA: Diagnosis not present

## 2023-12-25 DIAGNOSIS — J3089 Other allergic rhinitis: Secondary | ICD-10-CM | POA: Diagnosis not present

## 2023-12-25 DIAGNOSIS — J301 Allergic rhinitis due to pollen: Secondary | ICD-10-CM | POA: Diagnosis not present

## 2023-12-25 DIAGNOSIS — Z1231 Encounter for screening mammogram for malignant neoplasm of breast: Secondary | ICD-10-CM | POA: Insufficient documentation

## 2024-01-01 DIAGNOSIS — J3081 Allergic rhinitis due to animal (cat) (dog) hair and dander: Secondary | ICD-10-CM | POA: Diagnosis not present

## 2024-01-01 DIAGNOSIS — J301 Allergic rhinitis due to pollen: Secondary | ICD-10-CM | POA: Diagnosis not present

## 2024-01-01 DIAGNOSIS — J3089 Other allergic rhinitis: Secondary | ICD-10-CM | POA: Diagnosis not present

## 2024-01-02 DIAGNOSIS — J3081 Allergic rhinitis due to animal (cat) (dog) hair and dander: Secondary | ICD-10-CM | POA: Diagnosis not present

## 2024-01-02 DIAGNOSIS — J3089 Other allergic rhinitis: Secondary | ICD-10-CM | POA: Diagnosis not present

## 2024-01-03 ENCOUNTER — Other Ambulatory Visit: Payer: Self-pay | Admitting: Family Medicine

## 2024-01-04 NOTE — Telephone Encounter (Signed)
 Requested Prescriptions  Pending Prescriptions Disp Refills   montelukast  (SINGULAIR ) 10 MG tablet [Pharmacy Med Name: MONTELUKAST  10MG  TABLETS] 90 tablet 1    Sig: TAKE 1 TABLET(10 MG) BY MOUTH AT BEDTIME     Pulmonology:  Leukotriene Inhibitors Passed - 01/04/2024  3:29 PM      Passed - Valid encounter within last 12 months    Recent Outpatient Visits           4 months ago Pre-diabetes   [place] [facility] [place], [place]a PARAS, [place]

## 2024-01-08 DIAGNOSIS — J3081 Allergic rhinitis due to animal (cat) (dog) hair and dander: Secondary | ICD-10-CM | POA: Diagnosis not present

## 2024-01-08 DIAGNOSIS — J3089 Other allergic rhinitis: Secondary | ICD-10-CM | POA: Diagnosis not present

## 2024-01-08 DIAGNOSIS — J301 Allergic rhinitis due to pollen: Secondary | ICD-10-CM | POA: Diagnosis not present

## 2024-01-15 DIAGNOSIS — J3089 Other allergic rhinitis: Secondary | ICD-10-CM | POA: Diagnosis not present

## 2024-01-15 DIAGNOSIS — J3081 Allergic rhinitis due to animal (cat) (dog) hair and dander: Secondary | ICD-10-CM | POA: Diagnosis not present

## 2024-01-15 DIAGNOSIS — J301 Allergic rhinitis due to pollen: Secondary | ICD-10-CM | POA: Diagnosis not present

## 2024-01-22 DIAGNOSIS — J3081 Allergic rhinitis due to animal (cat) (dog) hair and dander: Secondary | ICD-10-CM | POA: Diagnosis not present

## 2024-01-22 DIAGNOSIS — J301 Allergic rhinitis due to pollen: Secondary | ICD-10-CM | POA: Diagnosis not present

## 2024-01-22 DIAGNOSIS — J3089 Other allergic rhinitis: Secondary | ICD-10-CM | POA: Diagnosis not present

## 2024-02-05 DIAGNOSIS — J301 Allergic rhinitis due to pollen: Secondary | ICD-10-CM | POA: Diagnosis not present

## 2024-02-05 DIAGNOSIS — J3081 Allergic rhinitis due to animal (cat) (dog) hair and dander: Secondary | ICD-10-CM | POA: Diagnosis not present

## 2024-02-05 DIAGNOSIS — J3089 Other allergic rhinitis: Secondary | ICD-10-CM | POA: Diagnosis not present

## 2024-02-12 ENCOUNTER — Other Ambulatory Visit

## 2024-02-12 DIAGNOSIS — I25118 Atherosclerotic heart disease of native coronary artery with other forms of angina pectoris: Secondary | ICD-10-CM | POA: Diagnosis not present

## 2024-02-12 DIAGNOSIS — J3089 Other allergic rhinitis: Secondary | ICD-10-CM | POA: Diagnosis not present

## 2024-02-12 DIAGNOSIS — Z0000 Encounter for general adult medical examination without abnormal findings: Secondary | ICD-10-CM

## 2024-02-12 DIAGNOSIS — J3081 Allergic rhinitis due to animal (cat) (dog) hair and dander: Secondary | ICD-10-CM | POA: Diagnosis not present

## 2024-02-12 DIAGNOSIS — E538 Deficiency of other specified B group vitamins: Secondary | ICD-10-CM | POA: Diagnosis not present

## 2024-02-12 DIAGNOSIS — E782 Mixed hyperlipidemia: Secondary | ICD-10-CM | POA: Diagnosis not present

## 2024-02-12 DIAGNOSIS — R7303 Prediabetes: Secondary | ICD-10-CM

## 2024-02-12 DIAGNOSIS — J301 Allergic rhinitis due to pollen: Secondary | ICD-10-CM | POA: Diagnosis not present

## 2024-02-12 DIAGNOSIS — I10 Essential (primary) hypertension: Secondary | ICD-10-CM | POA: Diagnosis not present

## 2024-02-13 LAB — COMPREHENSIVE METABOLIC PANEL WITH GFR
AG Ratio: 1.5 (calc) (ref 1.0–2.5)
ALT: 13 U/L (ref 6–29)
AST: 16 U/L (ref 10–35)
Albumin: 4 g/dL (ref 3.6–5.1)
Alkaline phosphatase (APISO): 61 U/L (ref 37–153)
BUN: 14 mg/dL (ref 7–25)
CO2: 31 mmol/L (ref 20–32)
Calcium: 9.4 mg/dL (ref 8.6–10.4)
Chloride: 103 mmol/L (ref 98–110)
Creat: 0.63 mg/dL (ref 0.60–1.00)
Globulin: 2.7 g/dL (ref 1.9–3.7)
Glucose, Bld: 91 mg/dL (ref 65–99)
Potassium: 4.1 mmol/L (ref 3.5–5.3)
Sodium: 141 mmol/L (ref 135–146)
Total Bilirubin: 0.6 mg/dL (ref 0.2–1.2)
Total Protein: 6.7 g/dL (ref 6.1–8.1)
eGFR: 95 mL/min/1.73m2 (ref >=60)

## 2024-02-13 LAB — LIPID PANEL
Cholesterol: 209 mg/dL — ABNORMAL HIGH (ref <200)
HDL: 64 mg/dL (ref >=50)
LDL Cholesterol (Calc): 126 mg/dL — ABNORMAL HIGH
Non-HDL Cholesterol (Calc): 145 mg/dL — ABNORMAL HIGH (ref <130)
Total CHOL/HDL Ratio: 3.3 (calc) (ref <5.0)
Triglycerides: 91 mg/dL (ref <150)

## 2024-02-13 LAB — VITAMIN B12: Vitamin B-12: 871 pg/mL (ref 200–1100)

## 2024-02-13 LAB — HEMOGLOBIN A1C
Hgb A1c MFr Bld: 5.7 % — ABNORMAL HIGH (ref <5.7)
Mean Plasma Glucose: 117 mg/dL
eAG (mmol/L): 6.5 mmol/L

## 2024-02-13 LAB — CBC WITH DIFFERENTIAL/PLATELET
Absolute Lymphocytes: 2390 {cells}/uL (ref 850–3900)
Absolute Monocytes: 495 {cells}/uL (ref 200–950)
Basophils Absolute: 50 {cells}/uL (ref 0–200)
Basophils Relative: 1 %
Eosinophils Absolute: 335 {cells}/uL (ref 15–500)
Eosinophils Relative: 6.7 %
HCT: 43.6 % (ref 35.9–46.0)
Hemoglobin: 14.1 g/dL (ref 11.7–15.5)
MCH: 28.3 pg (ref 27.0–33.0)
MCHC: 32.3 g/dL (ref 31.6–35.4)
MCV: 87.6 fL (ref 81.4–101.7)
MPV: 11.5 fL (ref 7.5–12.5)
Monocytes Relative: 9.9 %
Neutro Abs: 1730 {cells}/uL (ref 1500–7800)
Neutrophils Relative %: 34.6 %
Platelets: 226 Thousand/uL (ref 140–400)
RBC: 4.98 Million/uL (ref 3.80–5.10)
RDW: 13 % (ref 11.0–15.0)
Total Lymphocyte: 47.8 %
WBC: 5 Thousand/uL (ref 3.8–10.8)

## 2024-02-13 LAB — TSH: TSH: 1.87 m[IU]/L (ref 0.40–4.50)

## 2024-02-14 ENCOUNTER — Encounter (HOSPITAL_BASED_OUTPATIENT_CLINIC_OR_DEPARTMENT_OTHER): Payer: Self-pay | Admitting: Family

## 2024-02-19 ENCOUNTER — Encounter: Payer: Self-pay | Admitting: Family Medicine

## 2024-02-19 ENCOUNTER — Ambulatory Visit: Admitting: Family Medicine

## 2024-02-19 VITALS — BP 130/78 | HR 72 | Ht 60.0 in | Wt 149.2 lb

## 2024-02-19 DIAGNOSIS — K219 Gastro-esophageal reflux disease without esophagitis: Secondary | ICD-10-CM

## 2024-02-19 DIAGNOSIS — F418 Other specified anxiety disorders: Secondary | ICD-10-CM

## 2024-02-19 DIAGNOSIS — K439 Ventral hernia without obstruction or gangrene: Secondary | ICD-10-CM

## 2024-02-19 DIAGNOSIS — E782 Mixed hyperlipidemia: Secondary | ICD-10-CM

## 2024-02-19 DIAGNOSIS — G720 Drug-induced myopathy: Secondary | ICD-10-CM | POA: Insufficient documentation

## 2024-02-19 DIAGNOSIS — I10 Essential (primary) hypertension: Secondary | ICD-10-CM

## 2024-02-19 DIAGNOSIS — Z0000 Encounter for general adult medical examination without abnormal findings: Secondary | ICD-10-CM

## 2024-02-19 DIAGNOSIS — Z9012 Acquired absence of left breast and nipple: Secondary | ICD-10-CM

## 2024-02-19 DIAGNOSIS — I2510 Atherosclerotic heart disease of native coronary artery without angina pectoris: Secondary | ICD-10-CM

## 2024-02-19 DIAGNOSIS — R7303 Prediabetes: Secondary | ICD-10-CM

## 2024-02-19 NOTE — Progress Notes (Signed)
 Subjective:    Patient ID: Stacie Ellis, female    DOB: Oct 05, 1955, 68 y.o.   MRN: [id number]  Stacie Ellis is a 68 y.o. female presenting on 02/19/2024 for Annual Exam   HPI  Discussed the use of AI scribe software for clinical note transcription with the patient, who gave verbal consent to proceed.  History of Present Illness   Stacie Ellis is a 68 year old female who presents for an annual physical exam.  - Weight loss of approximately 10 pounds over the past year (peak 159 lbs, current 145 lbs) - Weight loss attributed to dietary changes, including participation in a 'Daniel's fast' at the beginning of the year (elimination of dairy, gluten, and processed foods) - Maintains a healthy diet until mid-summer, with difficulty sustaining dietary changes through the holidays  Hyperlipidemia and statin intolerance / drug myopathy Coronary Artery Disease (CAD) Followed by Cardiology - History of elevated cholesterol levels - Current LDL 126 mg/dL, stable compared to previous readings. Goal < 55 LDL - Muscle aches with prior statin therapy - Currently taking red yeast rice as a natural alternative, with good tolerance - Prior Coronary Calcium  Score mild to moderate range, 2024.  - Past history Additional labs with CRP 5.3, Lipoprotein (a) < 10 reassurance with low risk. - Currently NOT on statin. On supplements CoQ10, Fish Oil, Red Yeast Rice, Niacin  Prediabetes and glycemic control - A1c currently 5.7%, improved from previous levels of 6.0-6.1% - Low end of prediabetes range - Strong family history of diabetes - Actively managing blood glucose through lifestyle modifications  Ventral / Surgical Hernia Abdominal hernia symptoms History of partial colectomy due to pre-cancerous polyps. Done 2008 - Surgical hernia with increased discomfort recently - New symptoms include gurgling noises and occasional bulging  Situational anxiety and cardiovascular  response - Experiences situational anxiety, especially when speaking at events - Associated with significant increases in blood pressure and heart rate - Previously managed with metoprolol; currently on carvedilol , which is less effective for acute episodes  Immunization status - No influenza vaccination received this season - Uncertain about prior pneumococcal vaccination, though records indicate Pneumovax 23 administered in February 2025       PMH GERD / Hoarseness of Voice On PPI Pantoprazole  40mg  daily, has some breakthrough symptoms.    CHRONIC HTN: Followed by Cardiology [facility] Has done well with med adjustments overall Prior work up and eval done. She has been on BP medication since age 17s She has significant fam history of father and older brother with HTN diagnosed in their 79s. Her BP cuff similar to our readings  Current Meds - Amlodipine  2.5mg  BID - Carvedilol  25mg  TWICE A DAY - Chlorthalidone  12.5mg  (half of 25mg ) daily OFF Lisinopril , HCTZ, Telmisartan    Reports good compliance, took meds today. Tolerating well, w/o complaints. Denies CP, dyspnea, HA, edema, dizziness / lightheadedness  Osteopenia Due for DEXA 12/2024   Health Maintenance:   Mammogram 12/2023 negative   Discussed Pneumonia vaccine Prevnar-20, she agrees to pursue the vaccine after the holiday.   Decline Flu   Colonoscopy done 05/29/19 next due 5 years 2026     12/20/2023    2:46 PM 08/08/2023    2:31 PM 01/31/2023    9:34 AM  Depression screen PHQ 2/9  Decreased Interest 0 0 0  Down, Depressed, Hopeless 0 0 0  PHQ - 2 Score 0 0 0  Altered sleeping  0 0  Tired, decreased energy  0 0  Change in appetite  0 0  Feeling bad or failure about yourself   0 0  Trouble concentrating  0 0  Moving slowly or fidgety/restless  0 0  Suicidal thoughts  0 0  PHQ-9 Score  0  0   Difficult doing work/chores   Not difficult at all     Data saved with a previous flowsheet row  definition       08/08/2023    2:31 PM 01/31/2023    9:34 AM 06/02/2020   10:45 AM  GAD 7 : Generalized Anxiety Score  Nervous, Anxious, on Edge 0 0 0  Control/stop worrying 0 0 0  Worry too much - different things 0 0 0  Trouble relaxing 0 0 0  Restless 0 0 0  Easily annoyed or irritable 0 0 0  Afraid - awful might happen 0 0 0  Total GAD 7 Score 0 0 0  Anxiety Difficulty  Not difficult at all Not difficult at all     Past Medical History:  Diagnosis Date   Asthma    Breast cancer (HCC) 02/2010   GERD (gastroesophageal reflux disease) 2008   Hypertension    Malignant neoplasm of upper-outer quadrant of female breast (HCC) 2011   Intermediate grade DCIS, ER: 50%; PR 10%. left breast lumpectomy, wide excision and a repeat wide excision on 03/10/2010 for multiple positive margins on original resection   Malignant neoplasm of upper-outer quadrant of female breast [facility]) January 2012:    Re-excision to negative margins.    Obesity, unspecified    Postmenopausal bleeding 2013   S/P radiation therapy 2012   whole breast,    Special screening for malignant neoplasms, colon    Past Surgical History:  Procedure Laterality Date   APPENDECTOMY  2008   BREAST BIOPSY Left 2011   pt states had stereo done on mobile unit at Dr. Fredirick office. DCIS   BREAST LUMPECTOMY Left 02/2010   lumpectomy with re excision 03/2010 of left breast for cancer and rad tx   BREAST SURGERY Left 02/17/2010;03/10/2010   lumpectomy and repeat wide excision   CESAREAN SECTION  1998   COLON SURGERY Right 06/13/2006   Hand-assisted right hemicolectomy. 5.8 cm villous adenoma without atypia   COLONOSCOPY  2015   Normal exam.   COLONOSCOPY WITH PROPOFOL  N/A 05/29/2019   Procedure: COLONOSCOPY WITH PROPOFOL ;  Surgeon: Jinny Carmine, MD;  Location: [facility] ENDOSCOPY;  Service: Endoscopy;  Laterality: N/A;   ESOPHAGOGASTRODUODENOSCOPY (EGD) WITH PROPOFOL  N/A 06/21/2015   Procedure: ESOPHAGOGASTRODUODENOSCOPY (EGD)  WITH PROPOFOL ;  Surgeon: Deward CINDERELLA Piedmont, MD;  Location: [facility] ENDOSCOPY;  Service: Gastroenterology;  Laterality: N/A;   Social History   Socioeconomic History   Marital status: Married    Spouse name: Not on file   Number of children: Not on file   Years of education: Not on file   Highest education level: Not on file  Occupational History   Not on file  Tobacco Use   Smoking status: Never   Smokeless tobacco: Never  Vaping Use   Vaping status: Never Used  Substance and Sexual Activity   Alcohol use: No   Drug use: No   Sexual activity: Yes    Birth control/protection: Post-menopausal  Other Topics Concern   Not on file  Social History Narrative   Not on file   Social Drivers of Health   Tobacco Use: Low Risk (02/19/2024)   Patient History    Smoking Tobacco Use: Never  Smokeless Tobacco Use: Never    Passive Exposure: Not on file  Financial Resource Strain: Low Risk (12/20/2023)   Overall Financial Resource Strain (CARDIA)    Difficulty of Paying Living Expenses: Not hard at all  Food Insecurity: No Food Insecurity (12/20/2023)   Epic    Worried About Programme Researcher, Broadcasting/film/video in the Last Year: Never true    Ran Out of Food in the Last Year: Never true  Transportation Needs: No Transportation Needs (12/20/2023)   Epic    Lack of Transportation (Medical): No    Lack of Transportation (Non-Medical): No  Physical Activity: Inactive (12/20/2023)   Exercise Vital Sign    Days of Exercise per Week: 0 days    Minutes of Exercise per Session: 0 min  Stress: No Stress Concern Present (12/20/2023)   [employer] of Occupational Health - Occupational Stress Questionnaire    Feeling of Stress: Not at all  Social Connections: Socially Integrated (12/20/2023)   Social Connection and Isolation Panel    Frequency of Communication with Friends and Family: More than three times a week    Frequency of Social Gatherings with Friends and Family: More than three times a week    Attends  Religious Services: More than 4 times per year    Active Member of [employer] or Organizations: Yes    Attends Banker Meetings: More than 4 times per year    Marital Status: Married  Catering Manager Violence: Not At Risk (12/20/2023)   Epic    Fear of Current or Ex-Partner: No    Emotionally Abused: No    Physically Abused: No    Sexually Abused: No  Depression (PHQ2-9): Low Risk (12/20/2023)   Depression (PHQ2-9)    PHQ-2 Score: 0  Alcohol Screen: Low Risk (12/20/2023)   Alcohol Screen    Last Alcohol Screening Score (AUDIT): 0  Housing: Unknown (12/20/2023)   Epic    Unable to Pay for Housing in the Last Year: No    Number of Times Moved in the Last Year: Not on file    Homeless in the Last Year: No  Utilities: Not At Risk (12/20/2023)   Epic    Threatened with loss of utilities: No  Health Literacy: Adequate Health Literacy (12/20/2023)   B1300 Health Literacy    Frequency of need for help with medical instructions: Never   Family History  Problem Relation Age of Onset   Hypertension Father    Cancer Father        colon   Hypertension Brother    Cancer Other        unknown family member with breast cancer   Colon polyps Other        unknown family member with colon polyps   Breast cancer Neg Hx    Current Outpatient Medications on File Prior to Visit  Medication Sig   albuterol (PROVENTIL HFA;VENTOLIN HFA) 108 (90 Base) MCG/ACT inhaler Inhale 2 puffs into the lungs every 6 (six) hours as needed for wheezing or shortness of breath.   amLODipine  (NORVASC ) 2.5 MG tablet Take 1 tablet (2.5 mg total) by mouth 2 (two) times daily.   aspirin 81 MG tablet Take 81 mg by mouth daily.   carvedilol  (COREG ) 25 MG tablet Take 1 tablet (25 mg total) by mouth 2 (two) times daily with a meal.   chlorthalidone  (HYGROTON ) 25 MG tablet Take 0.5 tablets (12.5 mg total) by mouth daily.   Cholecalciferol (VITAMIN D3) 50 MCG (2000 UT)  capsule Take 2 capsules (4,000 Units total) by  mouth daily.   EPINEPHrine 0.3 mg/0.3 mL IJ SOAJ injection See admin instructions.   fluticasone (FLONASE) 50 MCG/ACT nasal spray 1-2 sprays   fluticasone furoate-vilanterol (BREO ELLIPTA) 100-25 MCG/ACT AEPB Inhale 1 puff into the lungs daily.   fluticasone furoate-vilanterol (BREO ELLIPTA) 100-25 MCG/ACT AEPB Inhale 1 puff into the lungs daily.   halobetasol  (ULTRAVATE ) 0.05 % ointment Apply topically 2 (two) times daily. As needed for dermatitis   loratadine (CLARITIN) 10 MG tablet Take 10 mg by mouth daily.   Magnesium  Glycinate 100 MG CAPS Take 200 mg by mouth daily.   montelukast  (SINGULAIR ) 10 MG tablet TAKE 1 TABLET(10 MG) BY MOUTH AT BEDTIME   pantoprazole  (PROTONIX ) 40 MG tablet Take 1 tablet (40 mg total) by mouth daily before breakfast.   potassium chloride  SA (KLOR-CON  M) 20 MEQ tablet Take 1 tablet (20 mEq total) by mouth daily.   Spacer/Aero-Holding Chambers (AEROCHAMBER MV) inhaler See admin instructions.   budesonide-formoterol (SYMBICORT) 80-4.5 MCG/ACT inhaler Inhale 2 puffs into the lungs 2 (two) times daily. (Patient not taking: Reported on 02/19/2024)   No current facility-administered medications on file prior to visit.    Review of Systems  Constitutional:  Negative for activity change, appetite change, chills, diaphoresis, fatigue and fever.  HENT:  Negative for congestion and hearing loss.   Eyes:  Negative for visual disturbance.  Respiratory:  Negative for cough, chest tightness, shortness of breath and wheezing.   Cardiovascular:  Negative for chest pain, palpitations and leg swelling.  Gastrointestinal:  Negative for abdominal pain, constipation, diarrhea, nausea and vomiting.  Genitourinary:  Negative for dysuria, frequency and hematuria.  Musculoskeletal:  Negative for arthralgias and neck pain.  Skin:  Negative for rash.  Neurological:  Negative for dizziness, weakness, light-headedness, numbness and headaches.  Hematological:  Negative for adenopathy.   Psychiatric/Behavioral:  Negative for behavioral problems, dysphoric mood and sleep disturbance.    Per HPI unless specifically indicated above     Objective:    BP 130/78 (BP Location: Right Arm, Patient Position: Sitting, Cuff Size: Normal)   Pulse 72   Ht 5' (1.524 m)   Wt 149 lb 4 oz (67.7 kg)   SpO2 95%   BMI 29.15 kg/m   Wt Readings from Last 3 Encounters:  02/19/24 149 lb 4 oz (67.7 kg)  12/20/23 145 lb (65.8 kg)  08/18/23 145 lb 2 oz (65.8 kg)    Physical Exam Vitals and nursing note reviewed.  Constitutional:      General: She is not in acute distress.    Appearance: She is well-developed. She is not diaphoretic.     Comments: Well-appearing, comfortable, cooperative  HENT:     Head: Normocephalic and atraumatic.  Eyes:     General:        Right eye: No discharge.        Left eye: No discharge.     Conjunctiva/sclera: Conjunctivae normal.     Pupils: Pupils are equal, round, and reactive to light.  Neck:     Thyroid : No thyromegaly.  Cardiovascular:     Rate and Rhythm: Normal rate and regular rhythm.     Pulses: Normal pulses.     Heart sounds: Normal heart sounds. No murmur heard. Pulmonary:     Effort: Pulmonary effort is normal. No respiratory distress.     Breath sounds: Normal breath sounds. No wheezing or rales.  Abdominal:     General: Bowel sounds are normal.  There is no distension.     Palpations: Abdomen is soft. There is no mass.     Tenderness: There is no abdominal tenderness.     Hernia: A hernia (Previously measured moderate sized 3-5 cm hernia defect L anterior abdominal wall adjacent to umbilicus) is present.  Musculoskeletal:        General: No tenderness. Normal range of motion.     Cervical back: Normal range of motion and neck supple.     Comments: Upper / Lower Extremities: - Normal muscle tone, strength bilateral upper extremities 5/5, lower extremities 5/5  Lymphadenopathy:     Cervical: No cervical adenopathy.  Skin:     General: Skin is warm and dry.     Findings: No erythema or rash.  Neurological:     Mental Status: She is alert and oriented to person, place, and time.     Comments: Distal sensation intact to light touch all extremities  Psychiatric:        Mood and Affect: Mood normal.        Behavior: Behavior normal.        Thought Content: Thought content normal.     Comments: Well groomed, good eye contact, normal speech and thoughts     I have personally reviewed the radiology report from 02/06/23 on CT Coronary Calcium .   ADDENDUM REPORT: 02/17/2023 12:28   EXAM: OVER-READ INTERPRETATION  CT CHEST   The following report is an over-read performed by radiologist Dr. Toribio Cove [facility] on 02/17/2023. This over-read does not include interpretation of cardiac or coronary anatomy or pathology. The coronary calcium  score interpretation by the cardiologist is attached.   COMPARISON:  None available.   FINDINGS: Within the visualized portions of the thorax there are no suspicious appearing pulmonary nodules or masses, there is no acute consolidative airspace disease, no pleural effusions, no pneumothorax and no lymphadenopathy. Atherosclerotic calcifications in the thoracic aorta. Visualized portions of the upper abdomen are unremarkable. There are no aggressive appearing lytic or blastic lesions noted in the visualized portions of the skeleton.   IMPRESSION: 1. Aortic Atherosclerosis (ICD10-I70.0).     Electronically Signed   By: Toribio Aye M.D.   On: 02/17/2023 12:28    Addended by Aye Toribio ORN, MD on 02/17/2023 12:30 PM    Study Result  Narrative & Impression  CLINICAL DATA:  Risk stratification   EXAM: Coronary Calcium  Score   TECHNIQUE: The patient was scanned on a Siemens Somatom scanner. Axial non-contrast 3 mm slices were carried out through the heart. The data set was analyzed on a dedicated work station and scored using the  Agatson method.   FINDINGS: Non-cardiac: See separate report from [facility].   Ascending Aorta: Normal size   Pericardium: Normal   Coronary arteries: Normal origin of left and right coronary arteries. Distribution of arterial calcifications if present, as noted below;   LM 0   LAD 176   LCx 0   RCA 0   Total 176   IMPRESSION AND RECOMMENDATION: 1. Coronary calcium  score of 176. This was 82nd percentile for age and sex matched control.   2. CAC 1-99 in LAD. CAC-DRS A1/N1.   3. Recommend aspirin and statin if no contraindication.   4. Continue heart healthy lifestyle and risk factor modification.   Electronically Signed: By: Redell Cave M.D. On: 02/07/2023 16:21     Results for orders placed or performed in visit on 02/12/24  Vitamin B12   Collection Time:  02/12/24 10:30 AM  Result Value Ref Range   Vitamin B-12 871 200 - 1,100 pg/mL  Comprehensive metabolic panel with GFR   Collection Time: 02/12/24 10:30 AM  Result Value Ref Range   Glucose, Bld 91 65 - 99 mg/dL   BUN 14 7 - 25 mg/dL   Creat 9.36 9.39 - 8.99 mg/dL   eGFR 95 > OR = 60 fO/fpw/8.26f7   BUN/Creatinine Ratio SEE NOTE: 6 - 22 (calc)   Sodium 141 135 - 146 mmol/L   Potassium 4.1 3.5 - 5.3 mmol/L   Chloride 103 98 - 110 mmol/L   CO2 31 20 - 32 mmol/L   Calcium  9.4 8.6 - 10.4 mg/dL   Total Protein 6.7 6.1 - 8.1 g/dL   Albumin 4.0 3.6 - 5.1 g/dL   Globulin 2.7 1.9 - 3.7 g/dL (calc)   AG Ratio 1.5 1.0 - 2.5 (calc)   Total Bilirubin 0.6 0.2 - 1.2 mg/dL   Alkaline phosphatase (APISO) 61 37 - 153 U/L   AST 16 10 - 35 U/L   ALT 13 6 - 29 U/L  CBC with Differential/Platelet   Collection Time: 02/12/24 10:30 AM  Result Value Ref Range   WBC 5.0 3.8 - 10.8 Thousand/uL   RBC 4.98 3.80 - 5.10 Million/uL   Hemoglobin 14.1 11.7 - 15.5 g/dL   HCT 56.3 64.0 - 53.9 %   MCV 87.6 81.4 - 101.7 fL   MCH 28.3 27.0 - 33.0 pg   MCHC 32.3 31.6 - 35.4 g/dL   RDW 86.9 88.9 - 84.9 %    Platelets 226 140 - 400 Thousand/uL   MPV 11.5 7.5 - 12.5 fL   Neutro Abs 1,730 1,500 - 7,800 cells/uL   Absolute Lymphocytes 2,390 850 - 3,900 cells/uL   Absolute Monocytes 495 200 - 950 cells/uL   Eosinophils Absolute 335 15 - 500 cells/uL   Basophils Absolute 50 0 - 200 cells/uL   Neutrophils Relative % 34.6 %   Total Lymphocyte 47.8 %   Monocytes Relative 9.9 %   Eosinophils Relative 6.7 %   Basophils Relative 1.0 %  Hemoglobin A1c   Collection Time: 02/12/24 10:30 AM  Result Value Ref Range   Hgb A1c MFr Bld 5.7 (H) <5.7 %   Mean Plasma Glucose 117 mg/dL   eAG (mmol/L) 6.5 mmol/L  Lipid panel   Collection Time: 02/12/24 10:30 AM  Result Value Ref Range   Cholesterol 209 (H) <200 mg/dL   HDL 64 > OR = 50 mg/dL   Triglycerides 91 <849 mg/dL   LDL Cholesterol (Calc) 126 (H) mg/dL (calc)   Total CHOL/HDL Ratio 3.3 <5.0 (calc)   Non-HDL Cholesterol (Calc) 145 (H) <130 mg/dL (calc)  TSH   Collection Time: 02/12/24 10:30 AM  Result Value Ref Range   TSH 1.87 0.40 - 4.50 mIU/L      Assessment & Plan:   Problem List Items Addressed This Visit     Coronary artery disease of native artery of native heart with stable angina pectoris   Drug-induced myopathy   Essential hypertension   Gastroesophageal reflux disease without esophagitis   History of left mastectomy   Mixed hyperlipidemia   Pre-diabetes   Ventral hernia without obstruction or gangrene   Other Visit Diagnoses       Annual physical exam    -  Primary     Situational anxiety            Updated Health Maintenance information Reviewed recent lab results  with patient Encouraged improvement to lifestyle with diet and exercise Goal of weight loss    Adult Wellness Visit Annual physical examination conducted. Weight decreased by 10 pounds. A1c improved to 5.7. LDL cholesterol stable at 873 mg/dL. Thyroid , blood counts, kidney, liver, and vitamin B12 levels normal. Mammogram normal. Bone density scan in  January 2023. - Continue current weight management strategies. - Continue annual mammograms.  Coronary artery disease with mixed hyperlipidemia LDL cholesterol at 126 mg/dL, stable. Goal to reduce LDL to less than 100 mg/dL, ideally 55 mg/dL. With CAD history  Statins caused muscle aches. Currently taking red yeast rice. Discussed Repatha as a potential treatment option, noting efficacy and no side effects. Cost is a concern, but savings programs may help. She prefers to wait six months to consider Repatha further. - Continue red yeast rice. - Consider Repatha after six months if LDL goals are not met.  Pre-diabetes A1c improved to 5.7. Strong family history of diabetes. Current lifestyle modifications effective. - Continue current lifestyle modifications to manage blood sugar levels.  Essential hypertension Blood pressure managed with carvedilol . Situational anxiety causing temporary spikes.  Carvedilol  25mg  BIDm Amlodipine  2.5mg  TWICE A DAY, Chlorthalidone  12.5mg  (half of 25mg ) daily On potassium  Ventral / Surgical hernia Chronic previously evaluated problem, now with some reported Increased discomfort and changes in hernia. No active pain. Reducible. Recommend General Surgery referral if / when she is ready to address this problem. Not GI specialist. - Schedule colonoscopy by March 2026. As expected. Then she is considering pursuing Hernia eval - Monitor hernia symptoms and seek medical attention if symptoms worsen.  Situational anxiety Experiences anxiety during public speaking, increasing blood pressure and heart rate.  Reviewed options for medication.   - Consider Buspar for short-term management of situational anxiety. - Consider taking an extra half dose of carvedilol  during stressful events. I advised her that in patients not on chronic higher dose beta blockers, yes that beta blockers propranolol / metoprolol / atenolol can be used for AS NEEDED use for anxiety and calming  effect, however she may not benefit as much given already on daily beta blocker      No orders of the defined types were placed in this encounter.   No orders of the defined types were placed in this encounter.    Follow up plan: Return in about 6 months (around 08/19/2024) for 6 month PreDM A1c.  Marsa Officer, DO [facility] 02/19/2024, 1:36 PM

## 2024-02-19 NOTE — Patient Instructions (Addendum)
 Thank you for coming to the office today.    Please schedule a Follow-up Appointment to: Return in about 6 months (around 08/19/2024) for 6 month PreDM A1c.  If you have any other questions or concerns, please feel free to call the office or send a message through MyChart. You may also schedule an earlier appointment if necessary.  Additionally, you may be receiving a survey about your experience at our office within a few days to 1 week by e-mail or mail. We value your feedback.  Marsa Officer, DO [facility], [place]

## 2024-03-30 ENCOUNTER — Other Ambulatory Visit: Payer: Self-pay | Admitting: Family Medicine

## 2024-03-30 DIAGNOSIS — K219 Gastro-esophageal reflux disease without esophagitis: Secondary | ICD-10-CM

## 2024-03-30 DIAGNOSIS — I10 Essential (primary) hypertension: Secondary | ICD-10-CM

## 2024-03-31 NOTE — Telephone Encounter (Signed)
 Requested Prescriptions  Pending Prescriptions Disp Refills   carvedilol  (COREG ) 25 MG tablet [Pharmacy Med Name: CARVEDILOL  25MG  TABLETS] 180 tablet 3    Sig: TAKE 1 TABLET(25 MG) BY MOUTH TWICE DAILY WITH A MEAL     Cardiovascular: Beta Blockers 3 Passed - 03/31/2024  2:05 PM      Passed - Cr in normal range and within 360 days    Creat  Date Value Ref Range Status  02/12/2024 0.63 0.60 - 1.00 mg/dL Final         Passed - AST in normal range and within 360 days    AST  Date Value Ref Range Status  02/12/2024 16 10 - 35 U/L Final   SGOT(AST)  Date Value Ref Range Status  06/24/2013 14 (L) 15 - 37 Unit/L Final         Passed - ALT in normal range and within 360 days    ALT  Date Value Ref Range Status  02/12/2024 13 6 - 29 U/L Final   SGPT (ALT)  Date Value Ref Range Status  06/24/2013 17 12 - 78 U/L Final         Passed - Last BP in normal range    BP Readings from Last 1 Encounters:  02/19/24 130/78         Passed - Last Heart Rate in normal range    Pulse Readings from Last 1 Encounters:  02/19/24 72         Passed - Valid encounter within last 6 months    Recent Outpatient Visits           1 month ago Annual physical exam   [place] [facility] [place], Marsa PARAS, DO   7 months ago Pre-diabetes   [place] [facility] [place], Marsa PARAS, DO               pantoprazole  (PROTONIX ) 40 MG tablet [Pharmacy Med Name: PANTOPRAZOLE  40MG  TABLETS] 90 tablet 3    Sig: TAKE 1 TABLET(40 MG) BY MOUTH DAILY BEFORE BREAKFAST     Gastroenterology: Proton Pump Inhibitors Passed - 03/31/2024  2:05 PM      Passed - Valid encounter within last 12 months    Recent Outpatient Visits           1 month ago Annual physical exam   [place] [facility] [place], Marsa PARAS, DO   7 months ago Pre-diabetes   [place] [facility] [place], Marsa PARAS, [place]

## 2024-04-02 ENCOUNTER — Other Ambulatory Visit: Payer: Self-pay | Admitting: Family Medicine

## 2024-04-02 DIAGNOSIS — Z9012 Acquired absence of left breast and nipple: Secondary | ICD-10-CM

## 2024-08-20 ENCOUNTER — Ambulatory Visit: Admitting: Family Medicine

## 2024-12-26 ENCOUNTER — Ambulatory Visit
